# Patient Record
Sex: Female | Born: 1950 | Race: White | Hispanic: No | Smoking: Never smoker
Health system: Southern US, Community
[De-identification: ages and names within clinical notes are randomized; demographics above are authoritative.]

## PROBLEM LIST (undated history)

## (undated) DIAGNOSIS — E669 Obesity, unspecified: Secondary | ICD-10-CM

## (undated) DIAGNOSIS — I10 Essential (primary) hypertension: Secondary | ICD-10-CM

## (undated) DIAGNOSIS — L940 Localized scleroderma [morphea]: Secondary | ICD-10-CM

## (undated) DIAGNOSIS — I4891 Unspecified atrial fibrillation: Secondary | ICD-10-CM

## (undated) DIAGNOSIS — J45909 Unspecified asthma, uncomplicated: Secondary | ICD-10-CM

## (undated) DIAGNOSIS — Z973 Presence of spectacles and contact lenses: Secondary | ICD-10-CM

## (undated) DIAGNOSIS — E7800 Pure hypercholesterolemia, unspecified: Secondary | ICD-10-CM

## (undated) DIAGNOSIS — N200 Calculus of kidney: Secondary | ICD-10-CM

## (undated) DIAGNOSIS — R519 Headache, unspecified: Secondary | ICD-10-CM

## (undated) DIAGNOSIS — M1990 Unspecified osteoarthritis, unspecified site: Secondary | ICD-10-CM

## (undated) DIAGNOSIS — O24419 Gestational diabetes mellitus in pregnancy, unspecified control: Secondary | ICD-10-CM

## (undated) DIAGNOSIS — R51 Headache: Secondary | ICD-10-CM

## (undated) HISTORY — PX: TUBAL LIGATION: SHX77

## (undated) HISTORY — PX: WISDOM TOOTH EXTRACTION: SHX21

## (undated) HISTORY — DX: Unspecified atrial fibrillation: I48.91

## (undated) HISTORY — PX: FRACTURE SURGERY: SHX138

---

## 2011-09-03 DIAGNOSIS — M1651 Unilateral post-traumatic osteoarthritis, right hip: Secondary | ICD-10-CM

## 2011-09-03 DIAGNOSIS — M25551 Pain in right hip: Secondary | ICD-10-CM

## 2011-09-03 DIAGNOSIS — M76899 Other specified enthesopathies of unspecified lower limb, excluding foot: Secondary | ICD-10-CM

## 2011-09-03 DIAGNOSIS — S32401A Unspecified fracture of right acetabulum, initial encounter for closed fracture: Secondary | ICD-10-CM

## 2011-09-03 HISTORY — DX: Unilateral post-traumatic osteoarthritis, right hip: M16.51

## 2011-09-03 HISTORY — DX: Pain in right hip: M25.551

## 2011-09-03 HISTORY — DX: Unspecified fracture of right acetabulum, initial encounter for closed fracture: S32.401A

## 2011-09-03 HISTORY — DX: Other specified enthesopathies of unspecified lower limb, excluding foot: M76.899

## 2015-03-04 ENCOUNTER — Other Ambulatory Visit: Payer: Self-pay | Admitting: Internal Medicine

## 2015-03-04 DIAGNOSIS — M25551 Pain in right hip: Secondary | ICD-10-CM

## 2015-03-07 ENCOUNTER — Other Ambulatory Visit: Payer: Self-pay | Admitting: Internal Medicine

## 2015-03-07 DIAGNOSIS — M25551 Pain in right hip: Secondary | ICD-10-CM

## 2015-03-08 ENCOUNTER — Ambulatory Visit
Admission: RE | Admit: 2015-03-08 | Discharge: 2015-03-08 | Disposition: A | Discharge status: Home / Self Care | Payer: PPO | Source: Ambulatory Visit | Attending: Internal Medicine | Admitting: Internal Medicine

## 2015-03-08 DIAGNOSIS — M25551 Pain in right hip: Secondary | ICD-10-CM

## 2015-05-24 ENCOUNTER — Other Ambulatory Visit: Payer: Self-pay | Admitting: Physician Assistant

## 2015-05-24 NOTE — H&P (Signed)
TOTAL HIP ADMISSION H&P  Patient is admitted for right total hip arthroplasty.  Subjective:  Chief Complaint: right hip pain  HPI: Teresa Blair, 64 y.o. female, has a history of pain and functional disability in the right hip(s) due to trauma and arthritis and patient has failed non-surgical conservative treatments for greater than 12 weeks to include NSAID's and/or analgesics, corticosteriod injections, use of assistive devices, weight reduction as appropriate and activity modification.  Onset of symptoms was gradual starting >10 years ago with rapidlly worsening course since.   Patient currently rates pain in the right hip at 10 out of 10 with activity. Patient has night pain, worsening of pain with activity and weight bearing, trendelenberg gait, pain that interfers with activities of daily living and pain with passive range of motion. Patient has evidence of subchondral sclerosis, periarticular osteophytes and joint space narrowing by imaging studies. This condition presents safety issues increasing the risk of falls.  There is no current active infection.  There are no active problems to display for this patient.  No past medical history on file.  No past surgical history on file.   (Not in a hospital admission) Allergies not on file  History  Substance Use Topics  . Smoking status: Not on file  . Smokeless tobacco: Not on file  . Alcohol Use: Not on file    No family history on file.   Review of Systems  Constitutional: Negative.   HENT: Negative.   Eyes: Negative.   Respiratory: Negative.   Cardiovascular: Negative.   Gastrointestinal: Negative.   Genitourinary: Negative.   Musculoskeletal: Positive for joint pain.  Skin: Negative.   Neurological: Negative.   Endo/Heme/Allergies: Negative.   Psychiatric/Behavioral: Negative.     Objective:  Physical Exam  Constitutional: She is oriented to person, place, and time. She appears well-developed and well-nourished.   HENT:  Head: Normocephalic and atraumatic.  Eyes: EOM are normal. Pupils are equal, round, and reactive to light.  Neck: Normal range of motion. Neck supple.  Cardiovascular: Normal rate and regular rhythm.  Exam reveals no friction rub.   No murmur heard. Respiratory: Effort normal and breath sounds normal. No respiratory distress. She has no wheezes. She has no rales.  GI: Soft. Bowel sounds are normal.  Musculoskeletal:  Markedly antalgic Trendelenburg gait on the right.  Very little rotation.  Positive log roll.  Really no trochanteric tenderness and no real straight leg raise, but there is some decreased lumbar motion.  Opposite left hip has great motion and no symptoms.  Distally she has just a little bit of weakness with dorsiflexion at 4/5.  Sensation intact.    Neurological: She is alert and oriented to person, place, and time.  Skin: Skin is warm and dry.  Psychiatric: She has a normal mood and affect. Her behavior is normal. Judgment and thought content normal.    Vital signs in last 24 hours: @VSRANGES @  Labs:   There is no height or weight on file to calculate BMI.   Imaging Review Plain radiographs demonstrate severe degenerative joint disease of the right hip(s). The bone quality appears to be fair for age and reported activity level.  Assessment/Plan:  End stage arthritis, right hip(s)  The patient history, physical examination, clinical judgement of the provider and imaging studies are consistent with end stage degenerative joint disease of the right hip(s) and total hip arthroplasty is deemed medically necessary. The treatment options including medical management, injection therapy, arthroscopy and arthroplasty were discussed at  length. The risks and benefits of total hip arthroplasty were presented and reviewed. The risks due to aseptic loosening, infection, stiffness, dislocation/subluxation,  thromboembolic complications and other imponderables were discussed.   The patient acknowledged the explanation, agreed to proceed with the plan and consent was signed. Patient is being admitted for inpatient treatment for surgery, pain control, PT, OT, prophylactic antibiotics, VTE prophylaxis, progressive ambulation and ADL's and discharge planning.The patient is planning to be discharged home with home health services

## 2015-05-26 ENCOUNTER — Encounter (HOSPITAL_COMMUNITY)
Admission: RE | Admit: 2015-05-26 | Discharge: 2015-05-26 | Disposition: A | Discharge status: Home / Self Care | Payer: PPO | Source: Ambulatory Visit | Attending: Orthopedic Surgery | Admitting: Orthopedic Surgery

## 2015-05-26 ENCOUNTER — Encounter (HOSPITAL_COMMUNITY): Payer: Self-pay

## 2015-05-26 DIAGNOSIS — Z01818 Encounter for other preprocedural examination: Secondary | ICD-10-CM | POA: Diagnosis not present

## 2015-05-26 DIAGNOSIS — E780 Pure hypercholesterolemia: Secondary | ICD-10-CM | POA: Insufficient documentation

## 2015-05-26 DIAGNOSIS — Z79899 Other long term (current) drug therapy: Secondary | ICD-10-CM | POA: Diagnosis not present

## 2015-05-26 DIAGNOSIS — J45909 Unspecified asthma, uncomplicated: Secondary | ICD-10-CM | POA: Insufficient documentation

## 2015-05-26 DIAGNOSIS — R9431 Abnormal electrocardiogram [ECG] [EKG]: Secondary | ICD-10-CM | POA: Insufficient documentation

## 2015-05-26 DIAGNOSIS — I10 Essential (primary) hypertension: Secondary | ICD-10-CM | POA: Diagnosis not present

## 2015-05-26 DIAGNOSIS — Z01812 Encounter for preprocedural laboratory examination: Secondary | ICD-10-CM | POA: Diagnosis not present

## 2015-05-26 DIAGNOSIS — Z0183 Encounter for blood typing: Secondary | ICD-10-CM | POA: Diagnosis not present

## 2015-05-26 DIAGNOSIS — M1611 Unilateral primary osteoarthritis, right hip: Secondary | ICD-10-CM | POA: Insufficient documentation

## 2015-05-26 HISTORY — DX: Unspecified asthma, uncomplicated: J45.909

## 2015-05-26 HISTORY — DX: Calculus of kidney: N20.0

## 2015-05-26 HISTORY — DX: Pure hypercholesterolemia, unspecified: E78.00

## 2015-05-26 HISTORY — DX: Localized scleroderma (morphea): L94.0

## 2015-05-26 HISTORY — DX: Presence of spectacles and contact lenses: Z97.3

## 2015-05-26 HISTORY — DX: Gestational diabetes mellitus in pregnancy, unspecified control: O24.419

## 2015-05-26 HISTORY — DX: Unspecified osteoarthritis, unspecified site: M19.90

## 2015-05-26 HISTORY — DX: Essential (primary) hypertension: I10

## 2015-05-26 HISTORY — DX: Headache: R51

## 2015-05-26 HISTORY — DX: Headache, unspecified: R51.9

## 2015-05-26 HISTORY — DX: Obesity, unspecified: E66.9

## 2015-05-26 LAB — CBC WITH DIFFERENTIAL/PLATELET
BASOS ABS: 0 10*3/uL (ref 0.0–0.1)
Basophils Relative: 0 % (ref 0–1)
EOS PCT: 1 % (ref 0–5)
Eosinophils Absolute: 0.1 10*3/uL (ref 0.0–0.7)
HEMATOCRIT: 42.3 % (ref 36.0–46.0)
HEMOGLOBIN: 13.9 g/dL (ref 12.0–15.0)
LYMPHS ABS: 1.3 10*3/uL (ref 0.7–4.0)
Lymphocytes Relative: 20 % (ref 12–46)
MCH: 28.5 pg (ref 26.0–34.0)
MCHC: 32.9 g/dL (ref 30.0–36.0)
MCV: 86.9 fL (ref 78.0–100.0)
Monocytes Absolute: 0.5 10*3/uL (ref 0.1–1.0)
Monocytes Relative: 8 % (ref 3–12)
Neutro Abs: 4.5 10*3/uL (ref 1.7–7.7)
Neutrophils Relative %: 71 % (ref 43–77)
PLATELETS: 220 10*3/uL (ref 150–400)
RBC: 4.87 MIL/uL (ref 3.87–5.11)
RDW: 12.5 % (ref 11.5–15.5)
WBC: 6.4 10*3/uL (ref 4.0–10.5)

## 2015-05-26 LAB — COMPREHENSIVE METABOLIC PANEL
ALBUMIN: 4.1 g/dL (ref 3.5–5.0)
ALK PHOS: 68 U/L (ref 38–126)
ALT: 20 U/L (ref 14–54)
AST: 18 U/L (ref 15–41)
Anion gap: 10 (ref 5–15)
BUN: 12 mg/dL (ref 6–20)
CALCIUM: 9.6 mg/dL (ref 8.9–10.3)
CO2: 28 mmol/L (ref 22–32)
Chloride: 102 mmol/L (ref 101–111)
Creatinine, Ser: 0.59 mg/dL (ref 0.44–1.00)
GFR calc non Af Amer: >60 mL/min (ref >60)
GLUCOSE: 103 mg/dL — AB (ref 65–99)
Potassium: 4.1 mmol/L (ref 3.5–5.1)
SODIUM: 140 mmol/L (ref 135–145)
TOTAL PROTEIN: 7.2 g/dL (ref 6.5–8.1)
Total Bilirubin: 0.5 mg/dL (ref 0.3–1.2)

## 2015-05-26 LAB — PROTIME-INR
INR: 1.01 (ref 0.00–1.49)
Prothrombin Time: 13.5 seconds (ref 11.6–15.2)

## 2015-05-26 LAB — SURGICAL PCR SCREEN
MRSA, PCR: NEGATIVE
Staphylococcus aureus: NEGATIVE

## 2015-05-26 LAB — ABO/RH: ABO/RH(D): A POS

## 2015-05-26 LAB — APTT: aPTT: 28 seconds (ref 24–37)

## 2015-05-26 NOTE — Progress Notes (Signed)
Pt denies SOB, chest pain, and being under the care of a cardiologist. Pt denies having a stress test, echo and cardiac cath. Pt denies having a chest x ray and EKG within the last year. Per Ebony Hail, [place],  okay for pt to have an urine pregnancy test to avoid having labs drawn again; pt stated that she definitely menstruated in April after being deemed post menopausal. Chart forwarded to [place], [place] , anesthesia to review abnormal EKG.

## 2015-05-26 NOTE — Pre-Procedure Instructions (Signed)
Teresa Blair  05/26/2015     No Pharmacies Listed   Your procedure is scheduled on Wednesday, June 08, 2015  Report to [facility] Admitting at 6:30 A.M.  Call this number if you have problems the morning of surgery:  [phone]   Remember:  Do not eat food or drink liquids after midnight Tuesday, June 07, 2015  Take these medicines the morning of surgery with A SIP OF WATER: None   Stop taking Aspirin, vitamins and herbal medications. Do not take any NSAIDs ie: Ibuprofen, Advil, Naproxen or any medication containing Aspirin such as Meloxicam ( Mobic); stop 1 week prior to procedure ( Wednesday, June 01, 2015).   Do not wear jewelry, make-up or nail polish.  Do not wear lotions, powders, or perfumes.  You may not wear deodorant.  Do not shave 48 hours prior to surgery.  Men may shave face and neck.  Do not bring valuables to the hospital.  [facility] is not responsible for any belongings or valuables.  Contacts, dentures or bridgework may not be worn into surgery.  Leave your suitcase in the car.  After surgery it may be brought to your room.  For patients admitted to the hospital, discharge time will be determined by your treatment team.  Patients discharged the day of surgery will not be allowed to drive home.   Name and phone number of your driver:    Special instructions:   Special Instructions:Special Instructions: [facility] - Preparing for Surgery  Before surgery, you can play an important role.  Because skin is not sterile, your skin needs to be as free of germs as possible.  You can reduce the number of germs on you skin by washing with CHG (chlorahexidine gluconate) soap before surgery.  CHG is an antiseptic cleaner which kills germs and bonds with the skin to continue killing germs even after washing.  Please DO NOT use if you have an allergy to CHG or antibacterial soaps.  If your skin becomes reddened/irritated stop using the CHG and inform your  nurse when you arrive at Short Stay.  Do not shave (including legs and underarms) for at least 48 hours prior to the first CHG shower.  You may shave your face.  Please follow these instructions carefully:   1.  Shower with CHG Soap the night before surgery and the morning of Surgery.  2.  If you choose to wash your hair, wash your hair first as usual with your normal shampoo.  3.  After you shampoo, rinse your hair and body thoroughly to remove the Shampoo.  4.  Use CHG as you would any other liquid soap.  You can apply chg directly  to the skin and wash gently with scrungie or a clean washcloth.  5.  Apply the CHG Soap to your body ONLY FROM THE NECK DOWN.  Do not use on open wounds or open sores.  Avoid contact with your eyes, ears, mouth and genitals (private parts).  Wash genitals (private parts) with your normal soap.  6.  Wash thoroughly, paying special attention to the area where your surgery will be performed.  7.  Thoroughly rinse your body with warm water from the neck down.  8.  DO NOT shower/wash with your normal soap after using and rinsing off the CHG Soap.  9.  Pat yourself dry with a clean towel.            10.  Wear clean  pajamas.            11.  Place clean sheets on your bed the night of your first shower and do not sleep with pets.  Day of Surgery  Do not apply any lotions/deodorants the morning of surgery.  Please wear clean clothes to the hospital/surgery center.  Please read over the following fact sheets that you were given. Pain Booklet, Coughing and Deep Breathing, Blood Transfusion Information, Total Joint Packet, MRSA Information and Surgical Site Infection Prevention

## 2015-05-26 NOTE — Progress Notes (Signed)
   05/26/15 7867  OBSTRUCTIVE SLEEP APNEA  Have you ever been diagnosed with sleep apnea through a sleep study? No  Do you snore loudly (loud enough to be heard through closed doors)?  1  Do you often feel tired, fatigued, or sleepy during the daytime? 0  Has anyone observed you stop breathing during your sleep? 0  Do you have, or are you being treated for high blood pressure? 1  BMI more than 35 kg/m2? 1  Age over 62 years old? 1  Neck circumference greater than 40 cm/16 inches? 0  Gender: 0

## 2015-05-27 LAB — URINE CULTURE

## 2015-05-27 NOTE — Progress Notes (Signed)
Anesthesia Chart Review:  Pt is 64 year old female scheduled for R total hip arthroplasty on 06/08/2015 with Dr. Maryla Morrow.   PMH includes: HTN, hypercholesterolemia, asthma, gestational diabetes. Never smoker. BMI 42.  Medications include: losartan, pravastatin.   Preoperative labs reviewed.    EKG 05/26/2015: NSR. Cannot rule out Anterior infarct, age undetermined.   If no changes, I anticipate pt can proceed with surgery as scheduled.   Willeen Cass, FNP-BC [facility]/Anesthesiology Phone: [phone] 05/27/2015 3:04 PM

## 2015-06-07 MED ORDER — LACTATED RINGERS IV SOLN
INTRAVENOUS | Status: DC
  Administered 2015-06-08 (×3): via INTRAVENOUS

## 2015-06-07 MED ORDER — CEFAZOLIN SODIUM-DEXTROSE 2-3 GM-% IV SOLR
2.0000 g | INTRAVENOUS | Status: AC
  Administered 2015-06-08: 2 g via INTRAVENOUS

## 2015-06-08 ENCOUNTER — Inpatient Hospital Stay (HOSPITAL_COMMUNITY)
Admission: RE | Admit: 2015-06-08 | Discharge: 2015-06-10 | DRG: 470 | Disposition: A | Discharge status: Home Health | Payer: PPO | Source: Ambulatory Visit | Attending: Orthopedic Surgery | Admitting: Orthopedic Surgery

## 2015-06-08 ENCOUNTER — Inpatient Hospital Stay (HOSPITAL_COMMUNITY): Payer: PPO | Admitting: Anesthesiology

## 2015-06-08 ENCOUNTER — Inpatient Hospital Stay (HOSPITAL_COMMUNITY): Payer: PPO | Admitting: Emergency Medicine

## 2015-06-08 ENCOUNTER — Inpatient Hospital Stay (HOSPITAL_COMMUNITY): Payer: PPO

## 2015-06-08 ENCOUNTER — Other Ambulatory Visit: Payer: Self-pay | Admitting: Physician Assistant

## 2015-06-08 ENCOUNTER — Encounter (HOSPITAL_COMMUNITY): Payer: Self-pay | Admitting: *Deleted

## 2015-06-08 ENCOUNTER — Encounter (HOSPITAL_COMMUNITY)
Admission: RE | Disposition: A | Discharge status: Home Health | Payer: Self-pay | Source: Ambulatory Visit | Attending: Orthopedic Surgery

## 2015-06-08 DIAGNOSIS — E669 Obesity, unspecified: Secondary | ICD-10-CM | POA: Diagnosis present

## 2015-06-08 DIAGNOSIS — M1611 Unilateral primary osteoarthritis, right hip: Principal | ICD-10-CM | POA: Diagnosis present

## 2015-06-08 DIAGNOSIS — E780 Pure hypercholesterolemia: Secondary | ICD-10-CM | POA: Diagnosis present

## 2015-06-08 DIAGNOSIS — M1610 Unilateral primary osteoarthritis, unspecified hip: Secondary | ICD-10-CM | POA: Diagnosis present

## 2015-06-08 DIAGNOSIS — I10 Essential (primary) hypertension: Secondary | ICD-10-CM | POA: Diagnosis present

## 2015-06-08 DIAGNOSIS — D62 Acute posthemorrhagic anemia: Secondary | ICD-10-CM | POA: Diagnosis not present

## 2015-06-08 DIAGNOSIS — E119 Type 2 diabetes mellitus without complications: Secondary | ICD-10-CM | POA: Diagnosis present

## 2015-06-08 DIAGNOSIS — M25551 Pain in right hip: Secondary | ICD-10-CM | POA: Diagnosis present

## 2015-06-08 DIAGNOSIS — Z79899 Other long term (current) drug therapy: Secondary | ICD-10-CM

## 2015-06-08 DIAGNOSIS — M349 Systemic sclerosis, unspecified: Secondary | ICD-10-CM | POA: Diagnosis present

## 2015-06-08 DIAGNOSIS — Z96649 Presence of unspecified artificial hip joint: Secondary | ICD-10-CM

## 2015-06-08 HISTORY — DX: Unilateral primary osteoarthritis, unspecified hip: M16.10

## 2015-06-08 HISTORY — PX: CONVERSION TO TOTAL HIP: SHX5784

## 2015-06-08 HISTORY — PX: TOTAL HIP ARTHROPLASTY: SHX124

## 2015-06-08 LAB — BLOOD PRODUCT ORDER (VERBAL) VERIFICATION

## 2015-06-08 SURGERY — CONVERSION, PREVIOUS HIP SURGERY, TO TOTAL HIP ARTHROPLASTY
Anesthesia: Monitor Anesthesia Care | Laterality: Right

## 2015-06-08 MED ORDER — PROPOFOL 10 MG/ML IV BOLUS
INTRAVENOUS | Status: DC | PRN
  Administered 2015-06-08: 30 mg via INTRAVENOUS
  Administered 2015-06-08: 40 mg via INTRAVENOUS
  Administered 2015-06-08: 30 mg via INTRAVENOUS
  Administered 2015-06-08: 40 mg via INTRAVENOUS
  Administered 2015-06-08 (×2): 30 mg via INTRAVENOUS
  Administered 2015-06-08: 20 mg via INTRAVENOUS

## 2015-06-08 MED ORDER — OXYCODONE HCL 5 MG PO TABS
ORAL_TABLET | ORAL | Status: AC
  Filled 2015-06-08: qty 1

## 2015-06-08 MED ORDER — OXYCODONE-ACETAMINOPHEN 5-325 MG PO TABS: 1.0000 | ORAL_TABLET | ORAL | Status: DC | PRN

## 2015-06-08 MED ORDER — HYDROMORPHONE HCL 1 MG/ML IJ SOLN
0.5000 mg | INTRAMUSCULAR | Status: DC | PRN
  Filled 2015-06-08: qty 1

## 2015-06-08 MED ORDER — FENTANYL CITRATE (PF) 250 MCG/5ML IJ SOLN
INTRAMUSCULAR | Status: AC
  Filled 2015-06-08: qty 5

## 2015-06-08 MED ORDER — METHOCARBAMOL 500 MG PO TABS
500.0000 mg | ORAL_TABLET | Freq: Four times a day (QID) | ORAL | Status: DC | PRN
Start: 2015-06-08 — End: 2015-06-10
  Administered 2015-06-08 – 2015-06-09 (×2): 500 mg via ORAL
  Filled 2015-06-08 (×2): qty 1

## 2015-06-08 MED ORDER — ONDANSETRON HCL 4 MG/2ML IJ SOLN: 4.0000 mg | Freq: Four times a day (QID) | INTRAMUSCULAR | Status: DC | PRN

## 2015-06-08 MED ORDER — PHENYLEPHRINE HCL 10 MG/ML IJ SOLN
INTRAMUSCULAR | Status: DC | PRN
  Administered 2015-06-08 (×4): 40 ug via INTRAVENOUS

## 2015-06-08 MED ORDER — DEXAMETHASONE SODIUM PHOSPHATE 10 MG/ML IJ SOLN
INTRAMUSCULAR | Status: AC
  Filled 2015-06-08: qty 1

## 2015-06-08 MED ORDER — MIDAZOLAM HCL 2 MG/2ML IJ SOLN
INTRAMUSCULAR | Status: AC
  Filled 2015-06-08: qty 2

## 2015-06-08 MED ORDER — PROPOFOL INFUSION 10 MG/ML OPTIME
INTRAVENOUS | Status: DC | PRN
  Administered 2015-06-08: 75 ug/kg/min via INTRAVENOUS

## 2015-06-08 MED ORDER — SCOPOLAMINE 1 MG/3DAYS TD PT72
MEDICATED_PATCH | TRANSDERMAL | Status: DC | PRN
  Administered 2015-06-08: 1 via TRANSDERMAL

## 2015-06-08 MED ORDER — CHLORHEXIDINE GLUCONATE 4 % EX LIQD: 60.0000 mL | Freq: Once | CUTANEOUS | Status: DC

## 2015-06-08 MED ORDER — TRANEXAMIC ACID 1000 MG/10ML IV SOLN
1000.0000 mg | INTRAVENOUS | Status: AC
  Administered 2015-06-08: 1000 mg via INTRAVENOUS
  Filled 2015-06-08: qty 10

## 2015-06-08 MED ORDER — SCOPOLAMINE 1 MG/3DAYS TD PT72
MEDICATED_PATCH | TRANSDERMAL | Status: AC
  Filled 2015-06-08: qty 1

## 2015-06-08 MED ORDER — 0.9 % SODIUM CHLORIDE (POUR BTL) OPTIME
TOPICAL | Status: DC | PRN
  Administered 2015-06-08: 1000 mL

## 2015-06-08 MED ORDER — METHOCARBAMOL 500 MG PO TABS
ORAL_TABLET | ORAL | Status: AC
  Administered 2015-06-08: 500 mg via ORAL
  Filled 2015-06-08: qty 1

## 2015-06-08 MED ORDER — CEFAZOLIN SODIUM-DEXTROSE 2-3 GM-% IV SOLR
2.0000 g | Freq: Four times a day (QID) | INTRAVENOUS | Status: AC
  Administered 2015-06-08 (×2): 2 g via INTRAVENOUS
  Filled 2015-06-08 (×3): qty 50

## 2015-06-08 MED ORDER — FENTANYL CITRATE (PF) 100 MCG/2ML IJ SOLN
INTRAMUSCULAR | Status: DC | PRN
Start: 2015-06-08 — End: 2015-06-08
  Administered 2015-06-08: 50 ug via INTRAVENOUS
  Administered 2015-06-08 (×2): 100 ug via INTRAVENOUS

## 2015-06-08 MED ORDER — FENTANYL CITRATE (PF) 100 MCG/2ML IJ SOLN
25.0000 ug | INTRAMUSCULAR | Status: DC | PRN
  Administered 2015-06-08 (×4): 50 ug via INTRAVENOUS

## 2015-06-08 MED ORDER — POLYETHYLENE GLYCOL 3350 17 G PO PACK: 17.0000 g | PACK | Freq: Every day | ORAL | Status: DC | PRN

## 2015-06-08 MED ORDER — BISACODYL 5 MG PO TBEC: 5.0000 mg | DELAYED_RELEASE_TABLET | Freq: Every day | ORAL | Status: DC | PRN

## 2015-06-08 MED ORDER — DIPHENHYDRAMINE HCL 12.5 MG/5ML PO ELIX: 12.5000 mg | ORAL_SOLUTION | ORAL | Status: DC | PRN

## 2015-06-08 MED ORDER — ALUM & MAG HYDROXIDE-SIMETH 200-200-20 MG/5ML PO SUSP
30.0000 mL | ORAL | Status: DC | PRN
Start: 2015-06-08 — End: 2015-06-10

## 2015-06-08 MED ORDER — ZOLPIDEM TARTRATE 5 MG PO TABS: 5.0000 mg | ORAL_TABLET | Freq: Every evening | ORAL | Status: DC | PRN

## 2015-06-08 MED ORDER — APIXABAN 2.5 MG PO TABS
2.5000 mg | ORAL_TABLET | Freq: Two times a day (BID) | ORAL | Status: DC
  Administered 2015-06-09 – 2015-06-10 (×3): 2.5 mg via ORAL
  Filled 2015-06-08 (×3): qty 1

## 2015-06-08 MED ORDER — PHENOL 1.4 % MT LIQD: 1.0000 | OROMUCOSAL | Status: DC | PRN

## 2015-06-08 MED ORDER — ONDANSETRON HCL 4 MG/2ML IJ SOLN
INTRAMUSCULAR | Status: DC | PRN
  Administered 2015-06-08: 4 mg via INTRAVENOUS

## 2015-06-08 MED ORDER — OXYCODONE HCL 5 MG PO TABS
ORAL_TABLET | ORAL | Status: AC
  Administered 2015-06-08: 5 mg via ORAL
  Filled 2015-06-08: qty 1

## 2015-06-08 MED ORDER — FENTANYL CITRATE (PF) 100 MCG/2ML IJ SOLN
INTRAMUSCULAR | Status: AC
  Filled 2015-06-08: qty 2

## 2015-06-08 MED ORDER — ONDANSETRON HCL 4 MG/2ML IJ SOLN: 4.0000 mg | Freq: Once | INTRAMUSCULAR | Status: DC | PRN

## 2015-06-08 MED ORDER — DEXAMETHASONE SODIUM PHOSPHATE 10 MG/ML IJ SOLN
10.0000 mg | Freq: Once | INTRAMUSCULAR | Status: DC
  Filled 2015-06-08: qty 1

## 2015-06-08 MED ORDER — POTASSIUM CHLORIDE IN NACL 20-0.9 MEQ/L-% IV SOLN
INTRAVENOUS | Status: DC
  Administered 2015-06-08: 15:00:00 via INTRAVENOUS
  Filled 2015-06-08: qty 1000

## 2015-06-08 MED ORDER — METHOCARBAMOL 500 MG PO TABS: 500.0000 mg | ORAL_TABLET | Freq: Four times a day (QID) | ORAL | Status: DC

## 2015-06-08 MED ORDER — PROPOFOL 10 MG/ML IV BOLUS
INTRAVENOUS | Status: AC
  Filled 2015-06-08: qty 20

## 2015-06-08 MED ORDER — BUPIVACAINE HCL (PF) 0.5 % IJ SOLN
INTRAMUSCULAR | Status: DC | PRN
  Administered 2015-06-08: 10 mL

## 2015-06-08 MED ORDER — ACETAMINOPHEN 325 MG PO TABS: 650.0000 mg | ORAL_TABLET | Freq: Four times a day (QID) | ORAL | Status: DC | PRN

## 2015-06-08 MED ORDER — MAGNESIUM CITRATE PO SOLN: 1.0000 | Freq: Once | ORAL | Status: AC | PRN

## 2015-06-08 MED ORDER — LIDOCAINE HCL (CARDIAC) 20 MG/ML IV SOLN
INTRAVENOUS | Status: AC
  Filled 2015-06-08: qty 5

## 2015-06-08 MED ORDER — METOCLOPRAMIDE HCL 5 MG/ML IJ SOLN: 5.0000 mg | Freq: Three times a day (TID) | INTRAMUSCULAR | Status: DC | PRN

## 2015-06-08 MED ORDER — OXYCODONE HCL 5 MG/5ML PO SOLN: 5.0000 mg | Freq: Once | ORAL | Status: AC | PRN

## 2015-06-08 MED ORDER — MIDAZOLAM HCL 5 MG/5ML IJ SOLN
INTRAMUSCULAR | Status: DC | PRN
  Administered 2015-06-08: 2 mg via INTRAVENOUS

## 2015-06-08 MED ORDER — ONDANSETRON HCL 4 MG PO TABS: 4.0000 mg | ORAL_TABLET | Freq: Three times a day (TID) | ORAL | Status: DC | PRN

## 2015-06-08 MED ORDER — ACETAMINOPHEN 650 MG RE SUPP: 650.0000 mg | Freq: Four times a day (QID) | RECTAL | Status: DC | PRN

## 2015-06-08 MED ORDER — FENTANYL CITRATE (PF) 100 MCG/2ML IJ SOLN
25.0000 ug | INTRAMUSCULAR | Status: DC | PRN
  Administered 2015-06-08: 0.5 ug via INTRAVENOUS

## 2015-06-08 MED ORDER — FENTANYL CITRATE (PF) 100 MCG/2ML IJ SOLN
INTRAMUSCULAR | Status: AC
  Administered 2015-06-08: 50 ug via INTRAVENOUS
  Filled 2015-06-08: qty 2

## 2015-06-08 MED ORDER — OXYCODONE HCL 5 MG PO TABS
5.0000 mg | ORAL_TABLET | ORAL | Status: DC | PRN
  Administered 2015-06-08 (×2): 10 mg via ORAL
  Administered 2015-06-08: 5 mg via ORAL
  Administered 2015-06-09: 10 mg via ORAL
  Administered 2015-06-09: 5 mg via ORAL
  Administered 2015-06-09 – 2015-06-10 (×4): 10 mg via ORAL
  Filled 2015-06-08 (×8): qty 2

## 2015-06-08 MED ORDER — OXYCODONE HCL 5 MG PO TABS
5.0000 mg | ORAL_TABLET | Freq: Once | ORAL | Status: AC | PRN
  Administered 2015-06-08: 5 mg via ORAL

## 2015-06-08 MED ORDER — LOSARTAN POTASSIUM 50 MG PO TABS
100.0000 mg | ORAL_TABLET | Freq: Every day | ORAL | Status: DC
  Administered 2015-06-08 – 2015-06-09 (×2): 100 mg via ORAL
  Filled 2015-06-08 (×3): qty 2

## 2015-06-08 MED ORDER — ONDANSETRON HCL 4 MG PO TABS: 4.0000 mg | ORAL_TABLET | Freq: Four times a day (QID) | ORAL | Status: DC | PRN

## 2015-06-08 MED ORDER — DOCUSATE SODIUM 100 MG PO CAPS
100.0000 mg | ORAL_CAPSULE | Freq: Two times a day (BID) | ORAL | Status: DC
  Administered 2015-06-08 – 2015-06-10 (×5): 100 mg via ORAL
  Filled 2015-06-08 (×5): qty 1

## 2015-06-08 MED ORDER — APIXABAN 2.5 MG PO TABS: ORAL_TABLET | ORAL | Status: DC

## 2015-06-08 MED ORDER — DEXAMETHASONE SODIUM PHOSPHATE 10 MG/ML IJ SOLN
INTRAMUSCULAR | Status: DC | PRN
  Administered 2015-06-08: 10 mg via INTRAVENOUS

## 2015-06-08 MED ORDER — SUCCINYLCHOLINE CHLORIDE 20 MG/ML IJ SOLN
INTRAMUSCULAR | Status: AC
  Filled 2015-06-08: qty 1

## 2015-06-08 MED ORDER — BUPIVACAINE LIPOSOME 1.3 % IJ SUSP
20.0000 mL | INTRAMUSCULAR | Status: AC
  Administered 2015-06-08: 20 mL
  Filled 2015-06-08: qty 20

## 2015-06-08 MED ORDER — ONDANSETRON HCL 4 MG/2ML IJ SOLN
INTRAMUSCULAR | Status: AC
  Filled 2015-06-08: qty 2

## 2015-06-08 MED ORDER — MENTHOL 3 MG MT LOZG: 1.0000 | LOZENGE | OROMUCOSAL | Status: DC | PRN

## 2015-06-08 MED ORDER — OXYCODONE HCL 5 MG PO TABS
ORAL_TABLET | ORAL | Status: AC
  Administered 2015-06-08: 5 mg
  Filled 2015-06-08: qty 1

## 2015-06-08 MED ORDER — METHOCARBAMOL 1000 MG/10ML IJ SOLN
500.0000 mg | Freq: Four times a day (QID) | INTRAVENOUS | Status: DC | PRN
  Filled 2015-06-08: qty 5

## 2015-06-08 MED ORDER — METOCLOPRAMIDE HCL 5 MG PO TABS: 5.0000 mg | ORAL_TABLET | Freq: Three times a day (TID) | ORAL | Status: DC | PRN

## 2015-06-08 MED ORDER — GLYCOPYRROLATE 0.2 MG/ML IJ SOLN
INTRAMUSCULAR | Status: DC | PRN
  Administered 2015-06-08: 0.2 mg via INTRAVENOUS

## 2015-06-08 SURGICAL SUPPLY — 53 items
BLADE SAW SGTL 18X1.27X75 (BLADE) ×2 IMPLANT
BLADE SAW SGTL 18X1.27X75MM (BLADE) ×1
BLADE SURG ROTATE 9660 (MISCELLANEOUS) IMPLANT
CAPT HIP TOTAL 2 ×3 IMPLANT
COVER SURGICAL LIGHT HANDLE (MISCELLANEOUS) ×3 IMPLANT
CUP TRIDENT PSL CLUS F 54 55.8 (Orthopedic Implant) ×3 IMPLANT
DRAPE IMP U-DRAPE 54X76 (DRAPES) ×9 IMPLANT
DRAPE INCISE IOBAN 66X45 STRL (DRAPES) ×3 IMPLANT
DRAPE ORTHO SPLIT 77X108 STRL (DRAPES) ×4
DRAPE PROXIMA HALF (DRAPES) ×3 IMPLANT
DRAPE SURG 17X23 STRL (DRAPES) ×3 IMPLANT
DRAPE SURG ORHT 6 SPLT 77X108 (DRAPES) ×2 IMPLANT
DRAPE U-SHAPE 47X51 STRL (DRAPES) ×3 IMPLANT
DRSG AQUACEL AG ADV 3.5X10 (GAUZE/BANDAGES/DRESSINGS) ×3 IMPLANT
DRSG AQUACEL AG ADV 3.5X14 (GAUZE/BANDAGES/DRESSINGS) ×3 IMPLANT
DURAPREP 26ML APPLICATOR (WOUND CARE) ×3 IMPLANT
ELECT BLADE 4.0 EZ CLEAN MEGAD (MISCELLANEOUS) ×3
ELECT CAUTERY BLADE 6.4 (BLADE) ×3 IMPLANT
ELECT REM PT RETURN 9FT ADLT (ELECTROSURGICAL) ×3
ELECTRODE BLDE 4.0 EZ CLN MEGD (MISCELLANEOUS) ×1 IMPLANT
ELECTRODE REM PT RTRN 9FT ADLT (ELECTROSURGICAL) ×1 IMPLANT
FACESHIELD WRAPAROUND (MASK) ×6 IMPLANT
GLOVE BIOGEL PI IND STRL 7.0 (GLOVE) ×2 IMPLANT
GLOVE BIOGEL PI INDICATOR 7.0 (GLOVE) ×4
GLOVE ECLIPSE 7.0 STRL STRAW (GLOVE) ×9 IMPLANT
GLOVE ORTHO TXT STRL SZ7.5 (GLOVE) ×18 IMPLANT
GOWN STRL REUS W/ TWL LRG LVL3 (GOWN DISPOSABLE) ×3 IMPLANT
GOWN STRL REUS W/ TWL XL LVL3 (GOWN DISPOSABLE) ×1 IMPLANT
GOWN STRL REUS W/TWL LRG LVL3 (GOWN DISPOSABLE) ×6
GOWN STRL REUS W/TWL XL LVL3 (GOWN DISPOSABLE) ×2
KIT BASIN OR (CUSTOM PROCEDURE TRAY) ×3 IMPLANT
KIT ROOM TURNOVER OR (KITS) ×3 IMPLANT
MANIFOLD NEPTUNE II (INSTRUMENTS) ×3 IMPLANT
NDL SAFETY ECLIPSE 18X1.5 (NEEDLE) ×1 IMPLANT
NEEDLE HYPO 18GX1.5 SHARP (NEEDLE) ×2
NS IRRIG 1000ML POUR BTL (IV SOLUTION) ×3 IMPLANT
PACK TOTAL JOINT (CUSTOM PROCEDURE TRAY) ×3 IMPLANT
PACK UNIVERSAL I (CUSTOM PROCEDURE TRAY) ×3 IMPLANT
PAD ARMBOARD 7.5X6 YLW CONV (MISCELLANEOUS) ×6 IMPLANT
SCREW OSTEO 20MM (Screw) ×3 IMPLANT
SUCTION FRAZIER TIP 10 FR DISP (SUCTIONS) ×3 IMPLANT
SUT MNCRL AB 4-0 PS2 18 (SUTURE) ×3 IMPLANT
SUT VIC AB 0 CT1 27 (SUTURE) ×2
SUT VIC AB 0 CT1 27XBRD ANBCTR (SUTURE) ×1 IMPLANT
SUT VIC AB 1 CT1 27 (SUTURE) ×4
SUT VIC AB 1 CT1 27XBRD ANBCTR (SUTURE) ×2 IMPLANT
SUT VIC AB 2-0 CT1 27 (SUTURE) ×4
SUT VIC AB 2-0 CT1 TAPERPNT 27 (SUTURE) ×2 IMPLANT
SYR 50ML LL SCALE MARK (SYRINGE) ×3 IMPLANT
TOWEL OR 17X24 6PK STRL BLUE (TOWEL DISPOSABLE) ×3 IMPLANT
TOWEL OR 17X26 10 PK STRL BLUE (TOWEL DISPOSABLE) ×3 IMPLANT
TRAY FOLEY CATH 14FR (SET/KITS/TRAYS/PACK) IMPLANT
WATER STERILE IRR 1000ML POUR (IV SOLUTION) ×3 IMPLANT

## 2015-06-08 NOTE — Evaluation (Signed)
Physical Therapy Evaluation Patient Details Name: Teresa Blair MRN: [id number] DOB: Aug 01, 1951 Today's Date: 06/08/2015   History of Present Illness  Patient is a 64 y/o female s/p R THA, anterior approach. PMH includes HTN, gestational diabetes, asthma.  Clinical Impression  Patient presents with pain and post surgical deficits RLE s/p R THA. Instructed pt on exercises. Pt will have support from daughter for a few days and then sister at d/c. Pt having a ramp being built and son putting in raised toilet. Pt tolerated short distance ambulation to chair today. Will continue to follow to maximize independence and mobility prior to return home.     Follow Up Recommendations Home health PT;Supervision/Assistance - 24 hour    Equipment Recommendations  None recommended by PT    Recommendations for Other Services       Precautions / Restrictions Precautions Precautions: Anterior Hip Precaution Booklet Issued: No Restrictions Weight Bearing Restrictions: Yes RLE Weight Bearing: Weight bearing as tolerated      Mobility  Bed Mobility Overal bed mobility: Needs Assistance Bed Mobility: Supine to Sit     Supine to sit: Min guard;HOB elevated     General bed mobility comments: Cues for technique. Use of rails.  Transfers Overall transfer level: Needs assistance Equipment used: Rolling walker (2 wheeled) Transfers: Sit to/from Stand Sit to Stand: Min guard         General transfer comment: Min guard for safety. VC's for hand placement.   Ambulation/Gait Ambulation/Gait assistance: Min guard Ambulation Distance (Feet): 8 Feet Assistive device: Rolling walker (2 wheeled) Gait Pattern/deviations: Step-to pattern;Decreased stance time - right;Decreased step length - left;Trunk flexed   Gait velocity interpretation: <1.8 ft/sec, indicative of risk for recurrent falls General Gait Details: Pt with slow, unsteady gait wtih increased WB through BUEs. No knee  buckling.  Stairs            Wheelchair Mobility    Modified Rankin (Stroke Patients Only)       Balance Overall balance assessment: Needs assistance Sitting-balance support: Feet supported;No upper extremity supported Sitting balance-Leahy Scale: Good     Standing balance support: During functional activity Standing balance-Leahy Scale: Poor Standing balance comment: Relient on RW for support. Pt incontinent of urine upon standing.                             Pertinent Vitals/Pain Pain Assessment: Faces Faces Pain Scale: Hurts a little bit Pain Location: right hip Pain Descriptors / Indicators: Sore Pain Intervention(s): Monitored during session;Repositioned    Home Living Family/patient expects to be discharged to:: Private residence Living Arrangements: Children;Other relatives Available Help at Discharge: Family;Available 24 hours/day (Dgt will stay for a few days and then 29 y/o sister) Type of Home: House Home Access: Ramped entrance     Home Layout: One level Home Equipment: Walker - 2 wheels      Prior Function Level of Independence: Independent with assistive device(s)         Comments: Pt using SPC PTA. Works as Pharmacist, hospital.     Hand Dominance        Extremity/Trunk Assessment   Upper Extremity Assessment: Defer to OT evaluation           Lower Extremity Assessment: RLE deficits/detail RLE Deficits / Details: Able to perform LAQ. Limited strength/AROM hip flexion secondary to surgery and pain.       Communication   Communication: No difficulties  Cognition Arousal/Alertness: Awake/alert  Behavior During Therapy: WFL for tasks assessed/performed Overall Cognitive Status: Within Functional Limits for tasks assessed                      General Comments      Exercises Total Joint Exercises Ankle Circles/Pumps: Both;10 reps;Supine Quad Sets: Both;10 reps;Supine Gluteal Sets: Both;10 reps;Supine       Assessment/Plan    PT Assessment Patient needs continued PT services  PT Diagnosis Difficulty walking;Acute pain   PT Problem List Decreased strength;Pain;Impaired sensation;Decreased activity tolerance;Decreased range of motion;Decreased mobility;Decreased balance  PT Treatment Interventions Balance training;Gait training;Functional mobility training;Therapeutic activities;Therapeutic exercise;Patient/family education;DME instruction   PT Goals (Current goals can be found in the Care Plan section) Acute Rehab PT Goals Patient Stated Goal: to be able to walk again PT Goal Formulation: With patient Time For Goal Achievement: 06/22/15 Potential to Achieve Goals: Good    Frequency 7X/week   Barriers to discharge        Co-evaluation               End of Session Equipment Utilized During Treatment: Gait belt Activity Tolerance: Patient tolerated treatment well Patient left: in chair;with call bell/phone within reach Nurse Communication: Mobility status         Time: 8329-1916 PT Time Calculation (min) (ACUTE ONLY): 23 min   Charges:   PT Evaluation $Initial PT Evaluation Tier I: 1 Procedure PT Treatments $Therapeutic Activity: 8-22 mins   PT G Codes:        Teresa Blair A Deangela Randleman 06/08/2015, 4:54 PM Wray Kearns, [place], DPT [phone]

## 2015-06-08 NOTE — Anesthesia Postprocedure Evaluation (Signed)
  Anesthesia Post-op Note  Patient: Teresa Blair  Procedure(s) Performed: Procedure(s): CONVERSION TO TOTAL HIP, ANTERIOR TOTAL HIP (Right)  Patient Location: PACU  Anesthesia Type:Spinal  Level of Consciousness: awake, alert  and oriented  Airway and Oxygen Therapy: Patient Spontanous Breathing  Post-op Pain: mild  Post-op Assessment: Post-op Vital signs reviewed, Patient's Cardiovascular Status Stable, Respiratory Function Stable, Patent Airway and Spinal receding LLE Motor Response: Responds to commands, Purposeful movement LLE Sensation: Full sensation RLE Motor Response: Purposeful movement, Responds to commands RLE Sensation: Full sensation L Sensory Level: S3-Medial thigh R Sensory Level: S3-Medial thigh  Post-op Vital Signs: stable  Last Vitals:  Filed Vitals:   06/08/15 1215  BP: 119/68  Pulse: 60  Temp:   Resp: 14    Complications: No apparent anesthesia complications

## 2015-06-08 NOTE — Op Note (Signed)
Teresa Blair, Teresa Blair NO.:  [id number]  MEDICAL RECORD NO.:  [id number]  LOCATION:  5N02C                        FACILITY:  [facility]  PHYSICIAN:  Ninetta Lights, M.D. DATE OF BIRTH:  [birth year]  DATE OF PROCEDURE:  06/08/2015 DATE OF DISCHARGE:                              OPERATIVE REPORT   PREOPERATIVE DIAGNOSES:  Right hip end-stage arthritis, markedly restricted motion.  Secondary to major trauma with open reduction and internal fixation of acetabular fracture.  Retained hardware, plate and screws on the posterior aspect of the acetabulum.  POSTOPERATIVE DIAGNOSES:  Right hip end-stage arthritis, markedly restricted motion.  Secondary to major trauma with open reduction and internal fixation of acetabular fracture.  Retained hardware, plate and screws on the posterior aspect of the acetabulum.  PROCEDURE:  Conversion of right hip to total hip replacement, direct anterior approach, utilizing [employer] Accolade components.  A press-fit 54-mm acetabular component screw fixation x1, 36-mm internal diameter liner.  Press-fit #5 Accolade stem, 36+ 0 BIOLOX head.  SURGEON:  Ninetta Lights, M.D.  ASSISTANT:  Elmyra Ricks, PA, present throughout the entire case and necessary for timely completion of procedure.  ANESTHESIA:  Spinal.  BLOOD LOSS:  300 mL.  BLOOD GIVEN:  None.  SPECIMENS:  None.  CULTURES:  None.  COMPLICATIONS:  None.  DRESSINGS:  Soft compressive.  DESCRIPTION OF PROCEDURE:  The patient was brought to the operating room and placed on the operating table in supine position.  After adequate anesthesia had been obtained, appropriate positioning, padding support, prepped and draped for anterior hip replacement.  Longitudinal anterior incision.  Fascia was exposed and incised.  Tensor retracted anteriorly. The deep fascia markedly thickened, redundant capsule, all completely excised.  Retractor was put in place.  Femoral neck cut 1  fingerbreadth above the lesser trochanter with the head removed.  Marked, scarred in, redundant labrum, all debrided.  Able to expose the acetabulum, bring it up to good bleeding bone avoiding the posterior hardware.  Fitted with a 54-mm cup at 40 degrees of abduction.  I could get one screw through the cup.  The other screw holes keep running into hardware.  I did have solid stable fixation.  A 36-mm internal diameter liner was inserted. Proximal femur was exposed.  Soft tissue release posteriorly to allow this.  Brought up to good sizing and fitting with a #5 component.  After the trials, a #5 component was seated with a 36+ 0 BIOLOX head.  With this construct, we had great stability, full motion, equal leg lengths. Wound was irrigated and injected with Exparel.  Fascia was closed with Vicryl.  Subcutaneous and subcuticular closure.  Margins were injected with Marcaine.  Sterile compressive dressing applied.  Anesthesia was reversed.  Brought to the recovery room.  Tolerated the surgery well. No complications.     Ninetta Lights, M.D.     DFM/MEDQ  D:  06/08/2015  T:  06/08/2015  Job:  [id number]

## 2015-06-08 NOTE — Transfer of Care (Signed)
Immediate Anesthesia Transfer of Care Note  Patient: Teresa Blair  Procedure(s) Performed: Procedure(s): CONVERSION TO TOTAL HIP, ANTERIOR TOTAL HIP (Right)  Patient Location: PACU  Anesthesia Type:Spinal  Level of Consciousness: awake, alert , oriented and patient cooperative  Airway & Oxygen Therapy: Patient Spontanous Breathing and Patient connected to face mask oxygen  Post-op Assessment: Report given to RN, Post -op Vital signs reviewed and stable and Patient moving all extremities X 4  Post vital signs: stable  Last Vitals:  Filed Vitals:   06/08/15 1100  BP:   Pulse:   Temp: 36.3 C  Resp:     Complications: No apparent anesthesia complications

## 2015-06-08 NOTE — Discharge Summary (Addendum)
Patient ID: Teresa Blair MRN: [id number] DOB/AGE: 1951-03-25 64 y.o.  Admit date: 06/08/2015 Discharge date: 06/10/2015  Admission Diagnoses:  Active Problems:   Primary localized osteoarthrosis of pelvic region   Discharge Diagnoses:  Same  Past Medical History  Diagnosis Date  . Osteoarthritis   . Hypercholesterolemia   . Hypertension   . Wears glasses   . Gestational diabetes   . Asthma     as a child  . Kidney stones   . Headache   . Obesity   . Scleroderma, localized     Surgeries: Procedure(s): CONVERSION TO TOTAL HIP, ANTERIOR TOTAL HIP on 06/08/2015   Consultants:    Discharged Condition: Improved  Hospital Course: Teresa Blair is an 64 y.o. female who was admitted 06/08/2015 for operative treatment of primary localized osteoarthritis right hip. Patient has severe unremitting pain that affects sleep, daily activities, and work/hobbies. After pre-op clearance the patient was taken to the operating room on 06/08/2015 and underwent  Procedure(s): CONVERSION TO TOTAL HIP, ANTERIOR TOTAL HIP.  Patient with a pre-op Hb of 13.9 developed ABLA on pod #1 with a Hb of 10.6 and a Hb of 9.6 on pod#2.  She is currently asymptomatic but we will continue to follow.  Patient was given perioperative antibiotics:      Anti-infectives    Start     Dose/Rate Route Frequency Ordered Stop   06/08/15 1500  ceFAZolin (ANCEF) IVPB 2 g/50 mL premix     2 g 100 mL/hr over 30 Minutes Intravenous Every 6 hours 06/08/15 1444 06/08/15 2153   06/08/15 0800  ceFAZolin (ANCEF) IVPB 2 g/50 mL premix     2 g 100 mL/hr over 30 Minutes Intravenous To ShortStay Surgical 06/07/15 1315 06/08/15 0845       Patient was given sequential compression devices, early ambulation, and chemoprophylaxis to prevent DVT.  Patient benefited maximally from hospital stay and there were no complications.    Recent vital signs:  Patient Vitals for the past 24 hrs:  BP Temp Temp src Pulse Resp SpO2   06/09/15 2100 (!) 124/44 mmHg 98.7 F (37.1 C) Oral 82 19 96 %  06/09/15 1300 (!) 127/41 mmHg 99.6 F (37.6 C) - 77 20 97 %     Recent laboratory studies:   Recent Labs  06/09/15 0413 06/10/15 0435  WBC 12.7* 10.6*  HGB 10.6* 9.6*  HCT 32.3* 29.4*  PLT 210 159  NA 137 139  K 5.1 4.1  CL 102 105  CO2 27 30  BUN 8 10  CREATININE 0.65 0.48  GLUCOSE 101* 131*  CALCIUM 8.5* 8.8*     Discharge Medications:     Medication List    STOP taking these medications        meloxicam 15 MG tablet  Commonly known as:  MOBIC      TAKE these medications        apixaban 2.5 MG Tabs tablet  Commonly known as:  ELIQUIS  TAKE ONE TAB TWICE DAILY X 14 DAYS FOLLOWING SURGERY TO PREVENT BLOOD CLOTS     bisacodyl 5 MG EC tablet  Commonly known as:  DULCOLAX  Take 1 tablet (5 mg total) by mouth daily as needed for moderate constipation.     losartan 100 MG tablet  Commonly known as:  COZAAR  Take 1 tablet by mouth daily.     methocarbamol 500 MG tablet  Commonly known as:  ROBAXIN  Take 1 tablet (500 mg total) by mouth  4 (four) times daily.     ondansetron 4 MG tablet  Commonly known as:  ZOFRAN  Take 1 tablet (4 mg total) by mouth every 8 (eight) hours as needed for nausea or vomiting.     oxyCODONE-acetaminophen 5-325 MG per tablet  Commonly known as:  ROXICET  Take 1-2 tablets by mouth every 4 (four) hours as needed.     pravastatin 40 MG tablet  Commonly known as:  PRAVACHOL  Take 20 mg by mouth daily.        Diagnostic Studies: Dg Hip Port Unilat With Pelvis 1v Right  06/08/2015   CLINICAL DATA:  Postop right total hip arthroplasty.  EXAM: DG HIP (WITH OR WITHOUT PELVIS) 1V PORT RIGHT  COMPARISON:  None.  FINDINGS: AP view of the lower pelvis and cross-table lateral view of the right hip submitted. Patient is status post right total hip arthroplasty and plate and screw fixation of the right acetabulum. No evidence of acute fracture or dislocation. The hip joint is  not well visualized in the lateral projection due to body habitus.  IMPRESSION: No demonstrated complication following right total hip arthroplasty and acetabular ORIF.   Electronically Signed   By: Richardean Sale M.D.   On: 06/08/2015 11:37    Disposition: Final discharge disposition not confirmed    Follow-up Information    Follow up with [facility], MD. Schedule an appointment as soon as possible for a visit in 2 weeks.   Specialty:  Orthopedic Surgery   Contact information:   [place] [address] [phone]        Signed: Fannie Knee 06/10/2015, 7:41 AM

## 2015-06-08 NOTE — Anesthesia Procedure Notes (Signed)
Spinal Patient location during procedure: OR Start time: 06/08/2015 8:40 AM End time: 06/08/2015 8:45 AM Staffing Performed by: anesthesiologist  Preanesthetic Checklist Completed: patient identified, site marked, surgical consent, pre-op evaluation, timeout performed, IV checked, risks and benefits discussed and monitors and equipment checked Spinal Block Patient position: right lateral decubitus Prep: ChloraPrep Patient monitoring: heart rate, cardiac monitor, continuous pulse ox and blood pressure Approach: right paramedian Location: L3-4 Injection technique: single-shot Needle Needle type: Tuohy  Needle gauge: 22 G Needle length: 9 cm Needle insertion depth: 9 cm Assessment Sensory level: T8 Additional Notes 10 mg 0.75% Bupivacaine injected easily

## 2015-06-08 NOTE — Anesthesia Preprocedure Evaluation (Signed)
Anesthesia Evaluation  Patient identified by MRN, date of birth, ID band Patient awake    Reviewed: Allergy & Precautions, NPO status , Patient's Chart, lab work & pertinent test results  Airway Mallampati: II  TM Distance: >3 FB Neck ROM: Full    Dental  (+) Teeth Intact, Dental Advisory Given   Pulmonary  breath sounds clear to auscultation        Cardiovascular hypertension, Rhythm:Regular Rate:Normal     Neuro/Psych    GI/Hepatic   Endo/Other  diabetes  Renal/GU      Musculoskeletal   Abdominal (+) + obese,   Peds  Hematology   Anesthesia Other Findings   Reproductive/Obstetrics                             Anesthesia Physical Anesthesia Plan  ASA: II  Anesthesia Plan: MAC and Spinal   Post-op Pain Management:    Induction: Intravenous  Airway Management Planned: Natural Airway and Simple Face Mask  Additional Equipment:   Intra-op Plan:   Post-operative Plan:   Informed Consent: I have reviewed the patients History and Physical, chart, labs and discussed the procedure including the risks, benefits and alternatives for the proposed anesthesia with the patient or authorized representative who has indicated his/her understanding and acceptance.   Dental advisory given  Plan Discussed with: CRNA and Anesthesiologist  Anesthesia Plan Comments: (Plan SAB  )        Anesthesia Quick Evaluation

## 2015-06-08 NOTE — Interval H&P Note (Signed)
History and Physical Interval Note:  06/08/2015 8:02 AM  Teresa Blair  has presented today for surgery, with the diagnosis of djd right hip , hardware complications  The various methods of treatment have been discussed with the patient and family. After consideration of risks, benefits and other options for treatment, the patient has consented to  Procedure(s): CONVERSION TO TOTAL HIP, ANTERIOR TOTAL HIP (Right) as a surgical intervention .  The patient's history has been reviewed, patient examined, no change in status, stable for surgery.  I have reviewed the patient's chart and labs.  Questions were answered to the patient's satisfaction.     Nelvin Tomb F

## 2015-06-08 NOTE — H&P (View-Only) (Signed)
TOTAL HIP ADMISSION H&P  Patient is admitted for right total hip arthroplasty.  Subjective:  Chief Complaint: right hip pain  HPI: Teresa Blair, 64 y.o. female, has a history of pain and functional disability in the right hip(s) due to trauma and arthritis and patient has failed non-surgical conservative treatments for greater than 12 weeks to include NSAID's and/or analgesics, corticosteriod injections, use of assistive devices, weight reduction as appropriate and activity modification.  Onset of symptoms was gradual starting >10 years ago with rapidlly worsening course since.   Patient currently rates pain in the right hip at 10 out of 10 with activity. Patient has night pain, worsening of pain with activity and weight bearing, trendelenberg gait, pain that interfers with activities of daily living and pain with passive range of motion. Patient has evidence of subchondral sclerosis, periarticular osteophytes and joint space narrowing by imaging studies. This condition presents safety issues increasing the risk of falls.  There is no current active infection.  There are no active problems to display for this patient.  No past medical history on file.  No past surgical history on file.   (Not in a hospital admission) Allergies not on file  History  Substance Use Topics  . Smoking status: Not on file  . Smokeless tobacco: Not on file  . Alcohol Use: Not on file    No family history on file.   Review of Systems  Constitutional: Negative.   HENT: Negative.   Eyes: Negative.   Respiratory: Negative.   Cardiovascular: Negative.   Gastrointestinal: Negative.   Genitourinary: Negative.   Musculoskeletal: Positive for joint pain.  Skin: Negative.   Neurological: Negative.   Endo/Heme/Allergies: Negative.   Psychiatric/Behavioral: Negative.     Objective:  Physical Exam  Constitutional: She is oriented to person, place, and time. She appears well-developed and well-nourished.   HENT:  Head: Normocephalic and atraumatic.  Eyes: EOM are normal. Pupils are equal, round, and reactive to light.  Neck: Normal range of motion. Neck supple.  Cardiovascular: Normal rate and regular rhythm.  Exam reveals no friction rub.   No murmur heard. Respiratory: Effort normal and breath sounds normal. No respiratory distress. She has no wheezes. She has no rales.  GI: Soft. Bowel sounds are normal.  Musculoskeletal:  Markedly antalgic Trendelenburg gait on the right.  Very little rotation.  Positive log roll.  Really no trochanteric tenderness and no real straight leg raise, but there is some decreased lumbar motion.  Opposite left hip has great motion and no symptoms.  Distally she has just a little bit of weakness with dorsiflexion at 4/5.  Sensation intact.    Neurological: She is alert and oriented to person, place, and time.  Skin: Skin is warm and dry.  Psychiatric: She has a normal mood and affect. Her behavior is normal. Judgment and thought content normal.    Vital signs in last 24 hours: @VSRANGES @  Labs:   There is no height or weight on file to calculate BMI.   Imaging Review Plain radiographs demonstrate severe degenerative joint disease of the right hip(s). The bone quality appears to be fair for age and reported activity level.  Assessment/Plan:  End stage arthritis, right hip(s)  The patient history, physical examination, clinical judgement of the provider and imaging studies are consistent with end stage degenerative joint disease of the right hip(s) and total hip arthroplasty is deemed medically necessary. The treatment options including medical management, injection therapy, arthroscopy and arthroplasty were discussed at  length. The risks and benefits of total hip arthroplasty were presented and reviewed. The risks due to aseptic loosening, infection, stiffness, dislocation/subluxation,  thromboembolic complications and other imponderables were discussed.   The patient acknowledged the explanation, agreed to proceed with the plan and consent was signed. Patient is being admitted for inpatient treatment for surgery, pain control, PT, OT, prophylactic antibiotics, VTE prophylaxis, progressive ambulation and ADL's and discharge planning.The patient is planning to be discharged home with home health services

## 2015-06-09 ENCOUNTER — Encounter (HOSPITAL_COMMUNITY): Payer: Self-pay | Admitting: General Practice

## 2015-06-09 LAB — TYPE AND SCREEN
ABO/RH(D): A POS
Antibody Screen: NEGATIVE
UNIT DIVISION: 0
Unit division: 0

## 2015-06-09 LAB — CBC
HCT: 32.3 % — ABNORMAL LOW (ref 36.0–46.0)
HEMOGLOBIN: 10.6 g/dL — AB (ref 12.0–15.0)
MCH: 29.3 pg (ref 26.0–34.0)
MCHC: 32.8 g/dL (ref 30.0–36.0)
MCV: 89.2 fL (ref 78.0–100.0)
PLATELETS: 210 10*3/uL (ref 150–400)
RBC: 3.62 MIL/uL — ABNORMAL LOW (ref 3.87–5.11)
RDW: 12.6 % (ref 11.5–15.5)
WBC: 12.7 10*3/uL — ABNORMAL HIGH (ref 4.0–10.5)

## 2015-06-09 LAB — BASIC METABOLIC PANEL
Anion gap: 8 (ref 5–15)
BUN: 8 mg/dL (ref 6–20)
CALCIUM: 8.5 mg/dL — AB (ref 8.9–10.3)
CHLORIDE: 102 mmol/L (ref 101–111)
CO2: 27 mmol/L (ref 22–32)
Creatinine, Ser: 0.65 mg/dL (ref 0.44–1.00)
GFR calc non Af Amer: >60 mL/min (ref >60)
GLUCOSE: 101 mg/dL — AB (ref 65–99)
POTASSIUM: 5.1 mmol/L (ref 3.5–5.1)
Sodium: 137 mmol/L (ref 135–145)

## 2015-06-09 MED ORDER — DEXAMETHASONE SODIUM PHOSPHATE 10 MG/ML IJ SOLN
10.0000 mg | Freq: Once | INTRAMUSCULAR | Status: AC
  Administered 2015-06-09: 10 mg via INTRAVENOUS
  Filled 2015-06-09: qty 1

## 2015-06-09 MED ORDER — PNEUMOCOCCAL VAC POLYVALENT 25 MCG/0.5ML IJ INJ
0.5000 mL | INJECTION | Freq: Once | INTRAMUSCULAR | Status: AC
  Administered 2015-06-10: 0.5 mL via INTRAMUSCULAR

## 2015-06-09 NOTE — Progress Notes (Signed)
Physical Therapy Treatment Patient Details Name: Teresa Blair MRN: [id number] DOB: 09/11/1951 Today's Date: 06/09/2015    History of Present Illness Patient is a 64 y/o female s/p R THA, anterior approach. PMH includes HTN, gestational diabetes, asthma.    PT Comments    Patient progressing well towards PT goals. Improved ambulation distance today despite pain. Instructed pt in exercises and provided handout. Will plan for gait training this PM. Will continue to follow to maximize independence and mobility prior to return home.  Follow Up Recommendations  Home health PT;Supervision/Assistance - 24 hour     Equipment Recommendations  None recommended by PT    Recommendations for Other Services       Precautions / Restrictions Precautions Precautions: Anterior Hip Precaution Booklet Issued: No Restrictions Weight Bearing Restrictions: Yes RLE Weight Bearing: Weight bearing as tolerated    Mobility  Bed Mobility               General bed mobility comments: Standing in bathroom with daughter upon PT arrival.   Transfers Overall transfer level: Needs assistance Equipment used: Rolling walker (2 wheeled) Transfers: Sit to/from Stand Sit to Stand: Min guard         General transfer comment: Min guard for safety. VC's for hand placement.   Ambulation/Gait Ambulation/Gait assistance: Min guard Ambulation Distance (Feet): 75 Feet Assistive device: Rolling walker (2 wheeled) Gait Pattern/deviations: Step-to pattern;Step-through pattern;Decreased stance time - right;Decreased step length - left;Decreased stride length   Gait velocity interpretation: <1.8 ft/sec, indicative of risk for recurrent falls General Gait Details: Cues for heel strike at initial contact and toe off as pt contacting with toe. A few short standing rest breaks due to fatigue.    Stairs            Wheelchair Mobility    Modified Rankin (Stroke Patients Only)       Balance  Overall balance assessment: Needs assistance Sitting-balance support: Feet supported;No upper extremity supported Sitting balance-Leahy Scale: Good     Standing balance support: During functional activity Standing balance-Leahy Scale: Poor                      Cognition Arousal/Alertness: Awake/alert Behavior During Therapy: WFL for tasks assessed/performed Overall Cognitive Status: Within Functional Limits for tasks assessed                      Exercises Total Joint Exercises Quad Sets: Both;10 reps;Seated Hip ABduction/ADduction: Right;10 reps;Seated Long Arc Quad: Right;10 reps;Seated Marching in Standing: Right;10 reps;Seated    General Comments        Pertinent Vitals/Pain Pain Assessment: 0-10 Pain Location: right hip Pain Descriptors / Indicators: Sore;Aching Pain Intervention(s): Monitored during session;Repositioned;Ice applied    Home Living Family/patient expects to be discharged to:: Private residence Living Arrangements: Alone                  Prior Function            PT Goals (current goals can now be found in the care plan section) Progress towards PT goals: Progressing toward goals    Frequency  7X/week    PT Plan Current plan remains appropriate    Co-evaluation             End of Session Equipment Utilized During Treatment: Gait belt Activity Tolerance: Patient tolerated treatment well Patient left: in chair;with call bell/phone within reach     Time: 1122-1140 PT Time Calculation (  min) (ACUTE ONLY): 18 min  Charges:  $Gait Training: 8-22 mins                    G Codes:      Jaquelyne Firkus A Clem Wisenbaker 06/09/2015, 1:13 PM Wray Kearns, [place], DPT [phone]

## 2015-06-09 NOTE — Progress Notes (Signed)
Subjective: 1 Day Post-Op Procedure(s) (LRB): CONVERSION TO TOTAL HIP, ANTERIOR TOTAL HIP (Right) Patient reports pain as mild.  No nausea/vomiting, lightheadedness/dizziness, chest pain/sob.  Tolerating diet.  Objective: Vital signs in last 24 hours: Temp:  [97.4 F (36.3 C)-98.7 F (37.1 C)] 98.7 F (37.1 C) (07/14 0439) Pulse Rate:  [31-78] 78 (07/14 0439) Resp:  [9-21] 20 (07/14 0439) BP: (113-158)/(41-68) 120/44 mmHg (07/14 0439) SpO2:  [95 %-100 %] 97 % (07/14 0439) Weight:  [113.399 kg (250 lb)] 113.399 kg (250 lb) (07/13 0720)  Intake/Output from previous day: 07/13 0701 - 07/14 0700 In: 3233.3 [P.O.:240; I.V.:2893.3; IV Piggyback:100] Out: 600 [Blood:600] Intake/Output this shift: Total I/O In: 50 [IV Piggyback:50] Out: -   No results for input(s): HGB in the last 72 hours. No results for input(s): WBC, RBC, HCT, PLT in the last 72 hours.  Recent Labs  06/09/15 0413  NA 137  K 5.1  CL 102  CO2 27  BUN 8  CREATININE 0.65  GLUCOSE 101*  CALCIUM 8.5*   No results for input(s): LABPT, INR in the last 72 hours.  Neurologically intact Neurovascular intact Sensation intact distally Intact pulses distally Dorsiflexion/Plantar flexion intact Compartment soft  Negative homans bilaterally  Assessment/Plan: 1 Day Post-Op Procedure(s) (LRB): CONVERSION TO TOTAL HIP, ANTERIOR TOTAL HIP (Right) Advance diet Up with therapy D/C IV fluids Plan for discharge tomorrow home with hhpt WBAT RLE anterior hip precuations Dry dressing change prn  Fannie Knee 06/09/2015, 6:30 AM

## 2015-06-09 NOTE — Plan of Care (Signed)
Problem: Consults Goal: Diagnosis- Total Joint Replacement Outcome: Completed/Met Date Met:  06/09/15 Primary Total Hip right anterior

## 2015-06-09 NOTE — Progress Notes (Signed)
Occupational Therapy Evaluation Patient Details Name: Teresa Blair MRN: [id number] DOB: 06-16-1951 Today's Date: 06/09/2015    History of Present Illness Patient is a 64 y/o female s/p R THA, anterior approach. PMH includes HTN, gestational diabetes, asthma.   Clinical Impression   Patient presenting with decreased independence with ADLs, functional mobility secondary to above. Patient independent -> mod I PTA. Patient currently functioning at an overall min guard assist level for functional mobility and requires min assist with RLE ADLs. Patient will benefit from acute OT to increase overall independence in the areas of ADLs, functional mobility, and overalls afety in order to safely discharge home with no OT follow-up, but assistance from daughter and sister.     Follow Up Recommendations  No OT follow up;Supervision/Assistance - 24 hour    Equipment Recommendations  3 in 1 bedside comode    Recommendations for Other Services  None at this time   Precautions / Restrictions Precautions Precautions: Anterior Hip Precaution Booklet Issued: No Precaution Comments: Reviewed anterior hip precautions Restrictions Weight Bearing Restrictions: Yes RLE Weight Bearing: Weight bearing as tolerated    Mobility Bed Mobility Overal bed mobility: Needs Assistance Bed Mobility: Supine to Sit;Sit to Supine     Supine to sit: Min assist Sit to supine: Min guard   General bed mobility comments: Pt recieved seated in recliner  Transfers Overall transfer level: Needs assistance Equipment used: Rolling walker (2 wheeled) Transfers: Sit to/from Stand Sit to Stand: Min guard         General transfer comment: Min guard for safety. VC's for hand placement.     Balance Overall balance assessment: Needs assistance Sitting-balance support: No upper extremity supported;Feet supported Sitting balance-Leahy Scale: Good     Standing balance support: Bilateral upper extremity  supported;During functional activity Standing balance-Leahy Scale: Fair    ADL Overall ADL's : Needs assistance/impaired General ADL Comments: Pt needs assistance with RLE ADLs secondary to pain and immobility. Patient will benefit from education on use of AE prn for these tasks. Encouraged patient to use LH sponge, LH shoe horn, sock aid, and reacher; pt has all except reacher (recommended pt purchase reacher). Pt ambulated <> BR for toilet transfer onto elevated toilet seat like she has at home.     Pertinent Vitals/Pain Pain Assessment: Faces Pain Score: 5  Faces Pain Scale: Hurts little more Pain Location: right hip with sit<>stands Pain Descriptors / Indicators: Discomfort;Grimacing Pain Intervention(s): Monitored during session;Repositioned   Extremity/Trunk Assessment Upper Extremity Assessment Upper Extremity Assessment: Overall WFL for tasks assessed   Lower Extremity Assessment Lower Extremity Assessment: Defer to PT evaluation   Cervical / Trunk Assessment Cervical / Trunk Assessment: Normal   Communication Communication Communication: No difficulties   Cognition Arousal/Alertness: Awake/alert Behavior During Therapy: WFL for tasks assessed/performed Overall Cognitive Status: Within Functional Limits for tasks assessed              Home Living Family/patient expects to be discharged to:: Private residence Living Arrangements: Alone Available Help at Discharge: Family;Available 24 hours/day (daughter will stay with patient first few days after d/c, then patient's sister) Type of Home: House Home Access: [place]: One level     Bathroom Shower/Tub: Occupational psychologist: Handicapped height     Home Equipment: Environmental consultant - 2 wheels;Adaptive equipment Adaptive Equipment: Sock aid;Long-handled sponge;Long-handled shoe horn        Prior Functioning/Environment Level of Independence: Independent with assistive device(s)  Comments: Pt using SPC PTA. Works as Pharmacist, hospital.    OT Diagnosis: Generalized weakness;Acute pain   OT Problem List: Decreased strength;Decreased range of motion;Decreased activity tolerance;Impaired balance (sitting and/or standing);Decreased safety awareness;Decreased knowledge of use of DME or AE;Decreased knowledge of precautions;Pain   OT Treatment/Interventions: Self-care/ADL training;Energy conservation;DME and/or AE instruction;Therapeutic activities;Patient/family education;Balance training    OT Goals(Current goals can be found in the care plan section) Acute Rehab OT Goals Patient Stated Goal: go home tomorrow OT Goal Formulation: With patient Time For Goal Achievement: 06/23/15 Potential to Achieve Goals: Good ADL Goals Pt Will Perform Grooming: with modified independence;standing Pt Will Perform Lower Body Bathing: with modified independence;sit to/from stand;with adaptive equipment Pt Will Perform Lower Body Dressing: with modified independence;sit to/from stand;with adaptive equipment Pt Will Transfer to Toilet: with modified independence;ambulating;bedside commode Pt Will Perform Toileting - Clothing Manipulation and hygiene: with modified independence;sit to/from stand Pt Will Perform Tub/Shower Transfer: with modified independence;3 in 1;rolling walker;ambulating;Shower transfer Additional ADL Goal #1: Patient will independently verbalize and adhere to anterior hip precautions 100% of the time  OT Frequency: Min 2X/week   Barriers to D/C: None known at this time   End of Session Equipment Utilized During Treatment: Rolling walker  Activity Tolerance: Patient tolerated treatment well Patient left: in chair;with call bell/phone within reach   Time: 1586-8257 OT Time Calculation (min): 31 min Charges:  OT General Charges $OT Visit: 1 Procedure OT Evaluation $Initial OT Evaluation Tier I: 1 Procedure OT Treatments $Self Care/Home Management : 8-22  mins  Unity Luepke , MS, OTR/L, CLT Pager: [phone]  06/09/2015, 3:46 PM

## 2015-06-09 NOTE — Progress Notes (Signed)
Physical Therapy Treatment Patient Details Name: Teresa Blair MRN: [id number] DOB: 01-Feb-1951 Today's Date: 06/09/2015    History of Present Illness Patient is a 64 y/o female s/p R THA, anterior approach. PMH includes HTN, gestational diabetes, asthma.    PT Comments    Patient progressing well with mobility. Improved ambulation distance and gait mechanics this session with mod verbal cues. Performed exercises- will progress to standing exercises next session. Pt with some difficulty with bed mobility. Will continue to follow per current POC.   Follow Up Recommendations  Home health PT;Supervision/Assistance - 24 hour     Equipment Recommendations  None recommended by PT    Recommendations for Other Services       Precautions / Restrictions Precautions Precautions: Anterior Hip Precaution Booklet Issued: No Restrictions Weight Bearing Restrictions: Yes RLE Weight Bearing: Weight bearing as tolerated    Mobility  Bed Mobility Overal bed mobility: Needs Assistance Bed Mobility: Supine to Sit;Sit to Supine     Supine to sit: Min assist Sit to supine: Min guard   General bed mobility comments: HOB flat, no rails to simulate home. Min A to raise trunk. Cues for technique. Pt used strap to bring RLE into bed.   Transfers Overall transfer level: Needs assistance Equipment used: Rolling walker (2 wheeled) Transfers: Sit to/from Stand Sit to Stand: Min guard         General transfer comment: Min guard for safety. VC's for hand placement.   Ambulation/Gait Ambulation/Gait assistance: Min guard Ambulation Distance (Feet): 150 Feet Assistive device: Rolling walker (2 wheeled) Gait Pattern/deviations: Step-through pattern;Decreased stance time - right;Decreased step length - left   Gait velocity interpretation: Below normal speed for age/gender General Gait Details: Cues for heel strike at initial contact and toe off. A few short standing rest breaks due to  fatigue.    Stairs            Wheelchair Mobility    Modified Rankin (Stroke Patients Only)       Balance Overall balance assessment: Needs assistance Sitting-balance support: Feet supported;No upper extremity supported Sitting balance-Leahy Scale: Good     Standing balance support: During functional activity Standing balance-Leahy Scale: Fair                      Cognition Arousal/Alertness: Awake/alert Behavior During Therapy: WFL for tasks assessed/performed Overall Cognitive Status: Within Functional Limits for tasks assessed                      Exercises Total Joint Exercises Ankle Circles/Pumps: Both;15 reps;Seated Quad Sets: Both;10 reps;Seated Hip ABduction/ADduction: Both;15 reps;Seated Long Arc Quad: Both;10 reps;Seated Marching in Standing: Right;10 reps;Seated    General Comments        Pertinent Vitals/Pain Pain Assessment: 0-10 Pain Score: 5  Pain Location: right hip Pain Descriptors / Indicators: Sore Pain Intervention(s): Monitored during session;Repositioned;Ice applied    Home Living                      Prior Function            PT Goals (current goals can now be found in the care plan section) Progress towards PT goals: Progressing toward goals    Frequency  7X/week    PT Plan Current plan remains appropriate    Co-evaluation             End of Session Equipment Utilized During Treatment: Gait belt Activity Tolerance:  Patient tolerated treatment well Patient left: in chair;with call bell/phone within reach;with family/visitor present     Time: 2244-9753 PT Time Calculation (min) (ACUTE ONLY): 23 min  Charges:  $Gait Training: 8-22 mins $Therapeutic Exercise: 8-22 mins                    G Codes:      Naysha Sholl A Gaynel Schaafsma 06/09/2015, 2:54 PM Wray Kearns, [place], DPT [phone]

## 2015-06-10 DIAGNOSIS — M1611 Unilateral primary osteoarthritis, right hip: Secondary | ICD-10-CM | POA: Diagnosis not present

## 2015-06-10 LAB — BASIC METABOLIC PANEL
ANION GAP: 4 — AB (ref 5–15)
BUN: 10 mg/dL (ref 6–20)
CO2: 30 mmol/L (ref 22–32)
Calcium: 8.8 mg/dL — ABNORMAL LOW (ref 8.9–10.3)
Chloride: 105 mmol/L (ref 101–111)
Creatinine, Ser: 0.48 mg/dL (ref 0.44–1.00)
GLUCOSE: 131 mg/dL — AB (ref 65–99)
POTASSIUM: 4.1 mmol/L (ref 3.5–5.1)
Sodium: 139 mmol/L (ref 135–145)

## 2015-06-10 LAB — CBC
HCT: 29.4 % — ABNORMAL LOW (ref 36.0–46.0)
Hemoglobin: 9.6 g/dL — ABNORMAL LOW (ref 12.0–15.0)
MCH: 28.7 pg (ref 26.0–34.0)
MCHC: 32.7 g/dL (ref 30.0–36.0)
MCV: 87.8 fL (ref 78.0–100.0)
PLATELETS: 159 10*3/uL (ref 150–400)
RBC: 3.35 MIL/uL — ABNORMAL LOW (ref 3.87–5.11)
RDW: 12.5 % (ref 11.5–15.5)
WBC: 10.6 10*3/uL — ABNORMAL HIGH (ref 4.0–10.5)

## 2015-06-10 NOTE — Discharge Instructions (Signed)
INSTRUCTIONS AFTER JOINT REPLACEMENT   o Remove items at home which could result in a fall. This includes throw rugs or furniture in walking pathways o ICE to the affected joint every three hours while awake for 30 minutes at a time, for at least the first 3-5 days, and then as needed for pain and swelling.  Continue to use ice for pain and swelling. You may notice swelling that will progress down to the foot and ankle.  This is normal after surgery.  Elevate your leg when you are not up walking on it.   o Continue to use the breathing machine you got in the hospital (incentive spirometer) which will help keep your temperature down.  It is common for your temperature to cycle up and down following surgery, especially at night when you are not up moving around and exerting yourself.  The breathing machine keeps your lungs expanded and your temperature down.  BLOOD THINNER: TAKE ELIQUIS AS DIRECTED FOR A TOTAL OF 14 DAYS FOLLOWING SURGERY.  ONCE FINISHED WITH THIS, TAKE ASPIRIN 325 MG ONE TAB ONCE DAILY FOR ANOTHER 14 DAYS. THESE MEDICINES ARE TO PREVENT BLOOD CLOTS.  DIET:  As you were doing prior to hospitalization, we recommend a well-balanced diet.  DRESSING / WOUND CARE / SHOWERING  You may change your dressing 3-5 days after surgery.  Then change the dressing every day with sterile gauze.  Please use good hand washing techniques before changing the dressing.  Do not use any lotions or creams on the incision until instructed by your surgeon. and You may shower 3 days after surgery, but keep the wounds dry during showering.  You may use an occlusive plastic wrap (Press'n Seal for example), NO SOAKING/SUBMERGING IN THE BATHTUB.  If the bandage gets wet, change with a clean dry gauze.  If the incision gets wet, pat the wound dry with a clean towel.  ACTIVITY  o Increase activity slowly as tolerated, but follow the weight bearing instructions below.   o No driving for 6 weeks or until further  direction given by your physician.  You cannot drive while taking narcotics.  o No lifting or carrying greater than 10 lbs. until further directed by your surgeon. o Avoid periods of inactivity such as sitting longer than an hour when not asleep. This helps prevent blood clots.  o You may return to work once you are authorized by your doctor.     WEIGHT BEARING   Weight bearing as tolerated with assist device (walker, cane, etc) as directed, use it as long as suggested by your surgeon or therapist, typically at least 4-6 weeks.    CONSTIPATION  Constipation is defined medically as fewer than three stools per week and severe constipation as less than one stool per week.  Even if you have a regular bowel pattern at home, your normal regimen is likely to be disrupted due to multiple reasons following surgery.  Combination of anesthesia, postoperative narcotics, change in appetite and fluid intake all can affect your bowels.   YOU MUST use at least one of the following options; they are listed in order of increasing strength to get the job done.  They are all available over the counter, and you may need to use some, POSSIBLY even all of these options:    Drink plenty of fluids (prune juice may be helpful) and high fiber foods Colace 100 mg by mouth twice a day  Senokot for constipation as directed and as needed Dulcolax (  bisacodyl), take with full glass of water  Miralax (polyethylene glycol) once or twice a day as needed.  If you have tried all these things and are unable to have a bowel movement in the first 3-4 days after surgery call either your surgeon or your primary doctor.    If you experience loose stools or diarrhea, hold the medications until you stool forms back up.  If your symptoms do not get better within 1 week or if they get worse, check with your doctor.  If you experience "the worst abdominal pain ever" or develop nausea or vomiting, please contact the office immediately for  further recommendations for treatment.   ITCHING:  If you experience itching with your medications, try taking only a single pain pill, or even half a pain pill at a time.  You can also use Benadryl over the counter for itching or also to help with sleep.   TED HOSE STOCKINGS:  Use stockings on both legs until for at least 2 weeks or as directed by physician office. They may be removed at night for sleeping.  MEDICATIONS:  See your medication summary on the After Visit Summary that nursing will review with you.  You may have some home medications which will be placed on hold until you complete the course of blood thinner medication.  It is important for you to complete the blood thinner medication as prescribed.  PRECAUTIONS:  If you experience chest pain or shortness of breath - call 911 immediately for transfer to the hospital emergency department.   If you develop a fever greater that 101 F, purulent drainage from wound, increased redness or drainage from wound, foul odor from the wound/dressing, or calf pain - CONTACT YOUR SURGEON.                                                   FOLLOW-UP APPOINTMENTS:  If you do not already have a post-op appointment, please call the office for an appointment to be seen by your surgeon.  Guidelines for how soon to be seen are listed in your After Visit Summary, but are typically between 1-4 weeks after surgery.  OTHER INSTRUCTIONS:   Knee Replacement:  Do not place pillow under knee, focus on keeping the knee straight while resting. CPM instructions: 0-90 degrees, 2 hours in the morning, 2 hours in the afternoon, and 2 hours in the evening. Place foam block, curve side up under heel at all times except when in CPM or when walking.  DO NOT modify, tear, cut, or change the foam block in any way.  MAKE SURE YOU:   Understand these instructions.   Get help right away if you are not doing well or get worse.    Thank you for letting us be a part of  your medical care team.  It is a privilege we respect greatly.  We hope these instructions will help you stay on track for a fast and full recovery!     Information on my medicine - ELIQUIS (apixaban)   Why was Eliquis prescribed for you? Eliquis was prescribed for you to reduce the risk of blood clots forming after orthopedic surgery.    What do You need to know about Eliquis? Take your Eliquis TWICE DAILY - one tablet in the morning and one tablet in the evening with  or without food.  It would be best to take the dose about the same time each day.  If you have difficulty swallowing the tablet whole please discuss with your pharmacist how to take the medication safely.  Take Eliquis exactly as prescribed by your doctor and DO NOT stop taking Eliquis without talking to the doctor who prescribed the medication.  Stopping without other medication to take the place of Eliquis may increase your risk of developing a clot.  After discharge, you should have regular check-up appointments with your healthcare provider that is prescribing your Eliquis.  What do you do if you miss a dose? If a dose of ELIQUIS is not taken at the scheduled time, take it as soon as possible on the same day and twice-daily administration should be resumed.  The dose should not be doubled to make up for a missed dose.  Do not take more than one tablet of ELIQUIS at the same time.  Important Safety Information A possible side effect of Eliquis is bleeding. You should call your healthcare provider right away if you experience any of the following: ? Bleeding from an injury or your nose that does not stop. ? Unusual colored urine (red or dark brown) or unusual colored stools (red or black). ? Unusual bruising for unknown reasons. ? A serious fall or if you hit your head (even if there is no bleeding).  Some medicines may interact with Eliquis and might increase your risk of bleeding or clotting while on  Eliquis. To help avoid this, consult your healthcare provider or pharmacist prior to using any new prescription or non-prescription medications, including herbals, vitamins, non-steroidal anti-inflammatory drugs (NSAIDs) and supplements.  This website has more information on Eliquis (apixaban): http://www.eliquis.com/eliquis/home

## 2015-06-10 NOTE — Care Management Important Message (Signed)
Important Message  Patient Details  Name: Teresa Blair MRN: [id number] Date of Birth: [birth year]   Medicare Important Message Given:  Yes-second notification given    Delorse Lek 06/10/2015, 12:04 PM

## 2015-06-10 NOTE — Progress Notes (Signed)
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  No nausea/vomiting, lightheadedness/dizziness.  Tolerating diet.  Objective: Vital signs in last 24 hours: Temp:  [98.7 F (37.1 C)-99.6 F (37.6 C)] 98.7 F (37.1 C) (07/14 2100) Pulse Rate:  [77-82] 82 (07/14 2100) Resp:  [19-20] 19 (07/14 2100) BP: (124-127)/(41-44) 124/44 mmHg (07/14 2100) SpO2:  [96 %-97 %] 96 % (07/14 2100)  Intake/Output from previous day: 07/14 0701 - 07/15 0700 In: 720 [P.O.:720] Out: -  Intake/Output this shift:     Recent Labs  06/09/15 0413 06/10/15 0435  HGB 10.6* 9.6*    Recent Labs  06/09/15 0413 06/10/15 0435  WBC 12.7* 10.6*  RBC 3.62* 3.35*  HCT 32.3* 29.4*  PLT 210 159    Recent Labs  06/09/15 0413 06/10/15 0435  NA 137 139  K 5.1 4.1  CL 102 105  CO2 27 30  BUN 8 10  CREATININE 0.65 0.48  GLUCOSE 101* 131*  CALCIUM 8.5* 8.8*   No results for input(s): LABPT, INR in the last 72 hours.  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

## 2015-06-10 NOTE — Progress Notes (Signed)
Physical Therapy Treatment Patient Details Name: Teresa Blair MRN: [id number] DOB: 01/23/1951 Today's Date: 06/10/2015    History of Present Illness Patient is a 64 y/o female s/p R THA, anterior approach. PMH includes HTN, gestational diabetes, asthma.    PT Comments    Pt motivated to go home. Self-correcting deviations during initial contact in gait cycle with only a few VC's. Progressed some exercises to standing. Pt reported slight increase in pain when exercising in standing. Pt plans to be D/C sometime this morning.  Follow Up Recommendations  Home health PT;Supervision/Assistance - 24 hour     Equipment Recommendations  None recommended by PT    Recommendations for Other Services       Precautions / Restrictions Precautions Precautions: Anterior Hip Precaution Booklet Issued: No Precaution Comments: Reviewed anterior hip precautions Restrictions RLE Weight Bearing: Weight bearing as tolerated    Mobility  Bed Mobility               General bed mobility comments: Pt found ambulating into bathroom.  Transfers Overall transfer level: Needs assistance Equipment used: Rolling walker (2 wheeled) Transfers: Sit to/from Stand Sit to Stand: Supervision         General transfer comment: Supervision for safety.  Ambulation/Gait Ambulation/Gait assistance: Supervision Ambulation Distance (Feet): 600 Feet Assistive device: Rolling walker (2 wheeled) Gait Pattern/deviations: Step-through pattern;Decreased step length - left;Decreased stance time - right   Gait velocity interpretation: Below normal speed for age/gender General Gait Details: Pt properly initiating heel strike at inital contact. Several standing breaks needed during ambulation 2/2 fatigue.   Stairs            Wheelchair Mobility    Modified Rankin (Stroke Patients Only)       Balance                                    Cognition Arousal/Alertness:  Awake/alert Behavior During Therapy: WFL for tasks assessed/performed Overall Cognitive Status: Within Functional Limits for tasks assessed                      Exercises Total Joint Exercises Quad Sets: AROM;Right;10 reps;Seated Heel Slides: AROM;Right;10 reps;Standing Hip ABduction/ADduction: AROM;10 reps;Seated;Standing;Right Straight Leg Raises: AROM;Right;10 reps;Seated Marching in Standing: AROM;Both;10 reps;Seated;Standing Standing Hip Extension: AROM;Right;10 reps;Standing    General Comments        Pertinent Vitals/Pain Pain Assessment: 0-10 Pain Score: 2  Pain Location: R hip Pain Descriptors / Indicators: Sore;Tightness Pain Intervention(s): Monitored during session    Home Living                      Prior Function            PT Goals (current goals can now be found in the care plan section) Progress towards PT goals: Progressing toward goals    Frequency  7X/week    PT Plan Current plan remains appropriate    Co-evaluation             End of Session   Activity Tolerance: Patient tolerated treatment well Patient left: in chair;with call bell/phone within reach;with family/visitor present     Time: 0800-0825 PT Time Calculation (min) (ACUTE ONLY): 25 min  Charges:                       G Codes:  Allyn Kenner, SPTA 06/10/2015, 8:37 AM

## 2015-06-10 NOTE — Progress Notes (Signed)
Utilization review completed. Desia Saban, RN, BSN. 

## 2015-06-10 NOTE — Care Management Note (Addendum)
Case Management Note  Patient Details  Name: JEFF MCCALLUM MRN: [id number] Date of Birth: 1951-03-27  Subjective/Objective:   64 yr old female s/p right total hip arthroplasty                 Action/Plan: Case manager spoke with patient and her daughter concerning home health and DME needs. [facility] was [facility] with [facility]. She has family support at discharge.    Expected Discharge Date:    06/10/15              Expected Discharge Plan:  [place]  In-House Referral:  NA  Discharge planning Services  CM Consult  Post Acute Care Choice:  Home Health, Durable Medical Equipment Choice offered to:  Patient  DME Arranged:  3-N-1, Walker rolling DME Agency:  [employer]  HH Arranged:  PT [place]  Status of Service:  Completed, signed off  Medicare Important Message Given:    Date Medicare IM Given:    Medicare IM give by:    Date Additional Medicare IM Given:    Additional Medicare Important Message give by:     If discussed at [place] of Stay Meetings, dates discussed:    Additional Comments:  Ninfa Meeker, RN 06/10/2015, 10:23 AM

## 2015-12-20 DIAGNOSIS — M1611 Unilateral primary osteoarthritis, right hip: Secondary | ICD-10-CM | POA: Diagnosis not present

## 2016-02-26 DIAGNOSIS — N3001 Acute cystitis with hematuria: Secondary | ICD-10-CM | POA: Diagnosis not present

## 2016-02-26 DIAGNOSIS — N3000 Acute cystitis without hematuria: Secondary | ICD-10-CM | POA: Diagnosis not present

## 2016-06-19 DIAGNOSIS — M1611 Unilateral primary osteoarthritis, right hip: Secondary | ICD-10-CM | POA: Diagnosis not present

## 2016-07-02 DIAGNOSIS — E785 Hyperlipidemia, unspecified: Secondary | ICD-10-CM | POA: Diagnosis not present

## 2016-07-02 DIAGNOSIS — Z6836 Body mass index (BMI) 36.0-36.9, adult: Secondary | ICD-10-CM | POA: Diagnosis not present

## 2016-07-02 DIAGNOSIS — I10 Essential (primary) hypertension: Secondary | ICD-10-CM | POA: Diagnosis not present

## 2017-05-07 DIAGNOSIS — I10 Essential (primary) hypertension: Secondary | ICD-10-CM | POA: Diagnosis not present

## 2017-05-07 DIAGNOSIS — E782 Mixed hyperlipidemia: Secondary | ICD-10-CM | POA: Diagnosis not present

## 2017-05-07 DIAGNOSIS — M160 Bilateral primary osteoarthritis of hip: Secondary | ICD-10-CM | POA: Diagnosis not present

## 2017-05-21 DIAGNOSIS — D485 Neoplasm of uncertain behavior of skin: Secondary | ICD-10-CM | POA: Diagnosis not present

## 2017-05-21 DIAGNOSIS — D225 Melanocytic nevi of trunk: Secondary | ICD-10-CM | POA: Diagnosis not present

## 2017-05-28 DIAGNOSIS — C4372 Malignant melanoma of left lower limb, including hip: Secondary | ICD-10-CM | POA: Diagnosis not present

## 2017-06-18 DIAGNOSIS — M1611 Unilateral primary osteoarthritis, right hip: Secondary | ICD-10-CM | POA: Diagnosis not present

## 2017-06-25 DIAGNOSIS — C4372 Malignant melanoma of left lower limb, including hip: Secondary | ICD-10-CM | POA: Diagnosis not present

## 2017-07-15 DIAGNOSIS — D2239 Melanocytic nevi of other parts of face: Secondary | ICD-10-CM | POA: Diagnosis not present

## 2017-07-15 DIAGNOSIS — D225 Melanocytic nevi of trunk: Secondary | ICD-10-CM | POA: Diagnosis not present

## 2017-07-15 DIAGNOSIS — L814 Other melanin hyperpigmentation: Secondary | ICD-10-CM | POA: Diagnosis not present

## 2017-07-15 DIAGNOSIS — D485 Neoplasm of uncertain behavior of skin: Secondary | ICD-10-CM | POA: Diagnosis not present

## 2017-07-15 DIAGNOSIS — L821 Other seborrheic keratosis: Secondary | ICD-10-CM | POA: Diagnosis not present

## 2017-07-23 DIAGNOSIS — D485 Neoplasm of uncertain behavior of skin: Secondary | ICD-10-CM | POA: Diagnosis not present

## 2017-10-15 DIAGNOSIS — D225 Melanocytic nevi of trunk: Secondary | ICD-10-CM | POA: Diagnosis not present

## 2017-10-15 DIAGNOSIS — D485 Neoplasm of uncertain behavior of skin: Secondary | ICD-10-CM | POA: Diagnosis not present

## 2018-04-29 DIAGNOSIS — L814 Other melanin hyperpigmentation: Secondary | ICD-10-CM | POA: Diagnosis not present

## 2018-04-29 DIAGNOSIS — D2239 Melanocytic nevi of other parts of face: Secondary | ICD-10-CM | POA: Diagnosis not present

## 2018-04-29 DIAGNOSIS — D225 Melanocytic nevi of trunk: Secondary | ICD-10-CM | POA: Diagnosis not present

## 2018-04-29 DIAGNOSIS — L821 Other seborrheic keratosis: Secondary | ICD-10-CM | POA: Diagnosis not present

## 2018-06-16 DIAGNOSIS — M1611 Unilateral primary osteoarthritis, right hip: Secondary | ICD-10-CM | POA: Diagnosis not present

## 2018-09-05 DIAGNOSIS — Z23 Encounter for immunization: Secondary | ICD-10-CM | POA: Diagnosis not present

## 2018-09-05 DIAGNOSIS — Z6839 Body mass index (BMI) 39.0-39.9, adult: Secondary | ICD-10-CM | POA: Diagnosis not present

## 2018-09-05 DIAGNOSIS — E782 Mixed hyperlipidemia: Secondary | ICD-10-CM | POA: Diagnosis not present

## 2018-09-05 DIAGNOSIS — I10 Essential (primary) hypertension: Secondary | ICD-10-CM | POA: Diagnosis not present

## 2018-11-11 DIAGNOSIS — Z85820 Personal history of malignant melanoma of skin: Secondary | ICD-10-CM | POA: Diagnosis not present

## 2018-11-11 DIAGNOSIS — L570 Actinic keratosis: Secondary | ICD-10-CM | POA: Diagnosis not present

## 2018-11-11 DIAGNOSIS — D2239 Melanocytic nevi of other parts of face: Secondary | ICD-10-CM | POA: Diagnosis not present

## 2018-11-11 DIAGNOSIS — D1801 Hemangioma of skin and subcutaneous tissue: Secondary | ICD-10-CM | POA: Diagnosis not present

## 2018-11-11 DIAGNOSIS — D225 Melanocytic nevi of trunk: Secondary | ICD-10-CM | POA: Diagnosis not present

## 2018-11-11 DIAGNOSIS — D485 Neoplasm of uncertain behavior of skin: Secondary | ICD-10-CM | POA: Diagnosis not present

## 2019-02-17 DIAGNOSIS — E782 Mixed hyperlipidemia: Secondary | ICD-10-CM | POA: Diagnosis not present

## 2019-02-17 DIAGNOSIS — M160 Bilateral primary osteoarthritis of hip: Secondary | ICD-10-CM | POA: Diagnosis not present

## 2019-02-17 DIAGNOSIS — I10 Essential (primary) hypertension: Secondary | ICD-10-CM | POA: Diagnosis not present

## 2019-02-17 DIAGNOSIS — Z6841 Body Mass Index (BMI) 40.0 and over, adult: Secondary | ICD-10-CM | POA: Diagnosis not present

## 2019-02-17 DIAGNOSIS — C4372 Malignant melanoma of left lower limb, including hip: Secondary | ICD-10-CM | POA: Diagnosis not present

## 2019-08-18 DIAGNOSIS — L821 Other seborrheic keratosis: Secondary | ICD-10-CM | POA: Diagnosis not present

## 2019-08-18 DIAGNOSIS — C44712 Basal cell carcinoma of skin of right lower limb, including hip: Secondary | ICD-10-CM | POA: Diagnosis not present

## 2019-08-18 DIAGNOSIS — D485 Neoplasm of uncertain behavior of skin: Secondary | ICD-10-CM | POA: Diagnosis not present

## 2019-08-18 DIAGNOSIS — L578 Other skin changes due to chronic exposure to nonionizing radiation: Secondary | ICD-10-CM | POA: Diagnosis not present

## 2019-08-18 DIAGNOSIS — Z85820 Personal history of malignant melanoma of skin: Secondary | ICD-10-CM | POA: Diagnosis not present

## 2019-08-18 DIAGNOSIS — L570 Actinic keratosis: Secondary | ICD-10-CM | POA: Diagnosis not present

## 2019-09-29 DIAGNOSIS — L821 Other seborrheic keratosis: Secondary | ICD-10-CM | POA: Diagnosis not present

## 2019-10-13 DIAGNOSIS — L821 Other seborrheic keratosis: Secondary | ICD-10-CM | POA: Diagnosis not present

## 2019-10-28 ENCOUNTER — Other Ambulatory Visit: Payer: Self-pay | Admitting: Family Medicine

## 2019-10-28 DIAGNOSIS — Z1231 Encounter for screening mammogram for malignant neoplasm of breast: Secondary | ICD-10-CM

## 2020-01-25 DIAGNOSIS — Z20828 Contact with and (suspected) exposure to other viral communicable diseases: Secondary | ICD-10-CM | POA: Diagnosis not present

## 2020-01-25 DIAGNOSIS — U071 COVID-19: Secondary | ICD-10-CM | POA: Diagnosis not present

## 2020-07-13 DIAGNOSIS — I639 Cerebral infarction, unspecified: Secondary | ICD-10-CM

## 2020-07-13 HISTORY — DX: Cerebral infarction, unspecified: I63.9

## 2020-09-12 DIAGNOSIS — G4489 Other headache syndrome: Secondary | ICD-10-CM | POA: Diagnosis not present

## 2020-09-12 DIAGNOSIS — Z23 Encounter for immunization: Secondary | ICD-10-CM | POA: Diagnosis not present

## 2020-09-12 DIAGNOSIS — G459 Transient cerebral ischemic attack, unspecified: Secondary | ICD-10-CM | POA: Diagnosis not present

## 2020-09-12 DIAGNOSIS — Z882 Allergy status to sulfonamides status: Secondary | ICD-10-CM | POA: Diagnosis not present

## 2020-09-12 DIAGNOSIS — I639 Cerebral infarction, unspecified: Secondary | ICD-10-CM | POA: Insufficient documentation

## 2020-09-12 DIAGNOSIS — I6529 Occlusion and stenosis of unspecified carotid artery: Secondary | ICD-10-CM | POA: Diagnosis not present

## 2020-09-12 DIAGNOSIS — R000 Tachycardia, unspecified: Secondary | ICD-10-CM | POA: Diagnosis not present

## 2020-09-12 DIAGNOSIS — R29700 NIHSS score 0: Secondary | ICD-10-CM | POA: Diagnosis not present

## 2020-09-12 DIAGNOSIS — R0989 Other specified symptoms and signs involving the circulatory and respiratory systems: Secondary | ICD-10-CM | POA: Diagnosis not present

## 2020-09-12 DIAGNOSIS — I10 Essential (primary) hypertension: Secondary | ICD-10-CM | POA: Diagnosis not present

## 2020-09-12 DIAGNOSIS — R1310 Dysphagia, unspecified: Secondary | ICD-10-CM | POA: Diagnosis not present

## 2020-09-12 DIAGNOSIS — I482 Chronic atrial fibrillation, unspecified: Secondary | ICD-10-CM | POA: Diagnosis not present

## 2020-09-12 DIAGNOSIS — R4702 Dysphasia: Secondary | ICD-10-CM | POA: Diagnosis not present

## 2020-09-12 DIAGNOSIS — Z85820 Personal history of malignant melanoma of skin: Secondary | ICD-10-CM | POA: Diagnosis not present

## 2020-09-12 DIAGNOSIS — R202 Paresthesia of skin: Secondary | ICD-10-CM | POA: Diagnosis not present

## 2020-09-12 DIAGNOSIS — I4891 Unspecified atrial fibrillation: Secondary | ICD-10-CM | POA: Diagnosis not present

## 2020-09-12 DIAGNOSIS — I6389 Other cerebral infarction: Secondary | ICD-10-CM | POA: Diagnosis not present

## 2020-09-12 DIAGNOSIS — J811 Chronic pulmonary edema: Secondary | ICD-10-CM | POA: Diagnosis not present

## 2020-09-12 DIAGNOSIS — R29818 Other symptoms and signs involving the nervous system: Secondary | ICD-10-CM | POA: Diagnosis not present

## 2020-09-12 DIAGNOSIS — R471 Dysarthria and anarthria: Secondary | ICD-10-CM | POA: Diagnosis not present

## 2020-09-13 DIAGNOSIS — I639 Cerebral infarction, unspecified: Secondary | ICD-10-CM | POA: Diagnosis not present

## 2020-09-13 DIAGNOSIS — I482 Chronic atrial fibrillation, unspecified: Secondary | ICD-10-CM | POA: Diagnosis not present

## 2020-09-13 DIAGNOSIS — I10 Essential (primary) hypertension: Secondary | ICD-10-CM | POA: Diagnosis not present

## 2020-09-13 DIAGNOSIS — I6389 Other cerebral infarction: Secondary | ICD-10-CM | POA: Diagnosis not present

## 2020-09-14 ENCOUNTER — Telehealth: Payer: PPO

## 2020-09-14 NOTE — Telephone Encounter (Signed)
Patient's daughter calling to schedule appointment from recent ED visit. [facility] sent a referral to cardiology. Was in chart looking for referral/notes.

## 2020-09-15 ENCOUNTER — Other Ambulatory Visit: Payer: Self-pay

## 2020-09-15 DIAGNOSIS — R4781 Slurred speech: Secondary | ICD-10-CM | POA: Diagnosis not present

## 2020-09-15 DIAGNOSIS — R079 Chest pain, unspecified: Secondary | ICD-10-CM | POA: Diagnosis not present

## 2020-09-15 DIAGNOSIS — G9389 Other specified disorders of brain: Secondary | ICD-10-CM | POA: Diagnosis not present

## 2020-09-15 DIAGNOSIS — R531 Weakness: Secondary | ICD-10-CM | POA: Diagnosis not present

## 2020-09-15 DIAGNOSIS — R0902 Hypoxemia: Secondary | ICD-10-CM | POA: Diagnosis not present

## 2020-09-15 DIAGNOSIS — Z7901 Long term (current) use of anticoagulants: Secondary | ICD-10-CM | POA: Diagnosis not present

## 2020-09-15 DIAGNOSIS — R0789 Other chest pain: Secondary | ICD-10-CM | POA: Diagnosis not present

## 2020-09-15 DIAGNOSIS — Z8673 Personal history of transient ischemic attack (TIA), and cerebral infarction without residual deficits: Secondary | ICD-10-CM | POA: Diagnosis not present

## 2020-09-15 DIAGNOSIS — I6389 Other cerebral infarction: Secondary | ICD-10-CM | POA: Diagnosis not present

## 2020-09-15 DIAGNOSIS — I499 Cardiac arrhythmia, unspecified: Secondary | ICD-10-CM | POA: Diagnosis not present

## 2020-09-15 DIAGNOSIS — I6340 Cerebral infarction due to embolism of unspecified cerebral artery: Secondary | ICD-10-CM | POA: Diagnosis not present

## 2020-09-15 DIAGNOSIS — I4891 Unspecified atrial fibrillation: Secondary | ICD-10-CM | POA: Diagnosis not present

## 2020-09-15 DIAGNOSIS — J90 Pleural effusion, not elsewhere classified: Secondary | ICD-10-CM | POA: Diagnosis not present

## 2020-09-15 DIAGNOSIS — I639 Cerebral infarction, unspecified: Secondary | ICD-10-CM | POA: Diagnosis not present

## 2020-09-16 ENCOUNTER — Ambulatory Visit: Payer: PPO | Admitting: Cardiology

## 2020-09-16 ENCOUNTER — Other Ambulatory Visit: Payer: Self-pay

## 2020-09-16 ENCOUNTER — Encounter: Payer: Self-pay | Admitting: Cardiology

## 2020-09-16 ENCOUNTER — Ambulatory Visit (INDEPENDENT_AMBULATORY_CARE_PROVIDER_SITE_OTHER): Payer: PPO

## 2020-09-16 VITALS — BP 130/88 | HR 80 | Ht 64.0 in | Wt 250.8 lb

## 2020-09-16 DIAGNOSIS — I4891 Unspecified atrial fibrillation: Secondary | ICD-10-CM | POA: Insufficient documentation

## 2020-09-16 DIAGNOSIS — I4819 Other persistent atrial fibrillation: Secondary | ICD-10-CM | POA: Diagnosis not present

## 2020-09-16 DIAGNOSIS — Z8673 Personal history of transient ischemic attack (TIA), and cerebral infarction without residual deficits: Secondary | ICD-10-CM

## 2020-09-16 DIAGNOSIS — I482 Chronic atrial fibrillation, unspecified: Secondary | ICD-10-CM | POA: Diagnosis not present

## 2020-09-16 DIAGNOSIS — E782 Mixed hyperlipidemia: Secondary | ICD-10-CM

## 2020-09-16 DIAGNOSIS — I10 Essential (primary) hypertension: Secondary | ICD-10-CM

## 2020-09-16 HISTORY — DX: Mixed hyperlipidemia: E78.2

## 2020-09-16 HISTORY — DX: Personal history of transient ischemic attack (TIA), and cerebral infarction without residual deficits: Z86.73

## 2020-09-16 HISTORY — DX: Essential (primary) hypertension: I10

## 2020-09-16 HISTORY — DX: Unspecified atrial fibrillation: I48.91

## 2020-09-16 HISTORY — DX: Morbid (severe) obesity due to excess calories: E66.01

## 2020-09-16 NOTE — Progress Notes (Signed)
Cardiology Office Note:    Date:  09/16/2020   ID:  Teresa, Blair October 14, 1951, MRN [id number]  PCP:  Lillard Anes, MD  Cardiologist:  Berniece Salines, DO  Electrophysiologist:  None   Referring MD: Lillard Anes,*   I had a recent stroke.  History of Present Illness:    Teresa Blair is a 69 y.o. female with a hx of recent recently diagnosed atrial fibrillation, recent CVA, hypertension, the patient was here in the hospital and was admitted on September 12, 2020 discharge on September 13, 2020 at which time she was taking care of 4 acute CVA as well as new onset atrial fibrillation.  She is here today to establish cardiac care.  The patient tells me that yesterday she ended up going to [facility] given the fact that she had some significant palpitation and she fell not right.  At [facility] she was started on metoprolol succinate 12.5 mg twice a day.  She states that last night was the first time she was able to sleep well.  The medication had helped her.  She denies any chest pain or any shortness of breath.  Past Medical History:  Diagnosis Date  . Asthma    as a child  . Gestational diabetes   . Headache   . Hypercholesterolemia   . Hypertension   . Kidney stones   . Obesity   . Osteoarthritis   . Scleroderma, localized   . Wears glasses     Past Surgical History:  Procedure Laterality Date  . 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 06/08/2015   Procedure: CONVERSION TO TOTAL HIP, ANTERIOR TOTAL HIP;  Surgeon: Kathryne Hitch, MD;  Location: [place];  Service: Orthopedics;  Laterality: Right;  . FRACTURE SURGERY     right hip  . TOTAL HIP ARTHROPLASTY Right 06/08/2015  . TUBAL LIGATION    . WISDOM TOOTH EXTRACTION      Current Medications: Current Meds  Medication Sig  . atorvastatin (LIPITOR) 40 MG tablet Take 40 mg by mouth daily.  Marland Kitchen ELIQUIS 5 MG TABS tablet Take 5 mg by mouth 2 (two) times daily.  . metoprolol succinate (TOPROL-XL) 25 MG 24  hr tablet Take 12.5 mg by mouth in the morning and at bedtime.     Allergies:   Other, Sulfa antibiotics, and Sulfamethoxazole   Social History   Socioeconomic History  . Marital status: Single    Spouse name: Not on file  . Number of children: Not on file  . Years of education: Not on file  . Highest education level: Not on file  Occupational History  . Not on file  Tobacco Use  . Smoking status: Never Smoker  . Smokeless tobacco: Never Used  Substance and Sexual Activity  . Alcohol use: No  . Drug use: No  . Sexual activity: Not on file  Other Topics Concern  . Not on file  Social History Narrative  . Not on file   Social Determinants of Health   Financial Resource Strain:   . Difficulty of Paying Living Expenses: Not on file  Food Insecurity:   . Worried About Charity fundraiser in the Last Year: Not on file  . Ran Out of Food in the Last Year: Not on file  Transportation Needs:   . Lack of Transportation (Medical): Not on file  . Lack of Transportation (Non-Medical): Not on file  Physical Activity:   . Days of Exercise per Week: Not on  file  . Minutes of Exercise per Session: Not on file  Stress:   . Feeling of Stress : Not on file  Social Connections:   . Frequency of Communication with Friends and Family: Not on file  . Frequency of Social Gatherings with Friends and Family: Not on file  . Attends Religious Services: Not on file  . Active Member of Clubs or Organizations: Not on file  . Attends Archivist Meetings: Not on file  . Marital Status: Not on file     Family History: The patient's family history includes Cancer in her father and mother; Hypertension in an other family member.  ROS:   Review of Systems  Constitution: Negative for decreased appetite, fever and weight gain.  HENT: Negative for congestion, ear discharge, hoarse voice and sore throat.   Eyes: Negative for discharge, redness, vision loss in right eye and visual halos.    Cardiovascular: Negative for chest pain, dyspnea on exertion, leg swelling, orthopnea and palpitations.  Respiratory: Negative for cough, hemoptysis, shortness of breath and snoring.   Endocrine: Negative for heat intolerance and polyphagia.  Hematologic/Lymphatic: Negative for bleeding problem. Does not bruise/bleed easily.  Skin: Negative for flushing, nail changes, rash and suspicious lesions.  Musculoskeletal: Negative for arthritis, joint pain, muscle cramps, myalgias, neck pain and stiffness.  Gastrointestinal: Negative for abdominal pain, bowel incontinence, diarrhea and excessive appetite.  Genitourinary: Negative for decreased libido, genital sores and incomplete emptying.  Neurological: Negative for brief paralysis, focal weakness, headaches and loss of balance.  Psychiatric/Behavioral: Negative for altered mental status, depression and suicidal ideas.  Allergic/Immunologic: Negative for HIV exposure and persistent infections.    EKGs/Labs/Other Studies Reviewed:    The following studies were reviewed today:   EKG:  The ekg ordered today demonstrates   Trace transthoracic echocardiogram done in a [facility] shows EF 55 to 60%.  Unable to determine diastolic function due to atrial fibrillation.  Left atrial was mildly dilated.  Right atrium was mildly enlarged.  Trace mitral regurgitation was present trace tricuspid regurgitation was present.  MRI shows a 2.7 x 1.4 cm acute cortical subcortical infarct within the posterior lateral right frontal lobe.  Mild cerebral white chronic small vessel ischemic disease.  Recent Labs: No results found for requested labs within last 8760 hours.  Recent Lipid Panel No results found for: CHOL, TRIG, HDL, CHOLHDL, VLDL, LDLCALC, LDLDIRECT  Physical Exam:    VS:  BP 130/88   Pulse 80   Ht 5\' 4"  (1.626 m)   Wt 250 lb 12.8 oz (113.8 kg)   SpO2 95%   BMI 43.05 kg/m     Wt Readings from Last 3 Encounters:  09/16/20 250 lb 12.8 oz  (113.8 kg)  06/08/15 250 lb (113.4 kg)  05/26/15 250 lb 8 oz (113.6 kg)     GEN: Well nourished, well developed in no acute distress HEENT: Normal NECK: No JVD; No carotid bruits LYMPHATICS: No lymphadenopathy CARDIAC: S1S2 noted,RRR, no murmurs, rubs, gallops RESPIRATORY:  Clear to auscultation without rales, wheezing or rhonchi  ABDOMEN: Soft, non-tender, non-distended, +bowel sounds, no guarding. EXTREMITIES: No edema, No cyanosis, no clubbing MUSCULOSKELETAL:  No deformity  SKIN: Warm and dry NEUROLOGIC:  Alert and oriented x 3, non-focal PSYCHIATRIC:  Normal affect, good insight  ASSESSMENT:    1. Persistent atrial fibrillation ([place])   2. History of CVA (cerebrovascular accident)   3. Mixed hyperlipidemia   4. Morbid obesity ([place])   5. Primary hypertension    PLAN:  She is on Eliquis and now metoprolol succinate for her new onset atrial fibrillation.  Unclear if the patient is chronically in A. fib but I like to do giving her symptoms as place a monitor on her chest to see her heart rate excursion and also understand more for atrial fibrillation if is persistent or paroxysmal.  Once we can get a good understanding we will discuss more treatment strategy which will include rate versus rhythm.  She has been anticoagulated with Eliquis.  I did discuss with the patient about this new medication that she is on.  I have educated patient on the side effects of this medication. I also urge her to abstain from any taking behaviors.  She understands that she is now at a high risk of bleeding due to being on anticoagulation.  She was also advised that if she ever falls and especially hit her head to be seen in the emergency department.  I educated the patient on her new diagnosis as well and all of her questions have been answered.  She snores and she does have intermittent fatigue I think with her new diagnosis of atrial fibrillation a sleep study also will be beneficial for this  patient.  Her blood pressure is acceptable in the office however the patient and her daughter tells me that her blood pressure usually at home is running in the 1 6170s.  I have asked him to take her blood pressure readings and be send this information to me via MyChart.  And we can make arrangements in the next week if we need to adjust or add any medications.  I want to avoid hypotension given her blood pressure is normal today.  Continue her atorvastatin as well as her Eliquis.  She needs to see neurology she has not been set up with neurology so we are going to send a referral, this will help her in treatment plans given her recent CVA.  The patient understands the need to lose weight with diet and exercise. We have discussed specific strategies for this.  The patient is in agreement with the above plan. The patient left the office in stable condition.  The patient will follow up in   Medication Adjustments/Labs and Tests Ordered: Current medicines are reviewed at length with the patient today.  Concerns regarding medicines are outlined above.  Orders Placed This Encounter  Procedures  . Ambulatory referral to Neurology  . LONG TERM MONITOR (3-14 DAYS)   No orders of the defined types were placed in this encounter.   Patient Instructions  Medication Instructions:  *If you need a refill on your cardiac medications before your next appointment, please call your pharmacy*  Testing/Procedures: Your physician has recommended that you wear a 7 day ZIO monitor. ZIO monitors are medical devices that record the heart's electrical activity. Doctors most often use these monitors to diagnose arrhythmias. Arrhythmias are problems with the speed or rhythm of the heartbeat. The monitor is a small, portable device. You can wear one while you do your normal daily activities. This is usually used to diagnose what is causing palpitations/syncope (passing out).  Follow-Up: At [facility], you and  your health needs are our priority.  As part of our continuing mission to provide you with exceptional heart care, we have created designated Provider Care Teams.  These Care Teams include your primary Cardiologist (physician) and Advanced Practice Providers (APPs -  Physician Assistants and Nurse Practitioners) who all work together to provide you with the  care you need, when you need it.  We recommend signing up for the patient portal called "MyChart".  Sign up information is provided on this After Visit Summary.  MyChart is used to connect with patients for Virtual Visits (Telemedicine).  Patients are able to view lab/test results, encounter notes, upcoming appointments, etc.  Non-urgent messages can be sent to your provider as well.   To learn more about what you can do with MyChart, go to [email or web address].    Your next appointment:   Your physician recommends that you schedule a follow-up appointment in: 1 MONTH with Dr. Harriet Masson  The format for your next appointment:   In Person with Berniece Salines, DO        Adopting a Healthy Lifestyle.  Know what a healthy weight is for you (roughly BMI <25) and aim to maintain this   Aim for 7+ servings of fruits and vegetables daily   65-80+ fluid ounces of water or unsweet tea for healthy kidneys   Limit to max 1 drink of alcohol per day; avoid smoking/tobacco   Limit animal fats in diet for cholesterol and heart health - choose grass fed whenever available   Avoid highly processed foods, and foods high in saturated/trans fats   Aim for low stress - take time to unwind and care for your mental health   Aim for 150 min of moderate intensity exercise weekly for heart health, and weights twice weekly for bone health   Aim for 7-9 hours of sleep daily   When it comes to diets, agreement about the perfect plan isnt easy to find, even among the experts. Experts at the [place] developed an idea known as the Healthy  Eating Plate. Just imagine a plate divided into logical, healthy portions.   The emphasis is on diet quality:   Load up on vegetables and fruits - one-half of your plate: Aim for color and variety, and remember that potatoes dont count.   Go for whole grains - one-quarter of your plate: Whole wheat, barley, wheat berries, quinoa, oats, brown rice, and foods made with them. If you want pasta, go with whole wheat pasta.   Protein power - one-quarter of your plate: Fish, chicken, beans, and nuts are all healthy, versatile protein sources. Limit red meat.   The diet, however, does go beyond the plate, offering a few other suggestions.   Use healthy plant oils, such as olive, canola, soy, corn, sunflower and peanut. Check the labels, and avoid partially hydrogenated oil, which have unhealthy trans fats.   If youre thirsty, drink water. Coffee and tea are good in moderation, but skip sugary drinks and limit milk and dairy products to one or two daily servings.   The type of carbohydrate in the diet is more important than the amount. Some sources of carbohydrates, such as vegetables, fruits, whole grains, and beans-are healthier than others.   Finally, stay active  Signed, Berniece Salines, DO  09/16/2020 4:53 PM    [facility]

## 2020-09-16 NOTE — Patient Instructions (Signed)
Medication Instructions:  *If you need a refill on your cardiac medications before your next appointment, please call your pharmacy*  Testing/Procedures: Your physician has recommended that you wear a 7 day ZIO monitor. ZIO monitors are medical devices that record the heart's electrical activity. Doctors most often use these monitors to diagnose arrhythmias. Arrhythmias are problems with the speed or rhythm of the heartbeat. The monitor is a small, portable device. You can wear one while you do your normal daily activities. This is usually used to diagnose what is causing palpitations/syncope (passing out).  Follow-Up: At [facility], you and your health needs are our priority.  As part of our continuing mission to provide you with exceptional heart care, we have created designated Provider Care Teams.  These Care Teams include your primary Cardiologist (physician) and Advanced Practice Providers (APPs -  Physician Assistants and Nurse Practitioners) who all work together to provide you with the care you need, when you need it.  We recommend signing up for the patient portal called "MyChart".  Sign up information is provided on this After Visit Summary.  MyChart is used to connect with patients for Virtual Visits (Telemedicine).  Patients are able to view lab/test results, encounter notes, upcoming appointments, etc.  Non-urgent messages can be sent to your provider as well.   To learn more about what you can do with MyChart, go to [email or web address].    Your next appointment:   Your physician recommends that you schedule a follow-up appointment in: 1 MONTH with Dr. Harriet Masson  The format for your next appointment:   In Person with Berniece Salines, DO

## 2020-09-20 ENCOUNTER — Ambulatory Visit: Payer: PPO | Admitting: Neurology

## 2020-09-20 ENCOUNTER — Other Ambulatory Visit: Payer: Self-pay

## 2020-09-20 ENCOUNTER — Encounter: Payer: Self-pay | Admitting: Neurology

## 2020-09-20 VITALS — BP 135/79 | HR 83 | Ht 64.0 in | Wt 251.5 lb

## 2020-09-20 DIAGNOSIS — Z8673 Personal history of transient ischemic attack (TIA), and cerebral infarction without residual deficits: Secondary | ICD-10-CM

## 2020-09-20 NOTE — Progress Notes (Signed)
Reason for visit: Stroke event  Referring physician: [facility]  Teresa Blair is a 69 y.o. female  History of present illness:  Teresa Blair is a 69 year old right-handed white female with history of a recent stroke event.  The patient initially was seen at [facility] on 12 September 2020.  The patient had felt some problems with fatigue for about a week prior to that hospitalization, she had a blackout while at work.  The patient had difficulty with speech following the blackout, she was taken to the emergency room and was noted to have a right frontal embolic stroke.  She was noted to be in atrial fibrillation which was a new diagnosis for her.  She has since been seen through cardiology.  She has been placed on metoprolol.  She had a 2D echocardiogram in the hospital, the patient and her daughter does not believe that she had a carotid Doppler study or any other evaluation of cerebrovascular circulation.  She was placed on anticoagulant therapy and was discharged home.  The patient has had some cognitive changes following the stroke, she usually works as a Pharmacist, hospital.  She has not been back to work.  She has noted that her speech is more slow and lower in amplitude.  She has had some problems with stumbling due to problems with the left leg.  She has a history of a right total hip replacement has had difficulty with mobility of the right hip for a number of years.  She has started using a cane for ambulation.  She has not had any falls at home.  She reports no numbness that is new of the arms or legs, she has chronic numbness below the knee of the right leg.  She denies issues controlling the bowels or the bladder.  She has not had difficulty with swallowing.  She presented to [facility] emergency room on 15 September 2020 with facial tingling and difficulty swallowing, a CT scan of the brain was done and showed no acute changes, a subacute right frontal stroke was identified.  The patient is sent  to this office for further evaluation.  The records from the hospital admission are not available to me.  Past Medical History:  Diagnosis Date  . Asthma    as a child  . Gestational diabetes   . Headache   . Hypercholesterolemia   . Hypertension   . Kidney stones   . Obesity   . Osteoarthritis   . Scleroderma, localized   . Stroke ([place]) 07/13/2020   left side weakness/speech affected  . Wears glasses     Past Surgical History:  Procedure Laterality Date  . 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 06/08/2015   Procedure: CONVERSION TO TOTAL HIP, ANTERIOR TOTAL HIP;  Surgeon: Kathryne Hitch, MD;  Location: [place];  Service: Orthopedics;  Laterality: Right;  . FRACTURE SURGERY     right hip  . TOTAL HIP ARTHROPLASTY Right 06/08/2015  . TUBAL LIGATION    . WISDOM TOOTH EXTRACTION      Family History  Problem Relation Age of Onset  . Cancer Mother   . Cancer Father   . Hypertension Other     Social history:  reports that she has never smoked. She has never used smokeless tobacco. She reports that she does not drink alcohol and does not use drugs.  Medications:  Prior to Admission medications   Medication Sig Start Date End Date Taking? Authorizing Provider  atorvastatin (LIPITOR) 40 MG tablet  Take 40 mg by mouth daily. 09/13/20  Yes [provider]  ELIQUIS 5 MG TABS tablet Take 5 mg by mouth 2 (two) times daily. 09/13/20  Yes [provider]  metoprolol succinate (TOPROL-XL) 25 MG 24 hr tablet Take 12.5 mg by mouth in the morning and at bedtime.   Yes [provider]      Allergies  Allergen Reactions  . Other Hives and Swelling    Poppy seeds  . Sulfa Antibiotics Hives  . Sulfamethoxazole Rash    ROS:  Out of a complete 14 system review of symptoms, the patient complains only of the following symptoms, and all other reviewed systems are negative.  Difficulty with speech, difficulty walking Mild cognitive changes Decreased right hip  mobility  Blood pressure 135/79, pulse 83, height 5\' 4"  (1.626 m), weight 251 lb 8 oz (114.1 kg).  Physical Exam  General: The patient is alert and cooperative at the time of the examination.  The patient is markedly obese.  Eyes: Pupils are equal, round, and reactive to light. Discs are flat bilaterally. Good veinous pulsations were seen.  Neck: The neck is supple, no carotid bruits are noted.  Respiratory: The respiratory examination is clear.  Cardiovascular: The cardiovascular examination reveals an irregular rate and rhythm, no obvious murmurs or rubs are noted.  Skin: Extremities are without significant edema.  Neurologic Exam  Mental status: The patient is alert and oriented x 3 at the time of the examination. The patient has apparent normal recent and remote memory, with an apparently normal attention span and concentration ability.  Cranial nerves: Facial symmetry is present. There is good sensation of the face to pinprick and soft touch bilaterally. The strength of the facial muscles and the muscles to head turning and shoulder shrug are normal bilaterally. Speech is well enunciated, no aphasia or dysarthria is noted. Extraocular movements are full. Visual fields are full. The tongue is midline, and the patient has symmetric elevation of the soft palate. No obvious hearing deficits are noted.  Motor: The motor testing reveals 5 over 5 strength of all 4 extremities, with exception the patient has some difficulty with hip flexion on the right. Good symmetric motor tone is noted throughout.  Sensory: Sensory testing is intact to pinprick, soft touch, vibration sensation, and position sense on all 4 extremities, with exception of some decrease in pinprick sensation on the right leg below the knee and a relative decrease in vibration sensation on the right foot as compared to the left. No evidence of extinction is noted.  Coordination: Cerebellar testing reveals good  finger-nose-finger bilaterally and with heel-to-shin on the left leg, has difficulty performing with the right leg..  Gait and station: Gait is slightly wide-based, the patient usually uses a cane for ambulation but is able to walk independently.  Tandem gait was not attempted.  Romberg is negative.  Reflexes: Deep tendon reflexes are symmetric, but are somewhat depressed bilaterally. Toes are downgoing bilaterally.   CT head 09/15/20:  IMPRESSION:  Evolving recent right frontal infarction. No acute intracranial  hemorrhage or significant mass effect.     Assessment/Plan:  1.  New onset right frontal stroke  2.  New onset atrial fibrillation  The patient reports some chronic issues with mobility due to a right hip problem, but she believes that her ability to ambulate has worsened since the stroke and she has had some mild cognitive changes since the stroke.  The patient was set up for physical therapy for  gait training and she will have speech therapy for cognitive training.  She is to remain out of work for about 6 weeks following the stroke and then a try to return to work at that time.  The prognosis appears to be fairly good.  The patient will be set up for carotid Doppler study, we will try to get records from the recent admission to [place] MD 09/20/2020 9:09 AM  [facility] [address] [place], [place] [address]  Phone [phone] Fax [phone]

## 2020-09-21 ENCOUNTER — Telehealth: Payer: Self-pay | Admitting: Neurology

## 2020-09-21 ENCOUNTER — Encounter: Payer: Self-pay | Admitting: Emergency Medicine

## 2020-09-21 NOTE — Telephone Encounter (Signed)
FYI: Pt called, looking in MyChart notice date of stroke was 07/13/20 actually had stroke on 09/12/20.

## 2020-09-22 ENCOUNTER — Telehealth: Payer: Self-pay | Admitting: Neurology

## 2020-09-22 NOTE — Telephone Encounter (Signed)
I received the discharge summary from mental health.  The patient had MRI of the brain showed a 2.7 x 1.4 cm acute cortical/subcortical infarct within the posterior lateral right frontal lobe.  This infarct may involve portions of the lateral right precentral gyrus.  Mild cerebral white matter chronic small vessel ischemic disease was seen.  MRA of the head was done showing no intracranial large vessel occlusion or proximal high-grade arterial stenosis.  2D echocardiogram was done, ejection fraction was 55 to 60%.  Left atrium is mildly dilated, right atrium is mildly dilated, there is trace mitral regurgitation, trace tricuspid regurgitation.  No evaluation of the proximal carotid circulation was done.

## 2020-09-27 DIAGNOSIS — R2689 Other abnormalities of gait and mobility: Secondary | ICD-10-CM | POA: Diagnosis not present

## 2020-09-27 DIAGNOSIS — R5383 Other fatigue: Secondary | ICD-10-CM | POA: Diagnosis not present

## 2020-09-27 DIAGNOSIS — Z8673 Personal history of transient ischemic attack (TIA), and cerebral infarction without residual deficits: Secondary | ICD-10-CM | POA: Diagnosis not present

## 2020-09-27 DIAGNOSIS — R2681 Unsteadiness on feet: Secondary | ICD-10-CM | POA: Diagnosis not present

## 2020-09-27 DIAGNOSIS — M6281 Muscle weakness (generalized): Secondary | ICD-10-CM | POA: Diagnosis not present

## 2020-10-02 DIAGNOSIS — I773 Arterial fibromuscular dysplasia: Secondary | ICD-10-CM | POA: Diagnosis not present

## 2020-10-10 MED ORDER — METOPROLOL SUCCINATE ER 25 MG PO TB24: 12.5000 mg | ORAL_TABLET | Freq: Two times a day (BID) | ORAL | 3 refills | Status: DC

## 2020-10-11 ENCOUNTER — Ambulatory Visit: Payer: PPO | Admitting: Cardiology

## 2020-10-14 ENCOUNTER — Encounter: Payer: Self-pay | Admitting: Neurology

## 2020-10-14 DIAGNOSIS — E669 Obesity, unspecified: Secondary | ICD-10-CM | POA: Insufficient documentation

## 2020-10-14 DIAGNOSIS — O24419 Gestational diabetes mellitus in pregnancy, unspecified control: Secondary | ICD-10-CM | POA: Insufficient documentation

## 2020-10-14 DIAGNOSIS — Z973 Presence of spectacles and contact lenses: Secondary | ICD-10-CM | POA: Insufficient documentation

## 2020-10-14 DIAGNOSIS — R519 Headache, unspecified: Secondary | ICD-10-CM | POA: Insufficient documentation

## 2020-10-14 DIAGNOSIS — I10 Essential (primary) hypertension: Secondary | ICD-10-CM | POA: Insufficient documentation

## 2020-10-14 DIAGNOSIS — E7800 Pure hypercholesterolemia, unspecified: Secondary | ICD-10-CM | POA: Insufficient documentation

## 2020-10-14 DIAGNOSIS — J45909 Unspecified asthma, uncomplicated: Secondary | ICD-10-CM | POA: Insufficient documentation

## 2020-10-14 DIAGNOSIS — N200 Calculus of kidney: Secondary | ICD-10-CM | POA: Insufficient documentation

## 2020-10-14 DIAGNOSIS — M1990 Unspecified osteoarthritis, unspecified site: Secondary | ICD-10-CM | POA: Insufficient documentation

## 2020-10-14 DIAGNOSIS — L940 Localized scleroderma [morphea]: Secondary | ICD-10-CM | POA: Insufficient documentation

## 2020-10-14 NOTE — Telephone Encounter (Signed)
The patient needs a letter regarding return to work 6 weeks after the stroke.  The order for the carotid Doppler study was placed, she has not heard anything about scheduling, we need to look into why they have not called her yet.  I will dictate a letter for her.  The patient indicates that she wants [place] to mail the letter to her.

## 2020-10-17 NOTE — Telephone Encounter (Signed)
Will get Dr. Jannifer Franklin to sign order and send to [place].

## 2020-10-18 ENCOUNTER — Other Ambulatory Visit: Payer: Self-pay

## 2020-10-18 ENCOUNTER — Ambulatory Visit: Payer: PPO | Admitting: Cardiology

## 2020-10-18 ENCOUNTER — Encounter: Payer: Self-pay | Admitting: Cardiology

## 2020-10-18 VITALS — BP 126/88 | HR 68 | Ht 65.0 in | Wt 255.0 lb

## 2020-10-18 DIAGNOSIS — R400 Somnolence: Secondary | ICD-10-CM | POA: Diagnosis not present

## 2020-10-18 DIAGNOSIS — I639 Cerebral infarction, unspecified: Secondary | ICD-10-CM | POA: Diagnosis not present

## 2020-10-18 DIAGNOSIS — E782 Mixed hyperlipidemia: Secondary | ICD-10-CM

## 2020-10-18 DIAGNOSIS — I4819 Other persistent atrial fibrillation: Secondary | ICD-10-CM

## 2020-10-18 DIAGNOSIS — I10 Essential (primary) hypertension: Secondary | ICD-10-CM | POA: Diagnosis not present

## 2020-10-18 NOTE — Progress Notes (Signed)
Cardiology Office Note:    Date:  10/18/2020   ID:  Teresa Blair, Teresa Blair June 19, 1951, MRN [id number]  PCP:  Lillard Anes, MD  Cardiologist:  Berniece Salines, DO  Electrophysiologist:  None   Referring MD: Lillard Anes,*   Chief Complaint  Patient presents with  . Follow-up  . Chest Pain    History of Present Illness:    Teresa Blair is a 69 y.o. female with a hx of recent recently diagnosed atrial fibrillation, recent CVA, hypertension, the patient was here in the hospital and was admitted on September 12, 2020 discharge on September 13, 2020 at which time she was taking care of 4 acute CVA as well as new onset atrial fibrillation.   I have last visit I started patient on Eliquis as well as several succinate.  We discussed the use of Eliquis and his risk and benefit.  She had no questions during the time of her visit.  She is here today for follow-up visit.  The patient tells me that she has continued to experience significant fatigue since the last time she was here.  She also does have some shortness of breath.  She noticed that her daytime somnolence is also increasing.  She denies any chest pain.  She is here today with her daughter.  She continues on her Eliquis.  She did see neurology recently.    Past Medical History:  Diagnosis Date  . Asthma    as a child  . Gestational diabetes   . Headache   . Hypercholesterolemia   . Hypertension   . Kidney stones   . Obesity   . Osteoarthritis   . Scleroderma, localized   . Stroke ([place]) 09/12/2020   left side weakness/speech affected  . Wears glasses     Past Surgical History:  Procedure Laterality Date  . 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 FRACTURE SURGERY     right hip  . TOTAL HIP ARTHROPLASTY Right 06/08/2015  . TUBAL LIGATION    . WISDOM TOOTH  EXTRACTION      Current Medications: Current Meds  Medication Sig  . atorvastatin (LIPITOR) 40 MG tablet Take 40 mg by mouth daily.  Marland Kitchen ELIQUIS 5 MG TABS tablet Take 5 mg by mouth 2 (two) times daily.  . metoprolol succinate (TOPROL-XL) 25 MG 24 hr tablet Take 0.5 tablets (12.5 mg total) by mouth in the morning and at bedtime.     Allergies:   Other, Sulfa antibiotics, and Sulfamethoxazole   Social History   Socioeconomic History  . Marital status: Single    Spouse name: Not on file  . Number of children: 3  . Years of education: Not on file  . Highest education level: Not on file  Occupational History    Comment: full time  Tobacco Use  . Smoking status: Never Smoker  . Smokeless tobacco: Never Used  Substance and Sexual Activity  . Alcohol use: No  . Drug use: No  . Sexual activity: Not on file  Other Topics Concern  . Not on file  Social History Narrative   Lives alone   Right Handed   Drinks no caffeine   Social Determinants of Health   Financial Resource Strain:   . Difficulty of Paying Living Expenses: Not on file  Food Insecurity:   . Worried  About Running Out of Food in the Last Year: Not on file  . Ran Out of Food in the Last Year: Not on file  Transportation Needs:   . Lack of Transportation (Medical): Not on file  . Lack of Transportation (Non-Medical): Not on file  Physical Activity:   . Days of Exercise per Week: Not on file  . Minutes of Exercise per Session: Not on file  Stress:   . Feeling of Stress : Not on file  Social Connections:   . Frequency of Communication with Friends and Family: Not on file  . Frequency of Social Gatherings with Friends and Family: Not on file  . Attends Religious Services: Not on file  . Active Member of Clubs or Organizations: Not on file  . Attends Archivist Meetings: Not on file  . Marital Status: Not on file     Family History: The patient's family history includes Cancer in her father and mother;  Hypertension in an other family member.  ROS:   Review of Systems  Constitution: Negative for decreased appetite, fever and weight gain.  HENT: Negative for congestion, ear discharge, hoarse voice and sore throat.   Eyes: Negative for discharge, redness, vision loss in right eye and visual halos.  Cardiovascular: Negative for chest pain, dyspnea on exertion, leg swelling, orthopnea and palpitations.  Respiratory: Negative for cough, hemoptysis, shortness of breath and snoring.   Endocrine: Negative for heat intolerance and polyphagia.  Hematologic/Lymphatic: Negative for bleeding problem. Does not bruise/bleed easily.  Skin: Negative for flushing, nail changes, rash and suspicious lesions.  Musculoskeletal: Negative for arthritis, joint pain, muscle cramps, myalgias, neck pain and stiffness.  Gastrointestinal: Negative for abdominal pain, bowel incontinence, diarrhea and excessive appetite.  Genitourinary: Negative for decreased libido, genital sores and incomplete emptying.  Neurological: Negative for brief paralysis, focal weakness, headaches and loss of balance.  Psychiatric/Behavioral: Negative for altered mental status, depression and suicidal ideas.  Allergic/Immunologic: Negative for HIV exposure and persistent infections.    EKGs/Labs/Other Studies Reviewed:    The following studies were reviewed today:   EKG:  The ekg ordered today demonstrates sinus rhythm, heart rate 75 bpm.   Trace transthoracic echocardiogram done in a [facility] shows EF 55 to 60%.  Unable to determine diastolic function due to atrial fibrillation.  Left atrial was mildly dilated.  Right atrium was mildly enlarged.  Trace mitral regurgitation was present trace tricuspid regurgitation was present.  MRI shows a 2.7 x 1.4 cm acute cortical subcortical infarct within the posterior lateral right frontal lobe.  Mild cerebral white chronic small vessel ischemic disease.  Recent Labs: No results found  for requested labs within last 8760 hours.  Recent Lipid Panel No results found for: CHOL, TRIG, HDL, CHOLHDL, VLDL, LDLCALC, LDLDIRECT  Physical Exam:    VS:  BP 126/88 (BP Location: Right Arm, Patient Position: Sitting)   Pulse 68   Ht 5\' 5"  (1.651 m)   Wt 255 lb (115.7 kg)   SpO2 97%   BMI 42.43 kg/m     Wt Readings from Last 3 Encounters:  10/18/20 255 lb (115.7 kg)  09/20/20 251 lb 8 oz (114.1 kg)  09/16/20 250 lb 12.8 oz (113.8 kg)     GEN: Well nourished, well developed in no acute distress HEENT: Normal NECK: No JVD; No carotid bruits LYMPHATICS: No lymphadenopathy CARDIAC: S1S2 noted,RRR, no murmurs, rubs, gallops RESPIRATORY:  Clear to auscultation without rales, wheezing or rhonchi  ABDOMEN: Soft, non-tender, non-distended, +bowel sounds,  no guarding. EXTREMITIES: No edema, No cyanosis, no clubbing MUSCULOSKELETAL:  No deformity  SKIN: Warm and dry NEUROLOGIC:  Alert and oriented x 3, non-focal PSYCHIATRIC:  Normal affect, good insight  ASSESSMENT:    1. Persistent atrial fibrillation ([place])   2. Daytime somnolence   3. Primary hypertension   4. Cerebrovascular accident (CVA), unspecified mechanism ([employer])   5. Morbid obesity ([place])   6. Mixed hyperlipidemia    PLAN:     She still atrial fibrillation today in the office and is symptomatic.  The fatigue and shortness of breath I suspect is due to her atrial fibrillation.  I discussed with the patient and her daughter today in the office we are going to plan for TEE cardioversion.  This will be done at [facility] in the upcoming weeks.  I explained to the patient the procedure for TEE as well as cardioversion.  She understands and have no questions at this time.  I also explained to the patient that post her cardioversion she cannot stop her anticoagulation for 4 weeks no matter what.  I would like to start her antiarrhythmics once converted to sinus rhythm.  Were going to set her up as well for a sleep  study given her daytime somnolence significant fatigue and to understand if this is contributing to her atrial fibrillation.  To complete her CVA work-up [place] ordered bilateral vascular ultrasounds.  Blood work will be done for CBC and BMP as well.  In the meantime we will continue her current medication regimen.  The patient understands the need to lose weight with diet and exercise. We have discussed specific strategies for this.  The patient is in agreement with the above plan. The patient left the office in stable condition.  The patient will follow up in 3 months or sooner if needed.   Medication Adjustments/Labs and Tests Ordered: Current medicines are reviewed at length with the patient today.  Concerns regarding medicines are outlined above.  Orders Placed This Encounter  Procedures  . CBC  . Basic metabolic panel  . Ambulatory referral to Sleep Studies  . EKG 12-Lead  . Split night study  . VAS US CAROTID   No orders of the defined types were placed in this encounter.   Patient Instructions  Medication Instructions:  Your physician recommends that you continue on your current medications as directed. Please refer to the Current Medication list given to you today.  *If you need a refill on your cardiac medications before your next appointment, please call your pharmacy*   Lab Work: Your physician recommends that you return for lab work in: Within one week of your TEE/Cardioversion BMP, CBC  You will have to have a COVID swab completed within 3 days of your cardioversion. The results of this test will need to be brought in to our office so that we can fax the results over to [facility].    If you have labs (blood work) drawn today and your tests are completely normal, you will receive your results only by: Marland Kitchen MyChart Message (if you have MyChart) OR . A paper copy in the mail If you have any lab test that is abnormal or we need to change your treatment, we  will call you to review the results.   Testing/Procedures: Your physician has requested that you have a carotid duplex. This test is an ultrasound of the carotid arteries in your neck. It looks at blood flow through these arteries that supply the brain  with blood. Allow one hour for this exam. There are no restrictions or special instructions.  You are scheduled for a TEE/Cardioversion on _____ with Dr. Harriet Masson.  Please arrive at [facility] at _____ am/pm. (1 hour prior to procedure unless lab work is needed; if lab work is needed arrive 1.5 hours ahead)  DIET: Nothing to eat or drink after midnight except a sip of water with medications (see medication instructions below)  Medication Instructions:  Continue your anticoagulant: Eliquis You will need to continue your anticoagulant after your procedure until you are told by your  Provider that it is safe to stop  You must have a responsible person to drive you home and stay in the waiting area during your procedure. Failure to do so could result in cancellation.  Bring your insurance cards.  *Special Note: Every effort is made to have your procedure done on time. Occasionally there are emergencies that occur at the hospital that may cause delays. Please be patient if a delay does occur.     Follow-Up: At [facility], you and your health needs are our priority.  As part of our continuing mission to provide you with exceptional heart care, we have created designated Provider Care Teams.  These Care Teams include your primary Cardiologist (physician) and Advanced Practice Providers (APPs -  Physician Assistants and Nurse Practitioners) who all work together to provide you with the care you need, when you need it.  We recommend signing up for the patient portal called "MyChart".  Sign up information is provided on this After Visit Summary.  MyChart is used to connect with patients for Virtual Visits (Telemedicine).  Patients are able to  view lab/test results, encounter notes, upcoming appointments, etc.  Non-urgent messages can be sent to your provider as well.   To learn more about what you can do with MyChart, go to [email or web address].    Your next appointment:   3 month(s)  The format for your next appointment:   In Person  Provider:   Berniece Salines, DO   Other Instructions      Adopting a Healthy Lifestyle.  Know what a healthy weight is for you (roughly BMI <25) and aim to maintain this   Aim for 7+ servings of fruits and vegetables daily   65-80+ fluid ounces of water or unsweet tea for healthy kidneys   Limit to max 1 drink of alcohol per day; avoid smoking/tobacco   Limit animal fats in diet for cholesterol and heart health - choose grass fed whenever available   Avoid highly processed foods, and foods high in saturated/trans fats   Aim for low stress - take time to unwind and care for your mental health   Aim for 150 min of moderate intensity exercise weekly for heart health, and weights twice weekly for bone health   Aim for 7-9 hours of sleep daily   When it comes to diets, agreement about the perfect plan isnt easy to find, even among the experts. Experts at the [employer] developed an idea known as the Healthy Eating Plate. Just imagine a plate divided into logical, healthy portions.   The emphasis is on diet quality:   Load up on vegetables and fruits - one-half of your plate: Aim for color and variety, and remember that potatoes dont count.   Go for whole grains - one-quarter of your plate: Whole wheat, barley, wheat berries, quinoa, oats, brown rice, and foods made with them. If you  want pasta, go with whole wheat pasta.   Protein power - one-quarter of your plate: Fish, chicken, beans, and nuts are all healthy, versatile protein sources. Limit red meat.   The diet, however, does go beyond the plate, offering a few other suggestions.   Use healthy plant  oils, such as olive, canola, soy, corn, sunflower and peanut. Check the labels, and avoid partially hydrogenated oil, which have unhealthy trans fats.   If youre thirsty, drink water. Coffee and tea are good in moderation, but skip sugary drinks and limit milk and dairy products to one or two daily servings.   The type of carbohydrate in the diet is more important than the amount. Some sources of carbohydrates, such as vegetables, fruits, whole grains, and beans-are healthier than others.   Finally, stay active  Signed, Berniece Salines, DO  10/18/2020 11:21 AM    [place]

## 2020-10-18 NOTE — Patient Instructions (Addendum)
Medication Instructions:  Your physician recommends that you continue on your current medications as directed. Please refer to the Current Medication list given to you today.  *If you need a refill on your cardiac medications before your next appointment, please call your pharmacy*   Lab Work: Your physician recommends that you return for lab work in: Within one week of your TEE/Cardioversion BMP, CBC  You will have to have a COVID swab completed within 3 days of your cardioversion. The results of this test will need to be brought in to our office so that we can fax the results over to [facility].    If you have labs (blood work) drawn today and your tests are completely normal, you will receive your results only by: Marland Kitchen MyChart Message (if you have MyChart) OR . A paper copy in the mail If you have any lab test that is abnormal or we need to change your treatment, we will call you to review the results.   Testing/Procedures: Your physician has requested that you have a carotid duplex. This test is an ultrasound of the carotid arteries in your neck. It looks at blood flow through these arteries that supply the brain with blood. Allow one hour for this exam. There are no restrictions or special instructions.  You are scheduled for a TEE/Cardioversion on _____ with Dr. Harriet Masson.  Please arrive at [facility] at _____ am/pm. (1 hour prior to procedure unless lab work is needed; if lab work is needed arrive 1.5 hours ahead)  DIET: Nothing to eat or drink after midnight except a sip of water with medications (see medication instructions below)  Medication Instructions:  Continue your anticoagulant: Eliquis You will need to continue your anticoagulant after your procedure until you are told by your  Provider that it is safe to stop  You must have a responsible person to drive you home and stay in the waiting area during your procedure. Failure to do so could result in  cancellation.  Bring your insurance cards.  *Special Note: Every effort is made to have your procedure done on time. Occasionally there are emergencies that occur at the hospital that may cause delays. Please be patient if a delay does occur.     Follow-Up: At [facility], you and your health needs are our priority.  As part of our continuing mission to provide you with exceptional heart care, we have created designated Provider Care Teams.  These Care Teams include your primary Cardiologist (physician) and Advanced Practice Providers (APPs -  Physician Assistants and Nurse Practitioners) who all work together to provide you with the care you need, when you need it.  We recommend signing up for the patient portal called "MyChart".  Sign up information is provided on this After Visit Summary.  MyChart is used to connect with patients for Virtual Visits (Telemedicine).  Patients are able to view lab/test results, encounter notes, upcoming appointments, etc.  Non-urgent messages can be sent to your provider as well.   To learn more about what you can do with MyChart, go to [email or web address].    Your next appointment:   3 month(s)  The format for your next appointment:   In Person  Provider:   Berniece Salines, DO   Other Instructions

## 2020-10-19 ENCOUNTER — Other Ambulatory Visit: Payer: Self-pay

## 2020-10-19 DIAGNOSIS — R400 Somnolence: Secondary | ICD-10-CM

## 2020-10-24 ENCOUNTER — Telehealth: Payer: Self-pay

## 2020-10-24 ENCOUNTER — Telehealth: Payer: Self-pay | Admitting: Cardiology

## 2020-10-24 NOTE — Telephone Encounter (Signed)
Notes faxed to [employer] for PA on split night

## 2020-10-24 NOTE — Telephone Encounter (Signed)
Called patient and let her know her letter is ready for pickup

## 2020-10-27 ENCOUNTER — Telehealth: Payer: Self-pay

## 2020-10-27 DIAGNOSIS — R5383 Other fatigue: Secondary | ICD-10-CM | POA: Diagnosis not present

## 2020-10-27 DIAGNOSIS — R2689 Other abnormalities of gait and mobility: Secondary | ICD-10-CM | POA: Diagnosis not present

## 2020-10-27 DIAGNOSIS — R2681 Unsteadiness on feet: Secondary | ICD-10-CM | POA: Diagnosis not present

## 2020-10-27 DIAGNOSIS — Z8673 Personal history of transient ischemic attack (TIA), and cerebral infarction without residual deficits: Secondary | ICD-10-CM | POA: Diagnosis not present

## 2020-10-27 DIAGNOSIS — M6281 Muscle weakness (generalized): Secondary | ICD-10-CM | POA: Diagnosis not present

## 2020-10-27 NOTE — Telephone Encounter (Signed)
Spoke to the patient just now and let her know   I just received confirmation that your TEE/Cardioversion is scheduled for 11/03/2020 and you will need to arrive at 11:30 AM. The procedure is set to start at 12:30. This means you will need to get  COVID swab completed on the 6th or 7th and will need you to bring a copy of this negative result to the office.   Patient verbalizes understanding and thanks me for the call back.

## 2020-11-01 DIAGNOSIS — Z20828 Contact with and (suspected) exposure to other viral communicable diseases: Secondary | ICD-10-CM | POA: Diagnosis not present

## 2020-11-02 NOTE — Telephone Encounter (Signed)
Ref #[id number] for [employer]

## 2020-11-02 NOTE — Telephone Encounter (Signed)
PA denied for split night.  [employer] says they need a dx of some type of apnea added to get it approved.  They reviewed all the clinical notes and this is not listed.  If we can get it added by 11-03-20, it will be approved.

## 2020-11-03 ENCOUNTER — Telehealth: Payer: Self-pay | Admitting: *Deleted

## 2020-11-03 ENCOUNTER — Telehealth: Payer: Self-pay | Admitting: Cardiology

## 2020-11-03 DIAGNOSIS — Z7901 Long term (current) use of anticoagulants: Secondary | ICD-10-CM | POA: Diagnosis not present

## 2020-11-03 DIAGNOSIS — Z79899 Other long term (current) drug therapy: Secondary | ICD-10-CM | POA: Diagnosis not present

## 2020-11-03 DIAGNOSIS — I4891 Unspecified atrial fibrillation: Secondary | ICD-10-CM | POA: Diagnosis not present

## 2020-11-03 DIAGNOSIS — Z8673 Personal history of transient ischemic attack (TIA), and cerebral infarction without residual deficits: Secondary | ICD-10-CM | POA: Diagnosis not present

## 2020-11-03 DIAGNOSIS — I4819 Other persistent atrial fibrillation: Secondary | ICD-10-CM | POA: Diagnosis not present

## 2020-11-03 DIAGNOSIS — I10 Essential (primary) hypertension: Secondary | ICD-10-CM | POA: Diagnosis not present

## 2020-11-03 NOTE — Telephone Encounter (Signed)
Left message for Teresa Blair and let her know that these results were faxed yesterday but I will go ahead and fax them again. I gave her our call back number in case they need to reach out to [place] again.

## 2020-11-03 NOTE — Telephone Encounter (Signed)
-----   Message from Tye Savoy sent at 11/02/2020 10:29 AM EST ----- PA denied for split night.  [employer] says they need a dx of some type of apnea added to get it approved.  They reviewed all the clinical notes and this is not listed.  If we can get it added by 11-03-20, it will be approved.

## 2020-11-03 NOTE — Telephone Encounter (Signed)
Teresa Blair is calling stating the hospital just called her asking about her Covid screening for her test she is scheduled to come in for today, but she dropped it off at the [place] office yesterday. She is leaving for her appt soon and does not have a copy to give them. Please advise.

## 2020-11-03 NOTE — Telephone Encounter (Signed)
Message sent to Resa Miner I .RN and dr Harriet Masson to advise.

## 2020-11-08 ENCOUNTER — Encounter: Payer: Self-pay | Admitting: Neurology

## 2020-11-08 MED ORDER — ATORVASTATIN CALCIUM 40 MG PO TABS: 40.0000 mg | ORAL_TABLET | Freq: Every day | ORAL | 3 refills | Status: DC

## 2020-11-08 MED ORDER — ELIQUIS 5 MG PO TABS: 5.0000 mg | ORAL_TABLET | Freq: Two times a day (BID) | ORAL | 12 refills | Status: DC

## 2020-11-08 NOTE — Addendum Note (Signed)
Addended by: Truddie Hidden on: 11/08/2020 07:42 AM   Modules accepted: Orders

## 2020-11-14 ENCOUNTER — Telehealth: Payer: Self-pay | Admitting: *Deleted

## 2020-11-14 NOTE — Telephone Encounter (Signed)
No answer left a copy of letter @ the front desk and mailed a copy to pt.

## 2020-11-15 ENCOUNTER — Other Ambulatory Visit: Payer: Self-pay

## 2020-11-15 ENCOUNTER — Ambulatory Visit (INDEPENDENT_AMBULATORY_CARE_PROVIDER_SITE_OTHER): Payer: PPO

## 2020-11-15 DIAGNOSIS — Z8673 Personal history of transient ischemic attack (TIA), and cerebral infarction without residual deficits: Secondary | ICD-10-CM | POA: Diagnosis not present

## 2020-11-15 DIAGNOSIS — I639 Cerebral infarction, unspecified: Secondary | ICD-10-CM | POA: Diagnosis not present

## 2020-11-15 NOTE — Progress Notes (Signed)
Carotid duplex exam performed  Jimmy Alaila Pillard RDCS, RVT 

## 2020-11-16 ENCOUNTER — Telehealth: Payer: Self-pay

## 2020-11-16 NOTE — Telephone Encounter (Signed)
Patient notified of results and voiced understanding. 

## 2020-11-16 NOTE — Telephone Encounter (Signed)
-----   Message from Berniece Salines, DO sent at 11/16/2020 12:05 PM EST ----- Your vascular ultrasound shows normal right carotid artery with very minimal stenosis of the left radial artery.  Overall no further intervention needs to be done.

## 2020-12-01 ENCOUNTER — Ambulatory Visit (INDEPENDENT_AMBULATORY_CARE_PROVIDER_SITE_OTHER): Payer: PPO | Admitting: Legal Medicine

## 2020-12-01 ENCOUNTER — Other Ambulatory Visit: Payer: Self-pay

## 2020-12-01 ENCOUNTER — Encounter: Payer: Self-pay | Admitting: Legal Medicine

## 2020-12-01 DIAGNOSIS — R2681 Unsteadiness on feet: Secondary | ICD-10-CM | POA: Diagnosis not present

## 2020-12-01 DIAGNOSIS — I4819 Other persistent atrial fibrillation: Secondary | ICD-10-CM

## 2020-12-01 DIAGNOSIS — I6930 Unspecified sequelae of cerebral infarction: Secondary | ICD-10-CM | POA: Diagnosis not present

## 2020-12-01 DIAGNOSIS — M6281 Muscle weakness (generalized): Secondary | ICD-10-CM | POA: Diagnosis not present

## 2020-12-01 DIAGNOSIS — F419 Anxiety disorder, unspecified: Secondary | ICD-10-CM | POA: Insufficient documentation

## 2020-12-01 DIAGNOSIS — R5383 Other fatigue: Secondary | ICD-10-CM | POA: Diagnosis not present

## 2020-12-01 DIAGNOSIS — Z7901 Long term (current) use of anticoagulants: Secondary | ICD-10-CM | POA: Insufficient documentation

## 2020-12-01 DIAGNOSIS — Z8673 Personal history of transient ischemic attack (TIA), and cerebral infarction without residual deficits: Secondary | ICD-10-CM | POA: Diagnosis not present

## 2020-12-01 HISTORY — DX: Anxiety disorder, unspecified: F41.9

## 2020-12-01 HISTORY — DX: Long term (current) use of anticoagulants: Z79.01

## 2020-12-01 HISTORY — DX: Unspecified sequelae of cerebral infarction: I69.30

## 2020-12-01 MED ORDER — LORAZEPAM 0.5 MG PO TABS
0.5000 mg | ORAL_TABLET | Freq: Two times a day (BID) | ORAL | 1 refills | Status: DC | PRN
Start: 2020-12-01 — End: 2021-01-02

## 2020-12-01 NOTE — Progress Notes (Signed)
Subjective:  Patient ID: Teresa Blair, female    DOB: 06/21/1951  Age: 70 y.o. MRN: [id number]  Chief Complaint  Patient presents with  . Anxiety  . Atrial Fibrillation    HPI: patient ha stroke 09/12/2020 and admitted to [facility].  She was found to have atrial fibrillation and MRA showed stroke right frontal and pretemporal gyrus small.  No TPA.  Her speech came back and is now fluent.  She has clotting right atrial appendage and on anticoagulation.  Now seeing Dr. Jannifer Franklin and is in PT. Discharge 09/13/2020.  She has rtw, she has deficits in money counting.  She is improved in comprehension. She is very anxious and will need more therapy. 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  She is much improved. Current Outpatient Medications on File Prior to Visit  Medication Sig Dispense Refill  . atorvastatin (LIPITOR) 40 MG tablet Take 1 tablet (40 mg total) by mouth daily. 90 tablet 3  . ELIQUIS 5 MG TABS tablet Take 1 tablet (5 mg total) by mouth 2 (two) times daily. 60 tablet 12  . metoprolol succinate (TOPROL-XL) 25 MG 24 hr tablet Take 0.5 tablets (12.5 mg total) by mouth in the morning and at bedtime. 90 tablet 3   No current facility-administered medications on file prior to visit.   Past Medical History:  Diagnosis Date  . A-fib ([place])   . Asthma    as a child  . Gestational diabetes   . Headache   . Hypercholesterolemia   . Hypertension   . Kidney stones   . Obesity   . Osteoarthritis   . Scleroderma, localized   . Stroke ([place]) 09/12/2020   left side weakness/speech affected  . Wears glasses    Past Surgical History:  Procedure Laterality Date  . 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 FRACTURE SURGERY     right hip  . TOTAL HIP ARTHROPLASTY Right 06/08/2015  . TUBAL  LIGATION    . WISDOM TOOTH EXTRACTION      Family History  Problem Relation Age of Onset  . Cancer Mother   . Cancer Father   . Hypertension Other    Social History   Socioeconomic History  . Marital status: Single    Spouse name: Not on file  . Number of children: 3  . Years of education: Not on file  . Highest education level: Not on file  Occupational History    Comment: full time  Tobacco Use  . Smoking status: Never Smoker  . Smokeless tobacco: Never Used  Substance and Sexual Activity  . Alcohol use: No  . Drug use: No  . Sexual activity: Not Currently  Other Topics Concern  . Not on file  Social History Narrative   Lives alone   Right Handed   Drinks no caffeine   Social Determinants of Health   Financial Resource Strain: Not on file  Food Insecurity: Not on file  Transportation Needs: Not on file  Physical Activity: Not on file  Stress: Not on file  Social Connections: Not on file    Review of Systems  Constitutional: Negative for activity change, chills and fever.  HENT: Negative for congestion and sinus pain.   Eyes: Negative for visual disturbance.  Respiratory: Negative for cough, chest tightness  and stridor.   Cardiovascular: Negative for chest pain, palpitations and leg swelling.  Gastrointestinal: Negative for abdominal distention and abdominal pain.  Genitourinary: Negative for difficulty urinating and dyspareunia.  Musculoskeletal: Negative for arthralgias and back pain.  Skin: Negative.   Neurological: Negative for facial asymmetry, numbness and headaches.  Psychiatric/Behavioral: Positive for agitation.     Objective:  BP 140/80   Pulse 64   Temp (!) 96.3 F (35.7 C)   Resp 16   Ht 5\' 5"  (1.651 m)   Wt 254 lb (115.2 kg)   BMI 42.27 kg/m   BP/Weight 12/01/2020 10/18/2020 09/20/2020  Systolic BP 140 126 135  Diastolic BP 80 88 79  Wt. (Lbs) 254 255 251.5  BMI 42.27 42.43 43.17    Physical Exam Vitals reviewed.   Constitutional:      Appearance: Normal appearance.  HENT:     Head: Normocephalic and atraumatic.     Right Ear: Tympanic membrane normal.     Left Ear: Tympanic membrane normal.     Mouth/Throat:     Mouth: Mucous membranes are moist.     Pharynx: Oropharynx is clear.  Eyes:     Extraocular Movements: Extraocular movements intact.     Conjunctiva/sclera: Conjunctivae normal.     Pupils: Pupils are equal, round, and reactive to light.  Cardiovascular:     Rate and Rhythm: Normal rate. Rhythm irregular.     Pulses: Normal pulses.     Heart sounds: No murmur heard. No gallop.   Pulmonary:     Effort: Pulmonary effort is normal.     Breath sounds: Normal breath sounds. No rales.  Abdominal:     General: Abdomen is flat. Bowel sounds are normal. There is no distension.     Tenderness: There is no abdominal tenderness.  Musculoskeletal:        General: Normal range of motion.     Cervical back: Normal range of motion and neck supple.  Skin:    General: Skin is warm and dry.     Capillary Refill: Capillary refill takes less than 2 seconds.  Neurological:     General: No focal deficit present.     Mental Status: She is alert and oriented to person, place, and time.     Cranial Nerves: No cranial nerve deficit.     Sensory: No sensory deficit.     Motor: No weakness.     Coordination: Coordination normal.     Gait: Gait normal.     Deep Tendon Reflexes: Reflexes normal.  Psychiatric:        Mood and Affect: Mood normal.        Thought Content: Thought content normal.       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 Cardiology referred to me due to her anxiety that they tought needed control  The patient's status was assessed using clinical findings on exam, labs, and other diagnostic testing. Patient's success at meeting treatment goals based on disease specific evidence-bassed guidelines and found to be in good control. RECOMMENDATIONS include start lorazepam present medicines and treatment.  Patient has a diagnosis of permanent atrial fibrillation.   Patient is on eliquis and has controlled ventricular response.  Patient is CV stable/Plan for cardioversion.         I spent 30 + minutes dedicated to the care of this patient on the date of this encounter to include face-to-face time with the patient, as well as: finding and reviewing all hospital and outpatient visits for this illness and having to explain what they said that she did not understand.  Follow-up: Return in about 1 month (around 01/01/2021).  An After Visit Summary was printed and given to the patient.  Reinaldo Meeker, MD [facility] [phone]

## 2020-12-15 MED ORDER — METOPROLOL SUCCINATE ER 25 MG PO TB24: ORAL_TABLET | ORAL | 3 refills | Status: DC

## 2020-12-15 NOTE — Addendum Note (Signed)
Addended by: Resa Miner I on: 12/15/2020 07:59 AM   Modules accepted: Orders

## 2020-12-28 DIAGNOSIS — Z8673 Personal history of transient ischemic attack (TIA), and cerebral infarction without residual deficits: Secondary | ICD-10-CM | POA: Diagnosis not present

## 2021-01-02 ENCOUNTER — Encounter: Payer: Self-pay | Admitting: Legal Medicine

## 2021-01-02 ENCOUNTER — Ambulatory Visit (INDEPENDENT_AMBULATORY_CARE_PROVIDER_SITE_OTHER): Payer: PPO | Admitting: Legal Medicine

## 2021-01-02 ENCOUNTER — Other Ambulatory Visit: Payer: Self-pay

## 2021-01-02 VITALS — BP 130/90 | HR 76 | Temp 97.3°F | Resp 16 | Ht 65.0 in | Wt 257.0 lb

## 2021-01-02 DIAGNOSIS — I4819 Other persistent atrial fibrillation: Secondary | ICD-10-CM | POA: Diagnosis not present

## 2021-01-02 DIAGNOSIS — F419 Anxiety disorder, unspecified: Secondary | ICD-10-CM

## 2021-01-02 MED ORDER — LORAZEPAM 0.5 MG PO TABS: 0.5000 mg | ORAL_TABLET | Freq: Two times a day (BID) | ORAL | 3 refills | Status: DC | PRN

## 2021-01-02 NOTE — Progress Notes (Signed)
Subjective:  Patient ID: Teresa Blair, female    DOB: [birth year]  Age: 70 y.o. MRN: [id number]  Chief Complaint  Patient presents with  . Anxiety    Patient started  to take ativan for anxiety.  . Atrial Fibrillation    HPI: Chronic visit  Patient has anxiety and she is on lorazepam and 1/2 pill only and is sleeping well  Patient has a diagnosis of permanent atrial fibrillation.   Patient is on eliquis and has controlled ventricular response.  Patient is CV stable.   she is more stable after PT from stroke. Current Outpatient Medications on File Prior to Visit  Medication Sig Dispense Refill  . atorvastatin (LIPITOR) 40 MG tablet Take 1 tablet (40 mg total) by mouth daily. 90 tablet 3  . ELIQUIS 5 MG TABS tablet Take 1 tablet (5 mg total) by mouth 2 (two) times daily. 60 tablet 12  . metoprolol succinate (TOPROL-XL) 25 MG 24 hr tablet Take one tablet by mouth daily daily in the morning and 0.5 tablet by mouth daily in the evening. 135 tablet 3   No current facility-administered medications on file prior to visit.   Past Medical History:  Diagnosis Date  . A-fib ([place])   . Asthma    as a child  . Gestational diabetes   . Headache   . Hypercholesterolemia   . Hypertension   . Kidney stones   . Obesity   . Osteoarthritis   . Scleroderma, localized   . Stroke ([place]) 09/12/2020   left side weakness/speech affected  . Wears glasses    Past Surgical History:  Procedure Laterality Date  . 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 FRACTURE SURGERY     right hip  . TOTAL HIP ARTHROPLASTY Right 06/08/2015  . TUBAL LIGATION    . WISDOM TOOTH EXTRACTION      Family History  Problem Relation Age of Onset  . Cancer Mother   . Cancer Father   . Hypertension Other    Social History   Socioeconomic History  . Marital  status: Single    Spouse name: Not on file  . Number of children: 3  . Years of education: Not on file  . Highest education level: Not on file  Occupational History    Comment: full time  Tobacco Use  . Smoking status: Never Smoker  . Smokeless tobacco: Never Used  Substance and Sexual Activity  . Alcohol use: No  . Drug use: No  . Sexual activity: Not Currently  Other Topics Concern  . Not on file  Social History Narrative   Lives alone   Right Handed   Drinks no caffeine   Social Determinants of Health   Financial Resource Strain: Not on file  Food Insecurity: Not on file  Transportation Needs: Not on file  Physical Activity: Not on file  Stress: Not on file  Social Connections: Not on file    Review of Systems  Constitutional: Negative for activity change, appetite change and unexpected weight change.  HENT: Negative for congestion and sinus pain.   Eyes: Negative for visual disturbance.  Respiratory: Negative for chest tightness and shortness of breath.   Cardiovascular: Negative for chest pain, palpitations and leg swelling.  Gastrointestinal: Negative for abdominal distention and abdominal pain.  Endocrine: Negative for polyuria.  Genitourinary: Negative  for difficulty urinating, dyspareunia, dysuria and urgency.  Musculoskeletal: Negative for arthralgias and back pain.  Skin: Negative.   Neurological: Negative.   Psychiatric/Behavioral: Negative.      Objective:  BP 130/90   Pulse 76   Temp (!) 97.3 F (36.3 C)   Resp 16   Ht 5\' 5"  (1.651 m)   Wt 257 lb (116.6 kg)   SpO2 97%   BMI 42.77 kg/m   BP/Weight 01/02/2021 12/01/2020 88/50/2774  Systolic BP 128 786 767  Diastolic BP 90 80 88  Wt. (Lbs) 257 254 255  BMI 42.77 42.27 42.43    Physical Exam Vitals reviewed.  Constitutional:      General: She is not in acute distress.    Appearance: Normal appearance. She is obese.  HENT:     Right Ear: Tympanic membrane normal.     Left Ear: Tympanic  membrane normal.  Cardiovascular:     Rate and Rhythm: Normal rate. Rhythm irregular.     Pulses: Normal pulses.     Heart sounds: No murmur heard. No gallop.   Pulmonary:     Effort: Pulmonary effort is normal. No respiratory distress.     Breath sounds: Normal breath sounds. No rales.  Neurological:     Mental Status: She is alert.       Lab Results  Component Value Date   WBC 10.6 (H) 06/10/2015   HGB 9.6 (L) 06/10/2015   HCT 29.4 (L) 06/10/2015   PLT 159 06/10/2015   GLUCOSE 131 (H) 06/10/2015   ALT 20 05/26/2015   AST 18 05/26/2015   NA 139 06/10/2015   K 4.1 06/10/2015   CL 105 06/10/2015   CREATININE 0.48 06/10/2015   BUN 10 06/10/2015   CO2 30 06/10/2015   INR 1.01 05/26/2015      Assessment & Plan:   1. Persistent atrial fibrillation [facility]) Patient has a diagnosis of permanent atrial fibrillation.   Patient is on eliquis and has controlled ventricular response.  Patient is CV stable.  2. Anxiety AN INDIVIDUAL CARE PLANfor anxiety  was established and reinforced today.  The patient's status was assessed using clinical findings on exam, labs, and other diagnostic testing. Patient's success at meeting treatment goals based on disease specific evidence-bassed guidelines and found to be in fair control. RECOMMENDATIONS include maintaining present medicines and treatment.          I spent 15 minutes dedicated to the care of this patient on the date of this encounter to include face-to-face time with the patient, as well as:  Follow-up: Return in about 6 months (around 07/02/2021) for fasting.  An After Visit Summary was printed and given to the patient.  Reinaldo Meeker, MD [facility] [phone]

## 2021-01-03 DIAGNOSIS — D225 Melanocytic nevi of trunk: Secondary | ICD-10-CM | POA: Diagnosis not present

## 2021-01-03 DIAGNOSIS — L814 Other melanin hyperpigmentation: Secondary | ICD-10-CM | POA: Diagnosis not present

## 2021-01-03 DIAGNOSIS — L570 Actinic keratosis: Secondary | ICD-10-CM | POA: Diagnosis not present

## 2021-01-03 DIAGNOSIS — Z85820 Personal history of malignant melanoma of skin: Secondary | ICD-10-CM | POA: Diagnosis not present

## 2021-01-03 DIAGNOSIS — D2239 Melanocytic nevi of other parts of face: Secondary | ICD-10-CM | POA: Diagnosis not present

## 2021-01-18 DIAGNOSIS — E7800 Pure hypercholesterolemia, unspecified: Secondary | ICD-10-CM | POA: Insufficient documentation

## 2021-01-18 DIAGNOSIS — I4891 Unspecified atrial fibrillation: Secondary | ICD-10-CM | POA: Insufficient documentation

## 2021-01-18 DIAGNOSIS — I10 Essential (primary) hypertension: Secondary | ICD-10-CM | POA: Insufficient documentation

## 2021-01-19 ENCOUNTER — Encounter: Payer: Self-pay | Admitting: Cardiology

## 2021-01-19 ENCOUNTER — Ambulatory Visit: Payer: PPO | Admitting: Cardiology

## 2021-01-19 ENCOUNTER — Other Ambulatory Visit: Payer: Self-pay

## 2021-01-19 VITALS — BP 142/80 | HR 74 | Ht 65.0 in | Wt 254.2 lb

## 2021-01-19 DIAGNOSIS — I4891 Unspecified atrial fibrillation: Secondary | ICD-10-CM | POA: Diagnosis not present

## 2021-01-19 DIAGNOSIS — Z8673 Personal history of transient ischemic attack (TIA), and cerebral infarction without residual deficits: Secondary | ICD-10-CM

## 2021-01-19 DIAGNOSIS — E782 Mixed hyperlipidemia: Secondary | ICD-10-CM

## 2021-01-19 DIAGNOSIS — I10 Essential (primary) hypertension: Secondary | ICD-10-CM

## 2021-01-19 DIAGNOSIS — R0683 Snoring: Secondary | ICD-10-CM

## 2021-01-19 DIAGNOSIS — R0789 Other chest pain: Secondary | ICD-10-CM

## 2021-01-19 NOTE — Progress Notes (Signed)
Cardiology Office Note:    Date:  01/19/2021   ID:  Teresa Blair, Teresa Blair 1951/05/31, MRN [id number]  PCP:  Lillard Anes, MD  Cardiologist:  Berniece Salines, DO  Electrophysiologist:  None   Referring MD: Lillard Anes,*   I feel a lot better  History of Present Illness:    Teresa Blair is a 70 y.o. female with a hx of atrial fibrillation, recent CVA, hypertension, the patient was here in the hospital and was admitted on September 12, 2020 discharge on September 13, 2020 at which time she was taking care of 4 acute CVA as well as new onset atrial fibrillation.   I saw the patient in November 2021 at that time we will schedule a TEE/cardioversion at [facility].  She was able to get her TEE which showed evidence of left atrial appendage clot therefore cardioversion was not pursued.  She is here today for follow-up visit.  She tells me since going back to work she is doing great but has been experiencing intermittent chest pain she described the chest pain as chest squeezing sensation.  It comes and goes she is chest pain-free only 50% of the time.  She does not have any associated shortness of breath.  She is happy but the only problem is is her abstract thinking and unable to recognize morning.    Past Medical History:  Diagnosis Date  . A-fib ([place])   . Anxiety 12/01/2020  . Asthma    as a child  . Atrial fibrillation ([place]) 09/16/2020  . Chronic anticoagulation 12/01/2020  . Closed fracture of right acetabulum ([place]) 09/03/2011  . Enthesopathy of hip region 09/03/2011  . Gestational diabetes   . Headache   . History of CVA (cerebrovascular accident) 09/16/2020  . Hypercholesterolemia   . Hypertension   . Kidney stones   . Mixed hyperlipidemia 09/16/2020  . Morbid obesity ([place]) 09/16/2020  . Obesity   . Osteoarthritis   . 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 Primary hypertension 09/16/2020  . 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 Right hip pain 09/03/2011  . Scleroderma, localized   . 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 Stroke ([place]) 09/12/2020   left side weakness/speech affected  . Wears glasses     Past Surgical History:  Procedure Laterality Date  . 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 06/08/2015   Procedure: CONVERSION TO TOTAL HIP, ANTERIOR TOTAL HIP;  Surgeon: Kathryne Hitch, MD;  Location: [place];  Service: Orthopedics;  Laterality: Right;  . FRACTURE SURGERY     right hip  . TOTAL HIP ARTHROPLASTY Right 06/08/2015  . TUBAL LIGATION    . WISDOM TOOTH EXTRACTION      Current Medications: Current Meds  Medication Sig  . atorvastatin (LIPITOR) 40 MG tablet Take 1 tablet (40 mg total) by mouth daily.  Marland Kitchen ELIQUIS 5 MG TABS tablet Take 1 tablet (5 mg total) by mouth 2 (two) times daily.  Marland Kitchen LORazepam (ATIVAN) 0.5 MG tablet Take 1 tablet (0.5 mg total) by mouth 2 (two) times daily as needed for anxiety (try with 1/2 pill first).  . metoprolol succinate (TOPROL-XL) 25 MG 24 hr tablet Take one tablet by mouth daily daily in the morning and 0.5 tablet by mouth daily in the evening.     Allergies:   Other, Sulfa antibiotics, and Sulfamethoxazole   Social History   Socioeconomic History  . Marital status: Single    Spouse name: Not on file  .  Number of children: 3  . Years of education: Not on file  . Highest education level: Not on file  Occupational History    Comment: full time  Tobacco Use  . Smoking status: Never Smoker  . Smokeless tobacco: Never Used  Substance and Sexual Activity  . Alcohol use: No  . Drug use: No  . Sexual activity: Not Currently  Other Topics Concern  . Not on file  Social History Narrative   Lives alone   Right Handed   Drinks no caffeine   Social Determinants of Health   Financial Resource Strain: Not on file  Food Insecurity: Not on file  Transportation Needs: Not on file  Physical Activity: Not on file  Stress: Not on file  Social Connections: Not on  file     Family History: The patient's family history includes Cancer in her father and mother; Hypertension in an other family member.  ROS:   Review of Systems  Constitution: Negative for decreased appetite, fever and weight gain.  HENT: Negative for congestion, ear discharge, hoarse voice and sore throat.   Eyes: Negative for discharge, redness, vision loss in right eye and visual halos.  Cardiovascular: Negative for chest pain, dyspnea on exertion, leg swelling, orthopnea and palpitations.  Respiratory: Negative for cough, hemoptysis, shortness of breath and snoring.   Endocrine: Negative for heat intolerance and polyphagia.  Hematologic/Lymphatic: Negative for bleeding problem. Does not bruise/bleed easily.  Skin: Negative for flushing, nail changes, rash and suspicious lesions.  Musculoskeletal: Negative for arthritis, joint pain, muscle cramps, myalgias, neck pain and stiffness.  Gastrointestinal: Negative for abdominal pain, bowel incontinence, diarrhea and excessive appetite.  Genitourinary: Negative for decreased libido, genital sores and incomplete emptying.  Neurological: Negative for brief paralysis, focal weakness, headaches and loss of balance.  Psychiatric/Behavioral: Negative for altered mental status, depression and suicidal ideas.  Allergic/Immunologic: Negative for HIV exposure and persistent infections.    EKGs/Labs/Other Studies Reviewed:    The following studies were reviewed today:   EKG: None today  Recent Labs: No results found for requested labs within last 8760 hours.  Recent Lipid Panel No results found for: CHOL, TRIG, HDL, CHOLHDL, VLDL, LDLCALC, LDLDIRECT  Physical Exam:    VS:  BP (!) 142/80   Pulse 74   Ht 5\' 5"  (1.651 m)   Wt 254 lb 3.2 oz (115.3 kg)   SpO2 96%   BMI 42.30 kg/m     Wt Readings from Last 3 Encounters:  01/19/21 254 lb 3.2 oz (115.3 kg)  01/02/21 257 lb (116.6 kg)  12/01/20 254 lb (115.2 kg)     GEN: Well nourished,  well developed in no acute distress HEENT: Normal NECK: No JVD; No carotid bruits LYMPHATICS: No lymphadenopathy CARDIAC: S1S2 noted,RRR, no murmurs, rubs, gallops RESPIRATORY:  Clear to auscultation without rales, wheezing or rhonchi  ABDOMEN: Soft, non-tender, non-distended, +bowel sounds, no guarding. EXTREMITIES: No edema, No cyanosis, no clubbing MUSCULOSKELETAL:  No deformity  SKIN: Warm and dry NEUROLOGIC:  Alert and oriented x 3, non-focal PSYCHIATRIC:  Normal affect, good insight  ASSESSMENT:    1. Other chest pain   2. Atrial fibrillation, unspecified type ([place])   3. Snoring   4. Primary hypertension   5. History of CVA (cerebrovascular accident)   6. Mixed hyperlipidemia   7. Morbid obesity ([place])    PLAN:     1.  She is doing well from a cardiovascular standpoint.  She did show me information about her blood pressure at  home which is averaging in the 120s and 110s.  She is slightly elevated here today.  I like to continue her current regimen to avoid any hypotension in this patient.  She will stay on Eliquis 5 mg daily.  At her follow-up visit we will discuss redoing a TEE to see if her intrapedicle has dissolved.  Given her chest pain I like to send the patient for nuclear stress test as she did has risk factors for coronary artery disease.  He still has snoring and these to get a sleep study.  Also with her atrial fibrillation this will help her understand if sleep apnea is playing a role and help this to be controlled.  The patient understands the need to lose weight with diet and exercise. We have discussed specific strategies for this.  Hyperlipidemia - continue with current statin medication.  The patient is in agreement with the above plan. The patient left the office in stable condition.  The patient will follow up in 3 months or sooner if needed.   Medication Adjustments/Labs and Tests Ordered: Current medicines are reviewed at length with the patient  today.  Concerns regarding medicines are outlined above.  Orders Placed This Encounter  Procedures  . MYOCARDIAL PERFUSION IMAGING  . Split night study   No orders of the defined types were placed in this encounter.   Patient Instructions  Medication Instructions:  Your physician recommends that you continue on your current medications as directed. Please refer to the Current Medication list given to you today.  *If you need a refill on your cardiac medications before your next appointment, please call your pharmacy*   Lab Work: None If you have labs (blood work) drawn today and your tests are completely normal, you will receive your results only by: Marland Kitchen MyChart Message (if you have MyChart) OR . A paper copy in the mail If you have any lab test that is abnormal or we need to change your treatment, we will call you to review the results.   Testing/Procedures: Your physician has recommended that you have a sleep study. This test records several body functions during sleep, including: brain activity, eye movement, oxygen and carbon dioxide blood levels, heart rate and rhythm, breathing rate and rhythm, the flow of air through your mouth and nose, snoring, body muscle movements, and chest and belly movement.  Your physician has requested that you have a lexiscan myoview. For further information please visit [email or web address]. Please follow instruction sheet, as given.   Follow-Up: At [facility], you and your health needs are our priority.  As part of our continuing mission to provide you with exceptional heart care, we have created designated Provider Care Teams.  These Care Teams include your primary Cardiologist (physician) and Advanced Practice Providers (APPs -  Physician Assistants and Nurse Practitioners) who all work together to provide you with the care you need, when you need it.  We recommend signing up for the patient portal called "MyChart".  Sign up information is  provided on this After Visit Summary.  MyChart is used to connect with patients for Virtual Visits (Telemedicine).  Patients are able to view lab/test results, encounter notes, upcoming appointments, etc.  Non-urgent messages can be sent to your provider as well.   To learn more about what you can do with MyChart, go to [email or web address].    Your next appointment:   3 month(s)  The format for your next appointment:   In Person  Provider:   Godfrey Pick  Leif Loflin, DO   Other Instructions  Cardiac Nuclear Scan A cardiac nuclear scan is a test that is done to check the flow of blood to your heart. It is done when you are resting and when you are exercising. The test looks for problems such as:  Not enough blood reaching a portion of the heart.  The heart muscle not working as it should. You may need this test if:  You have heart disease.  You have had lab results that are not normal.  You have had heart surgery or a balloon procedure to open up blocked arteries (angioplasty).  You have chest pain.  You have shortness of breath. In this test, a special dye (tracer) is put into your bloodstream. The tracer will travel to your heart. A camera will then take pictures of your heart to see how the tracer moves through your heart. This test is usually done at a hospital and takes 2-4 hours. Tell a doctor about:  Any allergies you have.  All medicines you are taking, including vitamins, herbs, eye drops, creams, and over-the-counter medicines.  Any problems you or family members have had with anesthetic medicines.  Any blood disorders you have.  Any surgeries you have had.  Any medical conditions you have.  Whether you are pregnant or may be pregnant. What are the risks? Generally, this is a safe test. However, problems may occur, such as:  Serious chest pain and heart attack. This is only a risk if the stress portion of the test is done.  Rapid heartbeat.  A feeling of warmth  in your chest. This feeling usually does not last long.  Allergic reaction to the tracer. What happens before the test?  Ask your doctor about changing or stopping your normal medicines. This is important.  Follow instructions from your doctor about what you cannot eat or drink.  Remove your jewelry on the day of the test. What happens during the test?  An IV tube will be inserted into one of your veins.  Your doctor will give you a small amount of tracer through the IV tube.  You will wait for 20-40 minutes while the tracer moves through your bloodstream.  Your heart will be monitored with an electrocardiogram (ECG).  You will lie down on an exam table.  Pictures of your heart will be taken for about 15-20 minutes.  You may also have a stress test. For this test, one of these things may be done: ? You will be asked to exercise on a treadmill or a stationary bike. ? You will be given medicines that will make your heart work harder. This is done if you are unable to exercise.  When blood flow to your heart has peaked, a tracer will again be given through the IV tube.  After 20-40 minutes, you will get back on the exam table. More pictures will be taken of your heart.  Depending on the tracer that is used, more pictures may need to be taken 3-4 hours later.  Your IV tube will be removed when the test is over. The test may vary among doctors and hospitals. What happens after the test?  Ask your doctor: ? Whether you can return to your normal schedule, including diet, activities, and medicines. ? Whether you should drink more fluids. This will help to remove the tracer from your body. Drink enough fluid to keep your pee (urine) pale yellow.  Ask your doctor, or the department that is doing the  test: ? When will my results be ready? ? How will I get my results? Summary  A cardiac nuclear scan is a test that is done to check the flow of blood to your heart.  Tell your  doctor whether you are pregnant or may be pregnant.  Before the test, ask your doctor about changing or stopping your normal medicines. This is important.  Ask your doctor whether you can return to your normal activities. You may be asked to drink more fluids. This information is not intended to replace advice given to you by your health care provider. Make sure you discuss any questions you have with your health care provider. Document Revised: 03/04/2019 Document Reviewed: 04/28/2018 Elsevier Patient Education  2021 [place].      Adopting a Healthy Lifestyle.  Know what a healthy weight is for you (roughly BMI <25) and aim to maintain this   Aim for 7+ servings of fruits and vegetables daily   65-80+ fluid ounces of water or unsweet tea for healthy kidneys   Limit to max 1 drink of alcohol per day; avoid smoking/tobacco   Limit animal fats in diet for cholesterol and heart health - choose grass fed whenever available   Avoid highly processed foods, and foods high in saturated/trans fats   Aim for low stress - take time to unwind and care for your mental health   Aim for 150 min of moderate intensity exercise weekly for heart health, and weights twice weekly for bone health   Aim for 7-9 hours of sleep daily   When it comes to diets, agreement about the perfect plan isnt easy to find, even among the experts. Experts at the [employer] developed an idea known as the Healthy Eating Plate. Just imagine a plate divided into logical, healthy portions.   The emphasis is on diet quality:   Load up on vegetables and fruits - one-half of your plate: Aim for color and variety, and remember that potatoes dont count.   Go for whole grains - one-quarter of your plate: Whole wheat, barley, wheat berries, quinoa, oats, brown rice, and foods made with them. If you want pasta, go with whole wheat pasta.   Protein power - one-quarter of your plate: Fish, chicken,  beans, and nuts are all healthy, versatile protein sources. Limit red meat.   The diet, however, does go beyond the plate, offering a few other suggestions.   Use healthy plant oils, such as olive, canola, soy, corn, sunflower and peanut. Check the labels, and avoid partially hydrogenated oil, which have unhealthy trans fats.   If youre thirsty, drink water. Coffee and tea are good in moderation, but skip sugary drinks and limit milk and dairy products to one or two daily servings.   The type of carbohydrate in the diet is more important than the amount. Some sources of carbohydrates, such as vegetables, fruits, whole grains, and beans-are healthier than others.   Finally, stay active  Signed, Berniece Salines, DO  01/19/2021 1:13 PM    [facility]

## 2021-01-19 NOTE — Patient Instructions (Signed)
Medication Instructions:  Your physician recommends that you continue on your current medications as directed. Please refer to the Current Medication list given to you today.  *If you need a refill on your cardiac medications before your next appointment, please call your pharmacy*   Lab Work: None If you have labs (blood work) drawn today and your tests are completely normal, you will receive your results only by: Marland Kitchen MyChart Message (if you have MyChart) OR . A paper copy in the mail If you have any lab test that is abnormal or we need to change your treatment, we will call you to review the results.   Testing/Procedures: Your physician has recommended that you have a sleep study. This test records several body functions during sleep, including: brain activity, eye movement, oxygen and carbon dioxide blood levels, heart rate and rhythm, breathing rate and rhythm, the flow of air through your mouth and nose, snoring, body muscle movements, and chest and belly movement.  Your physician has requested that you have a lexiscan myoview. For further information please visit [email or web address]. Please follow instruction sheet, as given.   Follow-Up: At [facility], you and your health needs are our priority.  As part of our continuing mission to provide you with exceptional heart care, we have created designated Provider Care Teams.  These Care Teams include your primary Cardiologist (physician) and Advanced Practice Providers (APPs -  Physician Assistants and Nurse Practitioners) who all work together to provide you with the care you need, when you need it.  We recommend signing up for the patient portal called "MyChart".  Sign up information is provided on this After Visit Summary.  MyChart is used to connect with patients for Virtual Visits (Telemedicine).  Patients are able to view lab/test results, encounter notes, upcoming appointments, etc.  Non-urgent messages can be sent to your provider  as well.   To learn more about what you can do with MyChart, go to [email or web address].    Your next appointment:   3 month(s)  The format for your next appointment:   In Person  Provider:   Berniece Salines, DO   Other Instructions  Cardiac Nuclear Scan A cardiac nuclear scan is a test that is done to check the flow of blood to your heart. It is done when you are resting and when you are exercising. The test looks for problems such as:  Not enough blood reaching a portion of the heart.  The heart muscle not working as it should. You may need this test if:  You have heart disease.  You have had lab results that are not normal.  You have had heart surgery or a balloon procedure to open up blocked arteries (angioplasty).  You have chest pain.  You have shortness of breath. In this test, a special dye (tracer) is put into your bloodstream. The tracer will travel to your heart. A camera will then take pictures of your heart to see how the tracer moves through your heart. This test is usually done at a hospital and takes 2-4 hours. Tell a doctor about:  Any allergies you have.  All medicines you are taking, including vitamins, herbs, eye drops, creams, and over-the-counter medicines.  Any problems you or family members have had with anesthetic medicines.  Any blood disorders you have.  Any surgeries you have had.  Any medical conditions you have.  Whether you are pregnant or may be pregnant. What are the risks? Generally, this is a safe test. However,  problems may occur, such as:  Serious chest pain and heart attack. This is only a risk if the stress portion of the test is done.  Rapid heartbeat.  A feeling of warmth in your chest. This feeling usually does not last long.  Allergic reaction to the tracer. What happens before the test?  Ask your doctor about changing or stopping your normal medicines. This is important.  Follow instructions from your doctor  about what you cannot eat or drink.  Remove your jewelry on the day of the test. What happens during the test?  An IV tube will be inserted into one of your veins.  Your doctor will give you a small amount of tracer through the IV tube.  You will wait for 20-40 minutes while the tracer moves through your bloodstream.  Your heart will be monitored with an electrocardiogram (ECG).  You will lie down on an exam table.  Pictures of your heart will be taken for about 15-20 minutes.  You may also have a stress test. For this test, one of these things may be done: ? You will be asked to exercise on a treadmill or a stationary bike. ? You will be given medicines that will make your heart work harder. This is done if you are unable to exercise.  When blood flow to your heart has peaked, a tracer will again be given through the IV tube.  After 20-40 minutes, you will get back on the exam table. More pictures will be taken of your heart.  Depending on the tracer that is used, more pictures may need to be taken 3-4 hours later.  Your IV tube will be removed when the test is over. The test may vary among doctors and hospitals. What happens after the test?  Ask your doctor: ? Whether you can return to your normal schedule, including diet, activities, and medicines. ? Whether you should drink more fluids. This will help to remove the tracer from your body. Drink enough fluid to keep your pee (urine) pale yellow.  Ask your doctor, or the department that is doing the test: ? When will my results be ready? ? How will I get my results? Summary  A cardiac nuclear scan is a test that is done to check the flow of blood to your heart.  Tell your doctor whether you are pregnant or may be pregnant.  Before the test, ask your doctor about changing or stopping your normal medicines. This is important.  Ask your doctor whether you can return to your normal activities. You may be asked to drink more  fluids. This information is not intended to replace advice given to you by your health care provider. Make sure you discuss any questions you have with your health care provider. Document Revised: 03/04/2019 Document Reviewed: 04/28/2018 Elsevier Patient Education  [place].

## 2021-01-20 ENCOUNTER — Telehealth: Payer: Self-pay | Admitting: *Deleted

## 2021-01-20 NOTE — Telephone Encounter (Signed)
PA for sleep study faxed to [facility] @ [phone].

## 2021-01-24 ENCOUNTER — Telehealth: Payer: Self-pay | Admitting: *Deleted

## 2021-01-24 NOTE — Telephone Encounter (Signed)
Staff message sent to Dolores Hoose, RN ok to schedule sleep study. [facility] received. Auth # [id number]. Valid dates 02/07/21 to 05/08/21.

## 2021-01-25 ENCOUNTER — Telehealth: Payer: Self-pay

## 2021-01-25 NOTE — Telephone Encounter (Signed)
-----   Message from Lauralee Evener, [place] sent at 01/24/2021  9:02 AM EST ----- Ok to schedule sleep study. [facility] auth received. Auth # [id number]. Valid dates 02/07/21 to 05/08/21. ----- Message ----- From: Orvan July, RN Sent: 01/19/2021   1:08 PM EST To: [facility] Sleep Studies  Needs precert for sleep study.  Thank you,    [facility]

## 2021-01-25 NOTE — Telephone Encounter (Signed)
Left message for patient to return our call about her sleep study on Thursday April 21st.

## 2021-01-27 ENCOUNTER — Telehealth: Payer: Self-pay

## 2021-01-27 NOTE — Telephone Encounter (Signed)
Left message for patient to return our call.

## 2021-02-01 ENCOUNTER — Telehealth (HOSPITAL_COMMUNITY): Payer: Self-pay | Admitting: *Deleted

## 2021-02-01 NOTE — Telephone Encounter (Signed)
Cell voicemail box is full.  Left message on home voicemail in reference to upcoming appointment scheduled for 02/08/21. Phone number given for a call back so details instructions can be given. Veronia Beets

## 2021-02-02 ENCOUNTER — Telehealth: Payer: Self-pay

## 2021-02-02 NOTE — Telephone Encounter (Signed)
Left message on patient's home phone for her to return our call. Also called her cell phone. No answer, mailbox is full can't leave a message.

## 2021-02-02 NOTE — Telephone Encounter (Signed)
Left message on her home phone for her to return our call. Also called her cell phone no answer and mailbox is full.

## 2021-02-08 ENCOUNTER — Other Ambulatory Visit: Payer: Self-pay

## 2021-02-08 ENCOUNTER — Ambulatory Visit (INDEPENDENT_AMBULATORY_CARE_PROVIDER_SITE_OTHER): Payer: PPO

## 2021-02-08 DIAGNOSIS — I4891 Unspecified atrial fibrillation: Secondary | ICD-10-CM | POA: Diagnosis not present

## 2021-02-08 DIAGNOSIS — R0789 Other chest pain: Secondary | ICD-10-CM | POA: Diagnosis not present

## 2021-02-09 ENCOUNTER — Ambulatory Visit: Payer: PPO

## 2021-02-09 DIAGNOSIS — R0789 Other chest pain: Secondary | ICD-10-CM | POA: Diagnosis not present

## 2021-02-09 DIAGNOSIS — I4891 Unspecified atrial fibrillation: Secondary | ICD-10-CM | POA: Diagnosis not present

## 2021-02-09 LAB — MYOCARDIAL PERFUSION IMAGING
LV dias vol: 86 mL (ref 46–106)
LV sys vol: 28 mL
Peak HR: 116 {beats}/min
Rest HR: 74 {beats}/min
SDS: 0
SRS: 4
SSS: 4
TID: 0.88

## 2021-02-09 MED ORDER — REGADENOSON 0.4 MG/5ML IV SOLN
0.4000 mg | Freq: Once | INTRAVENOUS | Status: AC
  Administered 2021-02-09: 0.4 mg via INTRAVENOUS

## 2021-02-09 MED ORDER — TECHNETIUM TC 99M TETROFOSMIN IV KIT
31.6000 | PACK | Freq: Once | INTRAVENOUS | Status: AC | PRN
  Administered 2021-02-09: 31.6 via INTRAVENOUS

## 2021-02-09 MED ORDER — TECHNETIUM TC 99M TETROFOSMIN IV KIT
32.1000 | PACK | Freq: Once | INTRAVENOUS | Status: AC | PRN
  Administered 2021-02-08: 32.1 via INTRAVENOUS

## 2021-02-10 ENCOUNTER — Telehealth: Payer: Self-pay

## 2021-02-10 NOTE — Telephone Encounter (Signed)
Patient notified of stress test results

## 2021-02-27 ENCOUNTER — Other Ambulatory Visit: Payer: Self-pay

## 2021-02-27 ENCOUNTER — Ambulatory Visit (INDEPENDENT_AMBULATORY_CARE_PROVIDER_SITE_OTHER): Payer: PPO | Admitting: Legal Medicine

## 2021-02-27 ENCOUNTER — Encounter: Payer: Self-pay | Admitting: Legal Medicine

## 2021-02-27 VITALS — BP 136/70 | HR 102 | Temp 98.7°F | Resp 16 | Ht 65.0 in | Wt 254.0 lb

## 2021-02-27 DIAGNOSIS — R300 Dysuria: Secondary | ICD-10-CM

## 2021-02-27 DIAGNOSIS — N3090 Cystitis, unspecified without hematuria: Secondary | ICD-10-CM | POA: Diagnosis not present

## 2021-02-27 LAB — POCT URINALYSIS DIP (CLINITEK)
Bilirubin, UA: NEGATIVE
Blood, UA: NEGATIVE
Glucose, UA: NEGATIVE mg/dL
Ketones, POC UA: NEGATIVE mg/dL
Nitrite, UA: NEGATIVE
POC PROTEIN,UA: NEGATIVE
Spec Grav, UA: 1.025 (ref 1.010–1.025)
Urobilinogen, UA: 0.2 E.U./dL
pH, UA: 5.5 (ref 5.0–8.0)

## 2021-02-27 MED ORDER — NITROFURANTOIN MACROCRYSTAL 100 MG PO CAPS: 100.0000 mg | ORAL_CAPSULE | Freq: Four times a day (QID) | ORAL | 0 refills | Status: DC

## 2021-02-27 NOTE — Progress Notes (Signed)
Subjective:  Patient ID: Teresa Blair, female    DOB: 12/16/1950  Age: 70 y.o. MRN: [id number]  Chief Complaint  Patient presents with  . Dysuria    Patient states she is having discomfort when urinating and going frequently.    HPI: dysuria, frequency on week, some back pain.  No fever or chills.she does not feel sick.  She is drinking fluids.   Current Outpatient Medications on File Prior to Visit  Medication Sig Dispense Refill  . atorvastatin (LIPITOR) 40 MG tablet Take 1 tablet (40 mg total) by mouth daily. 90 tablet 3  . ELIQUIS 5 MG TABS tablet Take 1 tablet (5 mg total) by mouth 2 (two) times daily. 60 tablet 12  . LORazepam (ATIVAN) 0.5 MG tablet Take 1 tablet (0.5 mg total) by mouth 2 (two) times daily as needed for anxiety (try with 1/2 pill first). 30 tablet 3  . metoprolol succinate (TOPROL-XL) 25 MG 24 hr tablet Take one tablet by mouth daily daily in the morning and 0.5 tablet by mouth daily in the evening. 135 tablet 3   No current facility-administered medications on file prior to visit.   Past Medical History:  Diagnosis Date  . A-fib ([place])   . Anxiety 12/01/2020  . Asthma    as a child  . Atrial fibrillation ([place]) 09/16/2020  . Chronic anticoagulation 12/01/2020  . Closed fracture of right acetabulum ([place]) 09/03/2011  . Enthesopathy of hip region 09/03/2011  . Gestational diabetes   . Headache   . History of CVA (cerebrovascular accident) 09/16/2020  . Hypercholesterolemia   . Hypertension   . Kidney stones   . Mixed hyperlipidemia 09/16/2020  . Morbid obesity ([place]) 09/16/2020  . Obesity   . Osteoarthritis   . 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 Primary hypertension 09/16/2020  . 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 Right hip pain 09/03/2011  . Scleroderma, localized   . 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 Stroke ([place]) 09/12/2020   left side weakness/speech affected  . Wears glasses    Past Surgical  History:  Procedure Laterality Date  . 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 06/08/2015   Procedure: CONVERSION TO TOTAL HIP, ANTERIOR TOTAL HIP;  Surgeon: Kathryne Hitch, MD;  Location: [facility];  Service: Orthopedics;  Laterality: Right;  . FRACTURE SURGERY     right hip  . TOTAL HIP ARTHROPLASTY Right 06/08/2015  . TUBAL LIGATION    . WISDOM TOOTH EXTRACTION      Family History  Problem Relation Age of Onset  . Cancer Mother   . Cancer Father   . Hypertension Other    Social History   Socioeconomic History  . Marital status: Single    Spouse name: Not on file  . Number of children: 3  . Years of education: Not on file  . Highest education level: Not on file  Occupational History    Comment: full time  Tobacco Use  . Smoking status: Never Smoker  . Smokeless tobacco: Never Used  Substance and Sexual Activity  . Alcohol use: No  . Drug use: No  . Sexual activity: Not Currently  Other Topics Concern  . Not on file  Social History Narrative   Lives alone   Right Handed   Drinks no caffeine   Social Determinants of Health   Financial Resource Strain: Not on file  Food Insecurity: Not on file  Transportation Needs: Not on file  Physical Activity: Not on file  Stress: Not on file  Social Connections: Not on file    Review of Systems  Constitutional: Negative for activity change and appetite change.  HENT: Negative for congestion and sinus pain.   Eyes: Negative for visual disturbance.  Respiratory: Negative for chest tightness and shortness of breath.   Cardiovascular: Negative for chest pain, palpitations and leg swelling.  Gastrointestinal: Negative for abdominal distention and abdominal pain.  Endocrine: Negative for polyuria.  Genitourinary: Positive for difficulty urinating and dysuria.  Musculoskeletal: Negative for arthralgias.  Skin: Negative.   Neurological: Negative.   Psychiatric/Behavioral: Negative.      Objective:  BP 136/70 (BP Location: Left Arm,  Patient Position: Sitting, Cuff Size: Normal)   Pulse (!) 102   Temp 98.7 F (37.1 C) (Temporal)   Resp 16   Ht 5\' 5"  (1.651 m)   Wt 254 lb (115.2 kg)   SpO2 96%   BMI 42.27 kg/m   BP/Weight 02/27/2021 04/29/5408 06/26/1913  Systolic BP 782 956 213  Diastolic BP 70 80 90  Wt. (Lbs) 254 254.2 257  BMI 42.27 42.3 42.77    Physical Exam Vitals reviewed.  Constitutional:      Appearance: Normal appearance.  HENT:     Head: Normocephalic.     Right Ear: Tympanic membrane normal.     Left Ear: Tympanic membrane normal.     Nose: Nose normal.     Mouth/Throat:     Mouth: Mucous membranes are moist.     Pharynx: Oropharynx is clear.  Eyes:     Extraocular Movements: Extraocular movements intact.     Conjunctiva/sclera: Conjunctivae normal.     Pupils: Pupils are equal, round, and reactive to light.  Cardiovascular:     Rate and Rhythm: Normal rate and regular rhythm.     Pulses: Normal pulses.     Heart sounds: Normal heart sounds. No murmur heard. No gallop.   Pulmonary:     Effort: Pulmonary effort is normal. No respiratory distress.     Breath sounds: Normal breath sounds. No rales.  Abdominal:     General: Abdomen is flat. Bowel sounds are normal. There is no distension.     Palpations: Abdomen is soft.     Tenderness: There is no abdominal tenderness.  Musculoskeletal:        General: Normal range of motion.     Cervical back: Normal range of motion and neck supple.  Skin:    General: Skin is warm.     Capillary Refill: Capillary refill takes less than 2 seconds.  Neurological:     General: No focal deficit present.     Mental Status: She is alert and oriented to person, place, and time. Mental status is at baseline.  Psychiatric:        Mood and Affect: Mood normal.        Thought Content: Thought content normal.       Lab Results  Component Value Date   WBC 10.6 (H) 06/10/2015   HGB 9.6 (L) 06/10/2015   HCT 29.4 (L) 06/10/2015   PLT 159 06/10/2015    GLUCOSE 131 (H) 06/10/2015   ALT 20 05/26/2015   AST 18 05/26/2015   NA 139 06/10/2015   K 4.1 06/10/2015   CL 105 06/10/2015   CREATININE 0.48 06/10/2015   BUN 10 06/10/2015   CO2 30 06/10/2015   INR 1.01 05/26/2015      Assessment & Plan:   1. Dysuria - POCT URINALYSIS DIP (CLINITEK) Patient  has UTI and treated with macrobid     Orders Placed This Encounter  Procedures  . Urine Culture  . POCT URINALYSIS DIP (CLINITEK)      I spent 20 minutes dedicated to the care of this patient on the date of this encounter to include face-to-face time with the patient, as well as:   Follow-up: Return if symptoms worsen or fail to improve.  An After Visit Summary was printed and given to the patient.  Reinaldo Meeker, MD [facility] [phone]

## 2021-02-28 LAB — URINE CULTURE

## 2021-03-01 NOTE — Progress Notes (Signed)
Urine culture negative ?lp

## 2021-03-13 ENCOUNTER — Encounter: Payer: Self-pay | Admitting: Legal Medicine

## 2021-03-13 ENCOUNTER — Ambulatory Visit (INDEPENDENT_AMBULATORY_CARE_PROVIDER_SITE_OTHER): Payer: PPO | Admitting: Legal Medicine

## 2021-03-13 ENCOUNTER — Other Ambulatory Visit: Payer: Self-pay

## 2021-03-13 VITALS — BP 150/68 | HR 95 | Temp 96.5°F | Resp 16 | Ht 65.0 in | Wt 257.0 lb

## 2021-03-13 DIAGNOSIS — N3090 Cystitis, unspecified without hematuria: Secondary | ICD-10-CM | POA: Diagnosis not present

## 2021-03-13 LAB — POCT URINALYSIS DIP (CLINITEK)
Bilirubin, UA: NEGATIVE
Blood, UA: NEGATIVE
Glucose, UA: NEGATIVE mg/dL
Ketones, POC UA: NEGATIVE mg/dL
Nitrite, UA: NEGATIVE
POC PROTEIN,UA: NEGATIVE
Spec Grav, UA: 1.01 (ref 1.010–1.025)
Urobilinogen, UA: 0.2 E.U./dL
pH, UA: 6 (ref 5.0–8.0)

## 2021-03-13 MED ORDER — DOXYCYCLINE HYCLATE 100 MG PO TABS: 100.0000 mg | ORAL_TABLET | Freq: Two times a day (BID) | ORAL | 0 refills | Status: DC

## 2021-03-13 NOTE — Progress Notes (Signed)
Subjective:  Patient ID: Teresa Blair, female    DOB: 1951/06/08  Age: 70 y.o. MRN: [id number]  Chief Complaint  Patient presents with  . Urinary Tract Infection    HPI: complains of dysuria and frequency, restarted Friday with back pain.  No incontinence.  no fever or chills. She has history of renal calculus.   Current Outpatient Medications on File Prior to Visit  Medication Sig Dispense Refill  . atorvastatin (LIPITOR) 40 MG tablet Take 1 tablet (40 mg total) by mouth daily. 90 tablet 3  . ELIQUIS 5 MG TABS tablet Take 1 tablet (5 mg total) by mouth 2 (two) times daily. 60 tablet 12  . LORazepam (ATIVAN) 0.5 MG tablet Take 1 tablet (0.5 mg total) by mouth 2 (two) times daily as needed for anxiety (try with 1/2 pill first). 30 tablet 3  . metoprolol succinate (TOPROL-XL) 25 MG 24 hr tablet Take one tablet by mouth daily daily in the morning and 0.5 tablet by mouth daily in the evening. 135 tablet 3   No current facility-administered medications on file prior to visit.   Past Medical History:  Diagnosis Date  . A-fib ([place])   . Anxiety 12/01/2020  . Asthma    as a child  . Atrial fibrillation ([place]) 09/16/2020  . Chronic anticoagulation 12/01/2020  . Closed fracture of right acetabulum ([place]) 09/03/2011  . Enthesopathy of hip region 09/03/2011  . Gestational diabetes   . Headache   . History of CVA (cerebrovascular accident) 09/16/2020  . Hypercholesterolemia   . Hypertension   . Kidney stones   . Mixed hyperlipidemia 09/16/2020  . Morbid obesity ([place]) 09/16/2020  . Obesity   . Osteoarthritis   . 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 Primary hypertension 09/16/2020  . 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 Right hip pain 09/03/2011  . Scleroderma, localized   . 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 Stroke ([place]) 09/12/2020   left side weakness/speech affected  . Wears glasses    Past Surgical History:  Procedure Laterality Date  .  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 06/08/2015   Procedure: CONVERSION TO TOTAL HIP, ANTERIOR TOTAL HIP;  Surgeon: Kathryne Hitch, MD;  Location: [facility];  Service: Orthopedics;  Laterality: Right;  . FRACTURE SURGERY     right hip  . TOTAL HIP ARTHROPLASTY Right 06/08/2015  . TUBAL LIGATION    . WISDOM TOOTH EXTRACTION      Family History  Problem Relation Age of Onset  . Cancer Mother   . Cancer Father   . Hypertension Other    Social History   Socioeconomic History  . Marital status: Single    Spouse name: Not on file  . Number of children: 3  . Years of education: Not on file  . Highest education level: Not on file  Occupational History    Comment: full time  Tobacco Use  . Smoking status: Never Smoker  . Smokeless tobacco: Never Used  Vaping Use  . Vaping Use: Never used  Substance and Sexual Activity  . Alcohol use: No  . Drug use: No  . Sexual activity: Not Currently  Other Topics Concern  . Not on file  Social History Narrative   Lives alone   Right Handed   Drinks no caffeine   Social Determinants of Health   Financial Resource Strain: Not on file  Food Insecurity: Not on file  Transportation Needs: Not on file  Physical Activity: Not on file  Stress: Not on file  Social Connections: Not on file    Review of Systems  Constitutional: Negative for activity change and appetite change.  HENT: Negative for congestion and sinus pain.   Eyes: Negative for visual disturbance.  Respiratory: Negative for chest tightness and shortness of breath.   Cardiovascular: Negative for chest pain, palpitations and leg swelling.  Gastrointestinal: Negative for abdominal distention and abdominal pain.  Endocrine: Negative for polyuria.  Genitourinary: Negative.   Musculoskeletal: Negative for arthralgias and back pain.  Neurological: Negative.   Psychiatric/Behavioral: Negative.      Objective:  BP (!) 150/68   Pulse 95   Temp (!) 96.5 F (35.8 C)   Resp 16   Ht 5\' 5"   (1.651 m)   Wt 257 lb (116.6 kg)   SpO2 96%   BMI 42.77 kg/m   BP/Weight 03/13/2021 02/27/2021 1/60/7371  Systolic BP 062 694 854  Diastolic BP 68 70 80  Wt. (Lbs) 257 254 254.2  BMI 42.77 42.27 42.3    Physical Exam Vitals reviewed.  Constitutional:      Appearance: Normal appearance.  HENT:     Right Ear: Tympanic membrane normal.     Left Ear: Tympanic membrane normal.     Mouth/Throat:     Mouth: Mucous membranes are moist.     Pharynx: Oropharynx is clear.  Eyes:     Extraocular Movements: Extraocular movements intact.     Conjunctiva/sclera: Conjunctivae normal.     Pupils: Pupils are equal, round, and reactive to light.  Cardiovascular:     Rate and Rhythm: Normal rate and regular rhythm.     Pulses: Normal pulses.     Heart sounds: No murmur heard. No gallop.   Pulmonary:     Effort: Pulmonary effort is normal. No respiratory distress.     Breath sounds: Normal breath sounds. No rales.  Abdominal:     General: Abdomen is flat. Bowel sounds are normal. There is no distension.     Palpations: Abdomen is soft.     Tenderness: There is no abdominal tenderness.  Musculoskeletal:        General: Tenderness (flnk) present.     Cervical back: Normal range of motion and neck supple.  Skin:    General: Skin is warm and dry.     Capillary Refill: Capillary refill takes less than 2 seconds.  Neurological:     General: No focal deficit present.     Mental Status: She is alert and oriented to person, place, and time. Mental status is at baseline.      Lab Results  Component Value Date   WBC 10.6 (H) 06/10/2015   HGB 9.6 (L) 06/10/2015   HCT 29.4 (L) 06/10/2015   PLT 159 06/10/2015   GLUCOSE 131 (H) 06/10/2015   ALT 20 05/26/2015   AST 18 05/26/2015   NA 139 06/10/2015   K 4.1 06/10/2015   CL 105 06/10/2015   CREATININE 0.48 06/10/2015   BUN 10 06/10/2015   CO2 30 06/10/2015   INR 1.01 05/26/2015      Assessment & Plan:   1. Cystitis - POCT URINALYSIS  DIP (CLINITEK) - Urine Culture She has positive urine and we will treat with doxycycline for 10 days.  We discussed the possible need for CT abdomen and pelvis for stones.    Orders Placed This Encounter  Procedures  . Urine Culture  . POCT URINALYSIS DIP (CLINITEK)     Follow-up: Return if symptoms worsen or  fail to improve.  An After Visit Summary was printed and given to the patient.  Reinaldo Meeker, MD [facility] [phone]

## 2021-03-15 LAB — URINE CULTURE

## 2021-03-15 NOTE — Progress Notes (Signed)
Mixed urogenital flora < 10,000 colonies- so negative lp

## 2021-03-16 ENCOUNTER — Ambulatory Visit (HOSPITAL_BASED_OUTPATIENT_CLINIC_OR_DEPARTMENT_OTHER): Payer: PPO | Attending: Cardiology | Admitting: Cardiovascular Disease

## 2021-03-16 ENCOUNTER — Other Ambulatory Visit: Payer: Self-pay

## 2021-03-16 DIAGNOSIS — G4733 Obstructive sleep apnea (adult) (pediatric): Secondary | ICD-10-CM | POA: Diagnosis not present

## 2021-03-16 DIAGNOSIS — G4736 Sleep related hypoventilation in conditions classified elsewhere: Secondary | ICD-10-CM | POA: Insufficient documentation

## 2021-03-16 DIAGNOSIS — Z792 Long term (current) use of antibiotics: Secondary | ICD-10-CM | POA: Diagnosis not present

## 2021-03-16 DIAGNOSIS — Z7901 Long term (current) use of anticoagulants: Secondary | ICD-10-CM | POA: Diagnosis not present

## 2021-03-16 DIAGNOSIS — Z79899 Other long term (current) drug therapy: Secondary | ICD-10-CM | POA: Insufficient documentation

## 2021-03-16 DIAGNOSIS — R0683 Snoring: Secondary | ICD-10-CM | POA: Insufficient documentation

## 2021-03-16 DIAGNOSIS — I4819 Other persistent atrial fibrillation: Secondary | ICD-10-CM | POA: Diagnosis not present

## 2021-03-30 ENCOUNTER — Encounter (HOSPITAL_BASED_OUTPATIENT_CLINIC_OR_DEPARTMENT_OTHER): Payer: Self-pay | Admitting: Cardiovascular Disease

## 2021-03-30 NOTE — Procedures (Signed)
    Patient Name: Teresa Blair, Teresa Blair Date: 03/16/2021 Gender: Female D.O.B: 1950-11-29 Age (years): 35 Referring Provider: Godfrey Pick Tobb DO Height (inches): 65 Interpreting Physician: Shelva Majestic MD, ABSM Weight (lbs): 250 RPSGT: Jorge Ny BMI: 42 MRN: [id number] Neck Size: 17.00  CLINICAL INFORMATION Sleep Study Type: NPSG  Indication for sleep study: Hypertension, Obesity, Snoring. Atrial fibrillation  Epworth Sleepiness Score: 2  SLEEP STUDY TECHNIQUE As per the AASM Manual for the Scoring of Sleep and Associated Events v2.3 (April 2016) with a hypopnea requiring 4% desaturations.  The channels recorded and monitored were frontal, central and occipital EEG, electrooculogram (EOG), submentalis EMG (chin), nasal and oral airflow, thoracic and abdominal wall motion, anterior tibialis EMG, snore microphone, electrocardiogram, and pulse oximetry.  MEDICATIONS atorvastatin (LIPITOR) 40 MG tablet doxycycline (VIBRA-TABS) 100 MG tablet ELIQUIS 5 MG TABS tablet LORazepam (ATIVAN) 0.5 MG tablet metoprolol succinate (TOPROL-XL) 25 MG 24 hr tablet Medications self-administered by patient taken the night of the study : LORAZEPAM  SLEEP ARCHITECTURE The study was initiated at 10:51:18 PM and ended at 5:08:59 AM.  Sleep onset time was 12.2 minutes and the sleep efficiency was 94.8%%. The total sleep time was 358 minutes.  Stage REM latency was 72.0 minutes.  The patient spent 1.8%% of the night in stage N1 sleep, 79.5%% in stage N2 sleep, 0.0%% in stage N3 and 18.7% in REM.  Alpha intrusion was absent.  Supine sleep was 47.35%.  RESPIRATORY PARAMETERS The overall apnea/hypopnea index (AHI) was 13.2 per hour. The respiratory disturbance index (RDI) was 13.6/h. There were 1 total apneas, including 0 obstructive, 1 central and 0 mixed apneas. There were 78 hypopneas and 2 RERAs.  The AHI during Stage REM sleep was 58.2 per hour.  AHI while supine was 26.2 per  hour.  The mean oxygen saturation was 91.6%. The minimum SpO2 during sleep was 73.0%.  Moderate snoring was noted during this study.  CARDIAC DATA The 2 lead EKG demonstrated atrial fibrillation. The mean heart rate was 80.1 beats per minute. Other EKG findings include: PVCs.  LEG MOVEMENT DATA The total PLMS were 0 with a resulting PLMS index of 0.0. Associated arousal with leg movement index was 1.5 .  IMPRESSIONS - Mild obstructive sleep apnea oversll (AHI 13.2/h; RDI 13.6/h); however, sleep apnea was severe during REM sleep (AHI 58.2/h). - No significant central sleep apnea occurred during this study (CAI = 0.2/h). - Significant  oxygen desaturation to a nadir of 73.0%. - The patient snored with moderate snoring volume. - EKG findings include atrial fibrillation, PVCs. - Clinically significant periodic limb movements did not occur during sleep. No significant associated arousals.  DIAGNOSIS - Obstructive Sleep Apnea (G47.33) - Nocturnal Hypoxemia (G47.36)  RECOMMENDATIONS - In this patient with significant cardiovascular comorbidities recommend an in-lab therapeutic CPAP titration to determine optimal pressure required to alleviate sleep disordered breathing. - Effort should be made to optimize nasal  and oropharyngeal patency. - Positional therapy avoiding supine position during sleep. - Avoid alcohol, sedatives and other CNS depressants that may worsen sleep apnea and disrupt normal sleep architecture. - Sleep hygiene should be reviewed to assess factors that may improve sleep quality. - Weight management (BMI 42) and regular exercise should be initiated or continued if appropriate.  [Electronically signed] 03/30/2021 08:36 AM  Shelva Majestic MD, [facility], [place], American Board of Sleep Medicine   NPI: [id number] [place] PH: [phone]   FX: [phone] [place]

## 2021-03-31 ENCOUNTER — Encounter: Payer: Self-pay | Admitting: Legal Medicine

## 2021-03-31 DIAGNOSIS — G4733 Obstructive sleep apnea (adult) (pediatric): Secondary | ICD-10-CM

## 2021-03-31 HISTORY — DX: Obstructive sleep apnea (adult) (pediatric): G47.33

## 2021-04-06 ENCOUNTER — Telehealth: Payer: Self-pay | Admitting: *Deleted

## 2021-04-06 NOTE — Telephone Encounter (Signed)
-----   Message from Troy Sine, MD sent at 03/30/2021  8:41 AM EDT ----- Mariann Laster, please notify pt and schedule for CPAP titration

## 2021-04-21 ENCOUNTER — Telehealth: Payer: Self-pay | Admitting: *Deleted

## 2021-04-21 NOTE — Telephone Encounter (Signed)
Left message to return a call to discuss sleep study results and recommendations. 

## 2021-04-21 NOTE — Telephone Encounter (Signed)
-----   Message from Troy Sine, MD sent at 03/30/2021  8:41 AM EDT ----- Mariann Laster, please notify pt and schedule for CPAP titration

## 2021-04-26 ENCOUNTER — Ambulatory Visit: Payer: PPO | Admitting: Cardiology

## 2021-04-26 ENCOUNTER — Other Ambulatory Visit: Payer: Self-pay

## 2021-04-26 ENCOUNTER — Encounter: Payer: Self-pay | Admitting: Cardiology

## 2021-04-26 VITALS — BP 142/80 | HR 90 | Ht 65.0 in | Wt 254.8 lb

## 2021-04-26 DIAGNOSIS — I4819 Other persistent atrial fibrillation: Secondary | ICD-10-CM

## 2021-04-26 DIAGNOSIS — Z8673 Personal history of transient ischemic attack (TIA), and cerebral infarction without residual deficits: Secondary | ICD-10-CM | POA: Insufficient documentation

## 2021-04-26 DIAGNOSIS — G4733 Obstructive sleep apnea (adult) (pediatric): Secondary | ICD-10-CM | POA: Diagnosis not present

## 2021-04-26 DIAGNOSIS — E785 Hyperlipidemia, unspecified: Secondary | ICD-10-CM | POA: Insufficient documentation

## 2021-04-26 DIAGNOSIS — I10 Essential (primary) hypertension: Secondary | ICD-10-CM | POA: Diagnosis not present

## 2021-04-26 DIAGNOSIS — I4891 Unspecified atrial fibrillation: Secondary | ICD-10-CM | POA: Diagnosis not present

## 2021-04-26 NOTE — Patient Instructions (Addendum)
Medication Instructions:  Your physician recommends that you continue on your current medications as directed. Please refer to the Current Medication list given to you today.  *If you need a refill on your cardiac medications before your next appointment, please call your pharmacy*   Lab Work: Your physician recommends that you return for lab work: TODAY: Lpa, Lipids BEFORE TEE/DCCV: BMET, Mag, CBC If you have labs (blood work) drawn today and your tests are completely normal, you will receive your results only by: Marland Kitchen MyChart Message (if you have MyChart) OR . A paper copy in the mail If you have any lab test that is abnormal or we need to change your treatment, we will call you to review the results.   Testing/Procedures: Your physician has requested that you have a TEE/Cardioversion. During a TEE, sound waves are used to create images of your heart. It provides your doctor with information about the size and shape of your heart and how well your heart's chambers and valves are working. In this test, a transducer is attached to the end of a flexible tube that is guided down you throat and into your esophagus (the tube leading from your mouth to your stomach) to get a more detailed image of your heart. Once the TEE has determined that a blood clot is not present, the cardioversion begins. Electrical Cardioversion uses a jolt of electricity to your heart either through paddles or wired patches attached to your chest. This is a controlled, usually prescheduled, procedure. This procedure is done at the hospital and you are not awake during the procedure. You usually go home the day of the procedure. Please see the instruction sheet given to you today for more information.  You are scheduled for a TEE/Cardioversion/TEE Cardioversion on June 9th.  Please arrive at the [facility] (Main Entrance A) at [facility]: [address] [place], [place] [address] at 11 am. (1 hour prior to procedure  unless lab work is needed; if lab work is needed arrive 1.5 hours ahead)  DIET: Nothing to eat or drink after midnight except a sip of water with medications (see medication instructions below)  Medication Instructions:  Continue your anticoagulant: Eliquis You will need to continue your anticoagulant after your procedure until you  are told by your  Provider that it is safe to stop   Labs: YOU HAD YOUR LABS DRAWN ON 04/26/2021  You must have a responsible person to drive you home and stay in the waiting area during your procedure. Failure to do so could result in cancellation.  Bring your insurance cards.  *Special Note: Every effort is made to have your procedure done on time. Occasionally there are emergencies that occur at the hospital that may cause delays. Please be patient if a delay does occur.     Follow-Up: At [facility], you and your health needs are our priority.  As part of our continuing mission to provide you with exceptional heart care, we have created designated Provider Care Teams.  These Care Teams include your primary Cardiologist (physician) and Advanced Practice Providers (APPs -  Physician Assistants and Nurse Practitioners) who all work together to provide you with the care you need, when you need it.  We recommend signing up for the patient portal called "MyChart".  Sign up information is provided on this After Visit Summary.  MyChart is used to connect with patients for Virtual Visits (Telemedicine).  Patients are able to view lab/test results, encounter notes, upcoming appointments,  etc.  Non-urgent messages can be sent to your provider as well.   To learn more about what you can do with MyChart, go to [email or web address].    Your next appointment:   2 week(s) after TEE/Cardioversion  The format for your next appointment:   In Person  Provider:   Berniece Salines, DO   Other Instructions

## 2021-04-26 NOTE — H&P (View-Only) (Signed)
Cardiology Office Note:    Date:  04/26/2021   ID:  Teresa Blair, DOB [birth year], MRN [id number]  PCP:  Lillard Anes, MD  Cardiologist:  Berniece Salines, DO  Electrophysiologist:  None   Referring MD: Lillard Anes,*   I am doing okay now  History of Present Illness:    Teresa Blair is a 70 y.o. female with a hx of  atrial fibrillation, recent CVA, hypertension, the patient was here in the hospital and was admitted on September 12, 2020 discharge on September 13, 2020 at which time she was taking care of4acute CVA as well as new onset atrial fibrillation.   I saw the patient in November 2021 at that time we will schedule a TEE/cardioversion at [facility].  She was able to get her TEE which showed evidence of left atrial appendage clot therefore cardioversion was not pursued.  At her last visit on March 19, 2021 she did have some chest pain which I referred the patient for a nuclear stress test.  She did get her nuclear stress test which did not show any evidence of ischemia.  She recently had a mechanical fall while at work thankfully she did not hit her head.  She still is experiencing shortness of breath and fatigue, which I suspect may be related to her atrial fibrillation.  Past Medical History:  Diagnosis Date  . A-fib ([place])   . Anxiety 12/01/2020  . Asthma    as a child  . Atrial fibrillation ([place]) 09/16/2020  . Chronic anticoagulation 12/01/2020  . Closed fracture of right acetabulum ([place]) 09/03/2011  . Enthesopathy of hip region 09/03/2011  . Gestational diabetes   . Headache   . History of CVA (cerebrovascular accident) 09/16/2020  . Hypercholesterolemia   . Hypertension   . Kidney stones   . Mixed hyperlipidemia 09/16/2020  . Morbid obesity ([place]) 09/16/2020  . Obesity   . Osteoarthritis   . 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 Primary hypertension 09/16/2020  . 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 Right hip pain 09/03/2011  . Scleroderma, localized   . 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 Stroke ([place]) 09/12/2020   left side weakness/speech affected  . Wears glasses     Past Surgical History:  Procedure Laterality Date  . 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 06/08/2015   Procedure: CONVERSION TO TOTAL HIP, ANTERIOR TOTAL HIP;  Surgeon: Kathryne Hitch, MD;  Location: [place];  Service: Orthopedics;  Laterality: Right;  . FRACTURE SURGERY     right hip  . TOTAL HIP ARTHROPLASTY Right 06/08/2015  . TUBAL LIGATION    . WISDOM TOOTH EXTRACTION      Current Medications: Current Meds  Medication Sig  . ELIQUIS 5 MG TABS tablet Take 1 tablet (5 mg total) by mouth 2 (two) times daily.  Marland Kitchen LORazepam (ATIVAN) 0.5 MG tablet Take 1 tablet (0.5 mg total) by mouth 2 (two) times daily as needed for anxiety (try with 1/2 pill first).  . methylPREDNISolone (MEDROL DOSEPAK) 4 MG TBPK tablet Take 4 mg by mouth daily.  . metoprolol succinate (TOPROL-XL) 25 MG 24 hr tablet Take one tablet by mouth daily daily in the morning and 0.5 tablet by mouth daily in the evening.  . traMADol (ULTRAM) 50 MG tablet Take 50 mg by mouth every 6 (six) hours as needed.  . [DISCONTINUED] atorvastatin (LIPITOR) 40 MG tablet Take 1 tablet (40 mg total) by mouth daily.  Allergies:   Other, Sulfa antibiotics, and Sulfamethoxazole   Social History   Socioeconomic History  . Marital status: Single    Spouse name: Not on file  . Number of children: 3  . Years of education: Not on file  . Highest education level: Not on file  Occupational History    Comment: full time  Tobacco Use  . Smoking status: Never Smoker  . Smokeless tobacco: Never Used  Vaping Use  . Vaping Use: Never used  Substance and Sexual Activity  . Alcohol use: No  . Drug use: No  . Sexual activity: Not Currently  Other Topics Concern  . Not on file  Social History Narrative   Lives alone   Right Handed   Drinks no caffeine   Social  Determinants of Health   Financial Resource Strain: Not on file  Food Insecurity: Not on file  Transportation Needs: Not on file  Physical Activity: Not on file  Stress: Not on file  Social Connections: Not on file     Family History: The patient's family history includes Cancer in her father and mother; Hypertension in an other family member.  ROS:   Review of Systems  Constitution: Negative for decreased appetite, fever and weight gain.  HENT: Negative for congestion, ear discharge, hoarse voice and sore throat.   Eyes: Negative for discharge, redness, vision loss in right eye and visual halos.  Cardiovascular: Negative for chest pain, dyspnea on exertion, leg swelling, orthopnea and palpitations.  Respiratory: Negative for cough, hemoptysis, shortness of breath and snoring.   Endocrine: Negative for heat intolerance and polyphagia.  Hematologic/Lymphatic: Negative for bleeding problem. Does not bruise/bleed easily.  Skin: Negative for flushing, nail changes, rash and suspicious lesions.  Musculoskeletal: Negative for arthritis, joint pain, muscle cramps, myalgias, neck pain and stiffness.  Gastrointestinal: Negative for abdominal pain, bowel incontinence, diarrhea and excessive appetite.  Genitourinary: Negative for decreased libido, genital sores and incomplete emptying.  Neurological: Negative for brief paralysis, focal weakness, headaches and loss of balance.  Psychiatric/Behavioral: Negative for altered mental status, depression and suicidal ideas.  Allergic/Immunologic: Negative for HIV exposure and persistent infections.    EKGs/Labs/Other Studies Reviewed:    The following studies were reviewed today:   EKG:  The ekg ordered today demonstrates atrial fibrillation with controlled ventricular rate.  Nuclear stress test 03/12/2021  Nuclear stress EF: 67%.  The left ventricular ejection fraction is hyperdynamic (>65%).  There was no ST segment deviation noted during  stress.  This is a low risk study.  No evidence of ischemia or MI.   Recent Labs: No results found for requested labs within last 8760 hours.  Recent Lipid Panel No results found for: CHOL, TRIG, HDL, CHOLHDL, VLDL, LDLCALC, LDLDIRECT  Physical Exam:    VS:  BP (!) 142/80   Pulse 90   Ht 5\' 5"  (1.651 m)   Wt 254 lb 12.8 oz (115.6 kg)   SpO2 98%   BMI 42.40 kg/m     Wt Readings from Last 3 Encounters:  04/26/21 254 lb 12.8 oz (115.6 kg)  03/16/21 250 lb (113.4 kg)  03/13/21 257 lb (116.6 kg)     GEN: Well nourished, well developed in no acute distress HEENT: Normal NECK: No JVD; No carotid bruits LYMPHATICS: No lymphadenopathy CARDIAC: S1S2 noted,RRR, no murmurs, rubs, gallops RESPIRATORY:  Clear to auscultation without rales, wheezing or rhonchi  ABDOMEN: Soft, non-tender, non-distended, +bowel sounds, no guarding. EXTREMITIES: No edema, No cyanosis, no clubbing MUSCULOSKELETAL:  No  deformity  SKIN: Warm and dry NEUROLOGIC:  Alert and oriented x 3, non-focal PSYCHIATRIC:  Normal affect, good insight  ASSESSMENT:    1. Persistent atrial fibrillation ([place])   2. History of cardioembolic cerebrovascular accident (CVA)   3. Hyperlipidemia, unspecified hyperlipidemia type   4. Obstructive sleep apnea   5. Morbid obesity ([place])   6. Hypertension, unspecified type    PLAN:    She is in atrial fibrillation today rate controlled.  I discussed with the patient about repeating a TEE since she has been anticoagulated over 6 months, hopefully the left atrial appendage thrombus has resolved and we can be able to move forward with cardioversions.  Plan to her that she needs to continue anticoagulation without interruptions and to notify me if there has been any interruptions of her anticoagulation prior to her procedure.  She also understands that she has to continue her anticoagulation without interruption minimal 4 weeks post TEE cardioversion.  She expresses  understanding.  Post cardioversion, plan to discuss antiarrhythmic therapy with the patient.  Will continue her metoprolol succinate as well as her Eliquis 5 mg twice daily.  Blood pressure slightly elevated today patient reports that she is in pain given her recent fall.  We will reassess the need for additional antihypertensive medication for target less than 130/80 mmHg.  The patient understands the need to lose weight with diet and exercise. We have discussed specific strategies for this.  She recently had her sleep study which showed evidence of sleep apnea and is pending scheduling for her CPAP study  She has stopped her Lipitor due to significant joint pain, will get a LP(a) and lipid profile I discussed with the patient she will be also referred to our lipid clinic as she is a great candidate for PCSK9 inhibitors.  the patient is in agreement with the above plan. The patient left the office in stable condition.  The patient will follow up in 2 weeks post DC cardioversion.   Medication Adjustments/Labs and Tests Ordered: Current medicines are reviewed at length with the patient today.  Concerns regarding medicines are outlined above.  Orders Placed This Encounter  Procedures  . Basic metabolic panel  . Magnesium  . CBC with Differential/Platelet  . Lipid panel  . Lipoprotein A (LPA)  . EKG 12-Lead   No orders of the defined types were placed in this encounter.   Patient Instructions  Medication Instructions:  Your physician recommends that you continue on your current medications as directed. Please refer to the Current Medication list given to you today.  *If you need a refill on your cardiac medications before your next appointment, please call your pharmacy*   Lab Work: Your physician recommends that you return for lab work: TODAY: Lpa, Lipids BEFORE TEE/DCCV: BMET, Mag, CBC If you have labs (blood work) drawn today and your tests are completely normal, you will  receive your results only by: Marland Kitchen MyChart Message (if you have MyChart) OR . A paper copy in the mail If you have any lab test that is abnormal or we need to change your treatment, we will call you to review the results.   Testing/Procedures: Your physician has requested that you have a TEE/Cardioversion. During a TEE, sound waves are used to create images of your heart. It provides your doctor with information about the size and shape of your heart and how well your heart's chambers and valves are working. In this test, a transducer is attached to the end of  a flexible tube that is guided down you throat and into your esophagus (the tube leading from your mouth to your stomach) to get a more detailed image of your heart. Once the TEE has determined that a blood clot is not present, the cardioversion begins. Electrical Cardioversion uses a jolt of electricity to your heart either through paddles or wired patches attached to your chest. This is a controlled, usually prescheduled, procedure. This procedure is done at the hospital and you are not awake during the procedure. You usually go home the day of the procedure. Please see the instruction sheet given to you today for more information.  You are scheduled for a TEE/Cardioversion/TEE Cardioversion on June 9th.  Please arrive at the [facility] (Main Entrance A) at [facility]: [address] [place], [place] [address] at 11 am. (1 hour prior to procedure unless lab work is needed; if lab work is needed arrive 1.5 hours ahead)  DIET: Nothing to eat or drink after midnight except a sip of water with medications (see medication instructions below)  Medication Instructions:  Continue your anticoagulant: Eliquis You will need to continue your anticoagulant after your procedure until you  are told by your  Provider that it is safe to stop   Labs: YOU HAD YOUR LABS DRAWN ON 04/26/2021  You must have a responsible person to drive you home and  stay in the waiting area during your procedure. Failure to do so could result in cancellation.  Bring your insurance cards.  *Special Note: Every effort is made to have your procedure done on time. Occasionally there are emergencies that occur at the hospital that may cause delays. Please be patient if a delay does occur.     Follow-Up: At [facility], you and your health needs are our priority.  As part of our continuing mission to provide you with exceptional heart care, we have created designated Provider Care Teams.  These Care Teams include your primary Cardiologist (physician) and Advanced Practice Providers (APPs -  Physician Assistants and Nurse Practitioners) who all work together to provide you with the care you need, when you need it.  We recommend signing up for the patient portal called "MyChart".  Sign up information is provided on this After Visit Summary.  MyChart is used to connect with patients for Virtual Visits (Telemedicine).  Patients are able to view lab/test results, encounter notes, upcoming appointments, etc.  Non-urgent messages can be sent to your provider as well.   To learn more about what you can do with MyChart, go to [email or web address].    Your next appointment:   2 week(s) after TEE/Cardioversion  The format for your next appointment:   In Person  Provider:   Berniece Salines, DO   Other Instructions     Adopting a Healthy Lifestyle.  Know what a healthy weight is for you (roughly BMI <25) and aim to maintain this   Aim for 7+ servings of fruits and vegetables daily   65-80+ fluid ounces of water or unsweet tea for healthy kidneys   Limit to max 1 drink of alcohol per day; avoid smoking/tobacco   Limit animal fats in diet for cholesterol and heart health - choose grass fed whenever available   Avoid highly processed foods, and foods high in saturated/trans fats   Aim for low stress - take time to unwind and care for your mental health    Aim for 150 min of moderate intensity exercise weekly for heart health,  and weights twice weekly for bone health   Aim for 7-9 hours of sleep daily   When it comes to diets, agreement about the perfect plan isnt easy to find, even among the experts. Experts at the [employer] developed an idea known as the Healthy Eating Plate. Just imagine a plate divided into logical, healthy portions.   The emphasis is on diet quality:   Load up on vegetables and fruits - one-half of your plate: Aim for color and variety, and remember that potatoes dont count.   Go for whole grains - one-quarter of your plate: Whole wheat, barley, wheat berries, quinoa, oats, brown rice, and foods made with them. If you want pasta, go with whole wheat pasta.   Protein power - one-quarter of your plate: Fish, chicken, beans, and nuts are all healthy, versatile protein sources. Limit red meat.   The diet, however, does go beyond the plate, offering a few other suggestions.   Use healthy plant oils, such as olive, canola, soy, corn, sunflower and peanut. Check the labels, and avoid partially hydrogenated oil, which have unhealthy trans fats.   If youre thirsty, drink water. Coffee and tea are good in moderation, but skip sugary drinks and limit milk and dairy products to one or two daily servings.   The type of carbohydrate in the diet is more important than the amount. Some sources of carbohydrates, such as vegetables, fruits, whole grains, and beans-are healthier than others.   Finally, stay active  Signed, Berniece Salines, DO  04/26/2021 10:16 AM    [place]

## 2021-04-26 NOTE — Progress Notes (Signed)
Cardiology Office Note:    Date:  04/26/2021   ID:  Sherryl Barters, DOB [birth year], MRN [id number]  PCP:  Lillard Anes, MD  Cardiologist:  Berniece Salines, DO  Electrophysiologist:  None   Referring MD: Lillard Anes,*   I am doing okay now  History of Present Illness:    Teresa Blair is a 70 y.o. female with a hx of  atrial fibrillation, recent CVA, hypertension, the patient was here in the hospital and was admitted on September 12, 2020 discharge on September 13, 2020 at which time she was taking care of4acute CVA as well as new onset atrial fibrillation.   I saw the patient in November 2021 at that time we will schedule a TEE/cardioversion at [facility].  She was able to get her TEE which showed evidence of left atrial appendage clot therefore cardioversion was not pursued.  At her last visit on March 19, 2021 she did have some chest pain which I referred the patient for a nuclear stress test.  She did get her nuclear stress test which did not show any evidence of ischemia.  She recently had a mechanical fall while at work thankfully she did not hit her head.  She still is experiencing shortness of breath and fatigue, which I suspect may be related to her atrial fibrillation.  Past Medical History:  Diagnosis Date  . A-fib ([place])   . Anxiety 12/01/2020  . Asthma    as a child  . Atrial fibrillation ([place]) 09/16/2020  . Chronic anticoagulation 12/01/2020  . Closed fracture of right acetabulum ([place]) 09/03/2011  . Enthesopathy of hip region 09/03/2011  . Gestational diabetes   . Headache   . History of CVA (cerebrovascular accident) 09/16/2020  . Hypercholesterolemia   . Hypertension   . Kidney stones   . Mixed hyperlipidemia 09/16/2020  . Morbid obesity ([place]) 09/16/2020  . Obesity   . Osteoarthritis   . 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 Primary hypertension 09/16/2020  . 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 Right hip pain 09/03/2011  . Scleroderma, localized   . 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 Stroke ([place]) 09/12/2020   left side weakness/speech affected  . Wears glasses     Past Surgical History:  Procedure Laterality Date  . 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 06/08/2015   Procedure: CONVERSION TO TOTAL HIP, ANTERIOR TOTAL HIP;  Surgeon: Kathryne Hitch, MD;  Location: [facility];  Service: Orthopedics;  Laterality: Right;  . FRACTURE SURGERY     right hip  . TOTAL HIP ARTHROPLASTY Right 06/08/2015  . TUBAL LIGATION    . WISDOM TOOTH EXTRACTION      Current Medications: Current Meds  Medication Sig  . ELIQUIS 5 MG TABS tablet Take 1 tablet (5 mg total) by mouth 2 (two) times daily.  Marland Kitchen LORazepam (ATIVAN) 0.5 MG tablet Take 1 tablet (0.5 mg total) by mouth 2 (two) times daily as needed for anxiety (try with 1/2 pill first).  . methylPREDNISolone (MEDROL DOSEPAK) 4 MG TBPK tablet Take 4 mg by mouth daily.  . metoprolol succinate (TOPROL-XL) 25 MG 24 hr tablet Take one tablet by mouth daily daily in the morning and 0.5 tablet by mouth daily in the evening.  . traMADol (ULTRAM) 50 MG tablet Take 50 mg by mouth every 6 (six) hours as needed.  . [DISCONTINUED] atorvastatin (LIPITOR) 40 MG tablet Take 1 tablet (40 mg total) by mouth daily.  Allergies:   Other, Sulfa antibiotics, and Sulfamethoxazole   Social History   Socioeconomic History  . Marital status: Single    Spouse name: Not on file  . Number of children: 3  . Years of education: Not on file  . Highest education level: Not on file  Occupational History    Comment: full time  Tobacco Use  . Smoking status: Never Smoker  . Smokeless tobacco: Never Used  Vaping Use  . Vaping Use: Never used  Substance and Sexual Activity  . Alcohol use: No  . Drug use: No  . Sexual activity: Not Currently  Other Topics Concern  . Not on file  Social History Narrative   Lives alone   Right Handed   Drinks no caffeine   Social  Determinants of Health   Financial Resource Strain: Not on file  Food Insecurity: Not on file  Transportation Needs: Not on file  Physical Activity: Not on file  Stress: Not on file  Social Connections: Not on file     Family History: The patient's family history includes Cancer in her father and mother; Hypertension in an other family member.  ROS:   Review of Systems  Constitution: Negative for decreased appetite, fever and weight gain.  HENT: Negative for congestion, ear discharge, hoarse voice and sore throat.   Eyes: Negative for discharge, redness, vision loss in right eye and visual halos.  Cardiovascular: Negative for chest pain, dyspnea on exertion, leg swelling, orthopnea and palpitations.  Respiratory: Negative for cough, hemoptysis, shortness of breath and snoring.   Endocrine: Negative for heat intolerance and polyphagia.  Hematologic/Lymphatic: Negative for bleeding problem. Does not bruise/bleed easily.  Skin: Negative for flushing, nail changes, rash and suspicious lesions.  Musculoskeletal: Negative for arthritis, joint pain, muscle cramps, myalgias, neck pain and stiffness.  Gastrointestinal: Negative for abdominal pain, bowel incontinence, diarrhea and excessive appetite.  Genitourinary: Negative for decreased libido, genital sores and incomplete emptying.  Neurological: Negative for brief paralysis, focal weakness, headaches and loss of balance.  Psychiatric/Behavioral: Negative for altered mental status, depression and suicidal ideas.  Allergic/Immunologic: Negative for HIV exposure and persistent infections.    EKGs/Labs/Other Studies Reviewed:    The following studies were reviewed today:   EKG:  The ekg ordered today demonstrates atrial fibrillation with controlled ventricular rate.  Nuclear stress test 03/12/2021  Nuclear stress EF: 67%.  The left ventricular ejection fraction is hyperdynamic (>65%).  There was no ST segment deviation noted during  stress.  This is a low risk study.  No evidence of ischemia or MI.   Recent Labs: No results found for requested labs within last 8760 hours.  Recent Lipid Panel No results found for: CHOL, TRIG, HDL, CHOLHDL, VLDL, LDLCALC, LDLDIRECT  Physical Exam:    VS:  BP (!) 142/80   Pulse 90   Ht 5\' 5"  (1.651 m)   Wt 254 lb 12.8 oz (115.6 kg)   SpO2 98%   BMI 42.40 kg/m     Wt Readings from Last 3 Encounters:  04/26/21 254 lb 12.8 oz (115.6 kg)  03/16/21 250 lb (113.4 kg)  03/13/21 257 lb (116.6 kg)     GEN: Well nourished, well developed in no acute distress HEENT: Normal NECK: No JVD; No carotid bruits LYMPHATICS: No lymphadenopathy CARDIAC: S1S2 noted,RRR, no murmurs, rubs, gallops RESPIRATORY:  Clear to auscultation without rales, wheezing or rhonchi  ABDOMEN: Soft, non-tender, non-distended, +bowel sounds, no guarding. EXTREMITIES: No edema, No cyanosis, no clubbing MUSCULOSKELETAL:  No  deformity  SKIN: Warm and dry NEUROLOGIC:  Alert and oriented x 3, non-focal PSYCHIATRIC:  Normal affect, good insight  ASSESSMENT:    1. Persistent atrial fibrillation ([place])   2. History of cardioembolic cerebrovascular accident (CVA)   3. Hyperlipidemia, unspecified hyperlipidemia type   4. Obstructive sleep apnea   5. Morbid obesity ([place])   6. Hypertension, unspecified type    PLAN:    She is in atrial fibrillation today rate controlled.  I discussed with the patient about repeating a TEE since she has been anticoagulated over 6 months, hopefully the left atrial appendage thrombus has resolved and we can be able to move forward with cardioversions.  Plan to her that she needs to continue anticoagulation without interruptions and to notify me if there has been any interruptions of her anticoagulation prior to her procedure.  She also understands that she has to continue her anticoagulation without interruption minimal 4 weeks post TEE cardioversion.  She expresses  understanding.  Post cardioversion, plan to discuss antiarrhythmic therapy with the patient.  Will continue her metoprolol succinate as well as her Eliquis 5 mg twice daily.  Blood pressure slightly elevated today patient reports that she is in pain given her recent fall.  We will reassess the need for additional antihypertensive medication for target less than 130/80 mmHg.  The patient understands the need to lose weight with diet and exercise. We have discussed specific strategies for this.  She recently had her sleep study which showed evidence of sleep apnea and is pending scheduling for her CPAP study  She has stopped her Lipitor due to significant joint pain, will get a LP(a) and lipid profile I discussed with the patient she will be also referred to our lipid clinic as she is a great candidate for PCSK9 inhibitors.  the patient is in agreement with the above plan. The patient left the office in stable condition.  The patient will follow up in 2 weeks post DC cardioversion.   Medication Adjustments/Labs and Tests Ordered: Current medicines are reviewed at length with the patient today.  Concerns regarding medicines are outlined above.  Orders Placed This Encounter  Procedures  . Basic metabolic panel  . Magnesium  . CBC with Differential/Platelet  . Lipid panel  . Lipoprotein A (LPA)  . EKG 12-Lead   No orders of the defined types were placed in this encounter.   Patient Instructions  Medication Instructions:  Your physician recommends that you continue on your current medications as directed. Please refer to the Current Medication list given to you today.  *If you need a refill on your cardiac medications before your next appointment, please call your pharmacy*   Lab Work: Your physician recommends that you return for lab work: TODAY: Lpa, Lipids BEFORE TEE/DCCV: BMET, Mag, CBC If you have labs (blood work) drawn today and your tests are completely normal, you will  receive your results only by: Marland Kitchen MyChart Message (if you have MyChart) OR . A paper copy in the mail If you have any lab test that is abnormal or we need to change your treatment, we will call you to review the results.   Testing/Procedures: Your physician has requested that you have a TEE/Cardioversion. During a TEE, sound waves are used to create images of your heart. It provides your doctor with information about the size and shape of your heart and how well your heart's chambers and valves are working. In this test, a transducer is attached to the end of  a flexible tube that is guided down you throat and into your esophagus (the tube leading from your mouth to your stomach) to get a more detailed image of your heart. Once the TEE has determined that a blood clot is not present, the cardioversion begins. Electrical Cardioversion uses a jolt of electricity to your heart either through paddles or wired patches attached to your chest. This is a controlled, usually prescheduled, procedure. This procedure is done at the hospital and you are not awake during the procedure. You usually go home the day of the procedure. Please see the instruction sheet given to you today for more information.  You are scheduled for a TEE/Cardioversion/TEE Cardioversion on June 9th.  Please arrive at the [facility] (Main Entrance A) at [facility]: [address] [place], [place] [address] at 11 am. (1 hour prior to procedure unless lab work is needed; if lab work is needed arrive 1.5 hours ahead)  DIET: Nothing to eat or drink after midnight except a sip of water with medications (see medication instructions below)  Medication Instructions:  Continue your anticoagulant: Eliquis You will need to continue your anticoagulant after your procedure until you  are told by your  Provider that it is safe to stop   Labs: YOU HAD YOUR LABS DRAWN ON 04/26/2021  You must have a responsible person to drive you home and  stay in the waiting area during your procedure. Failure to do so could result in cancellation.  Bring your insurance cards.  *Special Note: Every effort is made to have your procedure done on time. Occasionally there are emergencies that occur at the hospital that may cause delays. Please be patient if a delay does occur.     Follow-Up: At [facility], you and your health needs are our priority.  As part of our continuing mission to provide you with exceptional heart care, we have created designated Provider Care Teams.  These Care Teams include your primary Cardiologist (physician) and Advanced Practice Providers (APPs -  Physician Assistants and Nurse Practitioners) who all work together to provide you with the care you need, when you need it.  We recommend signing up for the patient portal called "MyChart".  Sign up information is provided on this After Visit Summary.  MyChart is used to connect with patients for Virtual Visits (Telemedicine).  Patients are able to view lab/test results, encounter notes, upcoming appointments, etc.  Non-urgent messages can be sent to your provider as well.   To learn more about what you can do with MyChart, go to [email or web address].    Your next appointment:   2 week(s) after TEE/Cardioversion  The format for your next appointment:   In Person  Provider:   Berniece Salines, DO   Other Instructions     Adopting a Healthy Lifestyle.  Know what a healthy weight is for you (roughly BMI <25) and aim to maintain this   Aim for 7+ servings of fruits and vegetables daily   65-80+ fluid ounces of water or unsweet tea for healthy kidneys   Limit to max 1 drink of alcohol per day; avoid smoking/tobacco   Limit animal fats in diet for cholesterol and heart health - choose grass fed whenever available   Avoid highly processed foods, and foods high in saturated/trans fats   Aim for low stress - take time to unwind and care for your mental health    Aim for 150 min of moderate intensity exercise weekly for heart health,  and weights twice weekly for bone health   Aim for 7-9 hours of sleep daily   When it comes to diets, agreement about the perfect plan isnt easy to find, even among the experts. Experts at the [employer] developed an idea known as the Healthy Eating Plate. Just imagine a plate divided into logical, healthy portions.   The emphasis is on diet quality:   Load up on vegetables and fruits - one-half of your plate: Aim for color and variety, and remember that potatoes dont count.   Go for whole grains - one-quarter of your plate: Whole wheat, barley, wheat berries, quinoa, oats, brown rice, and foods made with them. If you want pasta, go with whole wheat pasta.   Protein power - one-quarter of your plate: Fish, chicken, beans, and nuts are all healthy, versatile protein sources. Limit red meat.   The diet, however, does go beyond the plate, offering a few other suggestions.   Use healthy plant oils, such as olive, canola, soy, corn, sunflower and peanut. Check the labels, and avoid partially hydrogenated oil, which have unhealthy trans fats.   If youre thirsty, drink water. Coffee and tea are good in moderation, but skip sugary drinks and limit milk and dairy products to one or two daily servings.   The type of carbohydrate in the diet is more important than the amount. Some sources of carbohydrates, such as vegetables, fruits, whole grains, and beans-are healthier than others.   Finally, stay active  Signed, Berniece Salines, DO  04/26/2021 10:16 AM    [place]

## 2021-04-27 LAB — CBC WITH DIFFERENTIAL/PLATELET
Basophils Absolute: 0 10*3/uL (ref 0.0–0.2)
Basos: 0 %
EOS (ABSOLUTE): 0 10*3/uL (ref 0.0–0.4)
Eos: 0 %
Hematocrit: 44.9 % (ref 34.0–46.6)
Hemoglobin: 14.9 g/dL (ref 11.1–15.9)
Immature Grans (Abs): 0 10*3/uL (ref 0.0–0.1)
Immature Granulocytes: 0 %
Lymphocytes Absolute: 0.7 10*3/uL (ref 0.7–3.1)
Lymphs: 6 %
MCH: 28.4 pg (ref 26.6–33.0)
MCHC: 33.2 g/dL (ref 31.5–35.7)
MCV: 86 fL (ref 79–97)
Monocytes Absolute: 0.3 10*3/uL (ref 0.1–0.9)
Monocytes: 3 %
Neutrophils Absolute: 10.2 10*3/uL — ABNORMAL HIGH (ref 1.4–7.0)
Neutrophils: 91 %
Platelets: 266 10*3/uL (ref 150–450)
RBC: 5.25 x10E6/uL (ref 3.77–5.28)
RDW: 11.8 % (ref 11.7–15.4)
WBC: 11.3 10*3/uL — ABNORMAL HIGH (ref 3.4–10.8)

## 2021-04-27 LAB — BASIC METABOLIC PANEL
BUN/Creatinine Ratio: 16 (ref 12–28)
BUN: 11 mg/dL (ref 8–27)
CO2: 21 mmol/L (ref 20–29)
Calcium: 9.8 mg/dL (ref 8.7–10.3)
Chloride: 102 mmol/L (ref 96–106)
Creatinine, Ser: 0.68 mg/dL (ref 0.57–1.00)
Glucose: 121 mg/dL — ABNORMAL HIGH (ref 65–99)
Potassium: 4.9 mmol/L (ref 3.5–5.2)
Sodium: 140 mmol/L (ref 134–144)
eGFR: 95 mL/min/{1.73_m2} (ref >59)

## 2021-04-27 LAB — LIPID PANEL
Chol/HDL Ratio: 3.2 ratio (ref 0.0–4.4)
Cholesterol, Total: 196 mg/dL (ref 100–199)
HDL: 62 mg/dL (ref >39)
LDL Chol Calc (NIH): 122 mg/dL — ABNORMAL HIGH (ref 0–99)
Triglycerides: 68 mg/dL (ref 0–149)
VLDL Cholesterol Cal: 12 mg/dL (ref 5–40)

## 2021-04-27 LAB — LIPOPROTEIN A (LPA): Lipoprotein (a): <8.4 nmol/L (ref <75.0)

## 2021-04-27 LAB — MAGNESIUM: Magnesium: 2.1 mg/dL (ref 1.6–2.3)

## 2021-05-02 NOTE — Addendum Note (Signed)
Addended by: Truddie Hidden on: 05/02/2021 03:50 PM   Modules accepted: Orders

## 2021-05-04 ENCOUNTER — Ambulatory Visit (HOSPITAL_COMMUNITY): Payer: PPO | Admitting: Anesthesiology

## 2021-05-04 ENCOUNTER — Ambulatory Visit (HOSPITAL_BASED_OUTPATIENT_CLINIC_OR_DEPARTMENT_OTHER)
Admission: RE | Admit: 2021-05-04 | Discharge: 2021-05-04 | Disposition: A | Discharge status: Home / Self Care | Payer: PPO | Source: Ambulatory Visit | Attending: Internal Medicine | Admitting: Internal Medicine

## 2021-05-04 ENCOUNTER — Encounter (HOSPITAL_COMMUNITY)
Admission: RE | Disposition: A | Discharge status: Home / Self Care | Payer: Self-pay | Source: Ambulatory Visit | Attending: Internal Medicine

## 2021-05-04 ENCOUNTER — Ambulatory Visit (HOSPITAL_COMMUNITY)
Admission: RE | Admit: 2021-05-04 | Discharge: 2021-05-04 | Disposition: A | Discharge status: Home / Self Care | Payer: PPO | Source: Ambulatory Visit | Attending: Internal Medicine | Admitting: Internal Medicine

## 2021-05-04 ENCOUNTER — Other Ambulatory Visit: Payer: Self-pay

## 2021-05-04 ENCOUNTER — Encounter (HOSPITAL_COMMUNITY): Payer: Self-pay | Admitting: Internal Medicine

## 2021-05-04 DIAGNOSIS — E782 Mixed hyperlipidemia: Secondary | ICD-10-CM | POA: Diagnosis not present

## 2021-05-04 DIAGNOSIS — Z882 Allergy status to sulfonamides status: Secondary | ICD-10-CM | POA: Diagnosis not present

## 2021-05-04 DIAGNOSIS — Z79899 Other long term (current) drug therapy: Secondary | ICD-10-CM | POA: Diagnosis not present

## 2021-05-04 DIAGNOSIS — Z96641 Presence of right artificial hip joint: Secondary | ICD-10-CM | POA: Diagnosis not present

## 2021-05-04 DIAGNOSIS — I4819 Other persistent atrial fibrillation: Secondary | ICD-10-CM

## 2021-05-04 DIAGNOSIS — Z7901 Long term (current) use of anticoagulants: Secondary | ICD-10-CM | POA: Insufficient documentation

## 2021-05-04 DIAGNOSIS — Z6841 Body Mass Index (BMI) 40.0 and over, adult: Secondary | ICD-10-CM | POA: Insufficient documentation

## 2021-05-04 DIAGNOSIS — G4733 Obstructive sleep apnea (adult) (pediatric): Secondary | ICD-10-CM | POA: Diagnosis not present

## 2021-05-04 DIAGNOSIS — I10 Essential (primary) hypertension: Secondary | ICD-10-CM | POA: Diagnosis not present

## 2021-05-04 DIAGNOSIS — Z8241 Family history of sudden cardiac death: Secondary | ICD-10-CM | POA: Insufficient documentation

## 2021-05-04 DIAGNOSIS — Z8673 Personal history of transient ischemic attack (TIA), and cerebral infarction without residual deficits: Secondary | ICD-10-CM | POA: Diagnosis not present

## 2021-05-04 DIAGNOSIS — E7800 Pure hypercholesterolemia, unspecified: Secondary | ICD-10-CM | POA: Insufficient documentation

## 2021-05-04 DIAGNOSIS — Q211 Atrial septal defect: Secondary | ICD-10-CM | POA: Insufficient documentation

## 2021-05-04 DIAGNOSIS — I4891 Unspecified atrial fibrillation: Secondary | ICD-10-CM

## 2021-05-04 DIAGNOSIS — Z8249 Family history of ischemic heart disease and other diseases of the circulatory system: Secondary | ICD-10-CM | POA: Diagnosis not present

## 2021-05-04 HISTORY — PX: TEE WITHOUT CARDIOVERSION: SHX5443

## 2021-05-04 HISTORY — PX: CARDIOVERSION: SHX1299

## 2021-05-04 HISTORY — PX: BUBBLE STUDY: SHX6837

## 2021-05-04 SURGERY — ECHOCARDIOGRAM, TRANSESOPHAGEAL
Anesthesia: General

## 2021-05-04 MED ORDER — PROPOFOL 10 MG/ML IV BOLUS
INTRAVENOUS | Status: DC | PRN
  Administered 2021-05-04: 40 mg via INTRAVENOUS
  Administered 2021-05-04: 20 mg via INTRAVENOUS
  Administered 2021-05-04: 40 mg via INTRAVENOUS

## 2021-05-04 MED ORDER — SODIUM CHLORIDE 0.9 % IV SOLN: INTRAVENOUS | Status: DC

## 2021-05-04 MED ORDER — PROPOFOL 500 MG/50ML IV EMUL
INTRAVENOUS | Status: DC | PRN
  Administered 2021-05-04: 100 ug/kg/min via INTRAVENOUS

## 2021-05-04 MED ORDER — LIDOCAINE HCL (PF) 2 % IJ SOLN
INTRAMUSCULAR | Status: DC | PRN
  Administered 2021-05-04: 100 mg via INTRADERMAL

## 2021-05-04 NOTE — Anesthesia Preprocedure Evaluation (Addendum)
Anesthesia Evaluation  Patient identified by MRN, date of birth, ID band Patient awake    Reviewed: Allergy & Precautions, NPO status , Patient's Chart, lab work & pertinent test results, reviewed documented beta blocker date and time   Airway Mallampati: II  TM Distance: >3 FB Neck ROM: Full    Dental  (+) Teeth Intact, Dental Advisory Given   Pulmonary asthma , sleep apnea ,    breath sounds clear to auscultation       Cardiovascular hypertension, Pt. on medications and Pt. on home beta blockers + dysrhythmias (eliquis) Atrial Fibrillation  Rhythm:Regular Rate:Normal     Neuro/Psych  Headaches, PSYCHIATRIC DISORDERS Anxiety CVA (L sided weakness, speech affected), Residual Symptoms    GI/Hepatic negative GI ROS, Neg liver ROS,   Endo/Other  diabetesMorbid obesityBMI 42  Renal/GU negative Renal ROS  negative genitourinary   Musculoskeletal  (+) Arthritis , Osteoarthritis,    Abdominal (+) + obese,   Peds  Hematology negative hematology ROS (+)   Anesthesia Other Findings   Reproductive/Obstetrics negative OB ROS                            Anesthesia Physical Anesthesia Plan  ASA: 3  Anesthesia Plan: General   Post-op Pain Management:    Induction: Intravenous  PONV Risk Score and Plan: TIVA and Treatment may vary due to age or medical condition  Airway Management Planned: Natural Airway and Mask  Additional Equipment: None  Intra-op Plan:   Post-operative Plan:   Informed Consent: I have reviewed the patients History and Physical, chart, labs and discussed the procedure including the risks, benefits and alternatives for the proposed anesthesia with the patient or authorized representative who has indicated his/her understanding and acceptance.     Dental advisory given  Plan Discussed with: CRNA and Anesthesiologist  Anesthesia Plan Comments:        Anesthesia Quick  Evaluation

## 2021-05-04 NOTE — Transfer of Care (Signed)
Immediate Anesthesia Transfer of Care Note  Patient: Teresa Blair  Procedure(s) Performed: TRANSESOPHAGEAL ECHOCARDIOGRAM (TEE) CARDIOVERSION BUBBLE STUDY  Patient Location: Endoscopy Unit  Anesthesia Type:MAC  Level of Consciousness: awake, alert  and oriented  Airway & Oxygen Therapy: Patient Spontanous Breathing and Patient connected to nasal cannula oxygen  Post-op Assessment: Report given to RN and Post -op Vital signs reviewed and stable  Post vital signs: Reviewed and stable  Last Vitals:  Vitals Value Taken Time  BP 114/72   Temp    Pulse 79   Resp 14   SpO2 98%     Last Pain:  Vitals:   05/04/21 1040  TempSrc: Oral  PainSc: 3          Complications: No notable events documented.

## 2021-05-04 NOTE — Interval H&P Note (Signed)
History and Physical Interval Note:  05/04/2021 10:43 AM  Teresa Blair  has presented today for surgery, with the diagnosis of AFIB.  The various methods of treatment have been discussed with the patient and family. After consideration of risks, benefits and other options for treatment, the patient has consented to  Procedure(s): TRANSESOPHAGEAL ECHOCARDIOGRAM (TEE) (N/A) CARDIOVERSION (N/A) as a surgical intervention.  The patient's history has been reviewed, patient examined, no change in status, stable for surgery.  I have reviewed the patient's chart and labs.  Questions were answered to the patient's satisfaction.     Elouise Munroe

## 2021-05-04 NOTE — Discharge Instructions (Signed)

## 2021-05-04 NOTE — CV Procedure (Signed)
INDICATIONS: Afib  PROCEDURE:   Informed consent was obtained prior to the procedure. The risks, benefits and alternatives for the procedure were discussed and the patient comprehended these risks.  Risks include, but are not limited to, cough, sore throat, vomiting, nausea, somnolence, esophageal and stomach trauma or perforation, bleeding, low blood pressure, aspiration, pneumonia, infection, trauma to the teeth and death.    After a procedural time-out, the oropharynx was anesthetized with 20% benzocaine spray.   During this procedure the patient was administered propofol per anesthesia.  The patient's heart rate, blood pressure, and oxygen saturation were monitored continuously during the procedure. The period of conscious sedation was 30 minutes, of which I was present face-to-face 100% of this time.  The transesophageal probe was inserted in the esophagus and stomach without difficulty and multiple views were obtained.  The patient was kept under observation until the patient left the procedure room.  The patient left the procedure room in stable condition.   Agitated microbubble saline contrast was administered.  COMPLICATIONS:    There were no immediate complications.  FINDINGS:   FORMAL ECHOCARDIOGRAM REPORT PENDING Normal biventricular size and function. No significant valvular heart disease. No LA appendage thrombus, chicken wing morphology. 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  RECOMMENDATIONS:    Proceed to cardioversion  Procedure: Electrical Cardioversion Indications:  Atrial Fibrillation  Procedure Details:  Consent: Risks of procedure as well as the alternatives and risks of each were explained to the (patient/caregiver).  Consent for procedure obtained.  Time Out: Verified patient identification, verified procedure, site/side was marked, verified correct patient position,  special equipment/implants available, medications/allergies/relevent history reviewed, required imaging and test results available. PERFORMED.  Patient placed on cardiac monitor, pulse oximetry, supplemental oxygen as necessary.  Sedation given:  propofol per anesthesia Pacer pads placed anterior and posterior chest.  Cardioverted 3 time(s).  Cardioversion with synchronized biphasic 120J, 200J, 200J shock.  Evaluation: Findings: Post procedure EKG shows: Atrial Fibrillation Complications: None Patient did tolerate procedure well.  Time Spent Directly with the Patient:  60 minutes   Teresa Blair 05/04/2021, 12:05 PM

## 2021-05-04 NOTE — Progress Notes (Signed)
  Echocardiogram 2D Echocardiogram has been performed.  Merrie Roof F 05/04/2021, 12:10 PM

## 2021-05-05 ENCOUNTER — Encounter (HOSPITAL_COMMUNITY): Payer: Self-pay | Admitting: Internal Medicine

## 2021-05-05 NOTE — Anesthesia Postprocedure Evaluation (Signed)
Anesthesia Post Note  Patient: Teresa Blair  Procedure(s) Performed: TRANSESOPHAGEAL ECHOCARDIOGRAM (TEE) CARDIOVERSION BUBBLE STUDY     Patient location during evaluation: Endoscopy Anesthesia Type: General Level of consciousness: awake and alert Pain management: pain level controlled Vital Signs Assessment: post-procedure vital signs reviewed and stable Respiratory status: spontaneous breathing, nonlabored ventilation, respiratory function stable and patient connected to nasal cannula oxygen Cardiovascular status: stable and blood pressure returned to baseline Postop Assessment: no apparent nausea or vomiting Anesthetic complications: no   No notable events documented.  Last Vitals:  Vitals:   05/04/21 1220 05/04/21 1229  BP: 126/62 116/60  Pulse: 80 78  Resp: 19 16  Temp:    SpO2: 94% 95%    Last Pain:  Vitals:   05/04/21 1229  TempSrc:   PainSc: 0-No pain                 Veena Sturgess COKER

## 2021-05-09 ENCOUNTER — Telehealth: Payer: Self-pay

## 2021-05-09 NOTE — Telephone Encounter (Signed)
Spoke with patient about setting up a sleep study appointment. Patient states she really just needs the settings adjusted on her CPAP machine. I called the sleep disorder center to find out more information but no-one answered. Left a message for them to return my call. Patient also states she had her cardioversion last Thursday, she was shocked 3 times and it did not work. She was told she would receive a call from this office in regards to the next steps. Patient is unsure what to do now. Will relay information to Dr. Harriet Masson. Patient verbalizes understanding. No further questions or concerns at this time.

## 2021-05-10 ENCOUNTER — Telehealth: Payer: Self-pay

## 2021-05-10 NOTE — Telephone Encounter (Signed)
Called patient to discuss the next steps she needs to take in regards to her CPAP. Left a message for her to return the call.

## 2021-05-10 NOTE — Telephone Encounter (Signed)
Called to let patient know someone reaching out to her about setting up an appointment for changing her CPAP settings. No answer, left a message.

## 2021-05-12 ENCOUNTER — Encounter: Payer: Self-pay | Admitting: Pharmacist Clinician (PhC)/ Clinical Pharmacy Specialist

## 2021-05-12 ENCOUNTER — Other Ambulatory Visit: Payer: Self-pay

## 2021-05-12 ENCOUNTER — Ambulatory Visit (INDEPENDENT_AMBULATORY_CARE_PROVIDER_SITE_OTHER): Payer: PPO | Admitting: Pharmacist Clinician (PhC)/ Clinical Pharmacy Specialist

## 2021-05-12 DIAGNOSIS — E7800 Pure hypercholesterolemia, unspecified: Secondary | ICD-10-CM

## 2021-05-12 NOTE — Patient Instructions (Signed)
Your Results:             Your most recent labs Goal  Total Cholesterol 196 < 200  Triglycerides 68 < 150  HDL (happy/good cholesterol) 62 > 40  LDL (lousy/bad cholesterol 122 < 70      Medication changes:  We will start paperwork to get Repatha SureClick approved by your insurance.  [facility] will call you once approved.    Lab orders:  You will need to repeat labs after 4-5 doses.  [facility] will arrange this for you.    Patient Assistance:  The [employer] foundation offers assistance to help pay for medication copays.  They will cover copays for all cholesterol lowering meds, including statins, fibrates, omega-3 oils, ezetimibe, Repatha, Praluent, Nexletol, Nexlizet.  The cards are usually good for $2,500 or 12 months, whichever comes first. Go to healthwellfoundation.org Click on "Apply Now" Answer questions as to whom is applying (patient or representative) Your disease fund will be "hypercholesterolemia - Medicare access" They will ask questions about finances and which medications you are taking for cholesterol When you submit, the approval is usually within minutes.  You will need to print the card information from the site You will need to show this information to your pharmacy, they will bill your Medicare Part D plan first -then bill [employer] Well --for the copay.   You can also call them at [phone], although the hold times can be quite long.   Thank you for choosing [facility]

## 2021-05-12 NOTE — Assessment & Plan Note (Signed)
Patient with history of stroke and LDL cholesterol not at goal.  Reviewed options for lowering LDL cholesterol, including ezetimibe, PCSK-9 inhibitors, bempedoic acid and inclisiran.  Discussed mechanisms of action, dosing, side effects and potential decreases in LDL cholesterol.  Answered all patient questions.  Based on this information, patient would prefer to start [place].  Patient aware to have labs drawn after 4-5 doses and call the office with any questions.  She was also given information on [place].  She believes she can pay copay for now, but will be unable once she hits the coverage gap.

## 2021-05-12 NOTE — Progress Notes (Signed)
05/12/2021 Teresa Blair 1951/07/28 [id number]   HPI:  Teresa Blair is a 70 y.o. female patient of Dr Harriet Masson, who presents today for a lipid clinic evaluation.  See pertinent past medical history below.  She saw Dr. Harriet Masson last on June 1, at which time her BP was slightly elevated (142/80). She noted that she had recently had a fall, and was still having pain left over from the incident.  They discussed future repeat of TEE in hopes of moving forward with cardioversion.  Also it was noted that patient had discontinued atorvastatin due to significant joint pain.  Chart notes that she had previously tried pravastatin, with similar results.    She is in the office today to discuss more current cholesterol lowering options.    Past Medical History: CVA 10/21 CVA with subsequent TIA 3 days later  Atrial fibrillation CHADS2-VASc  5 (htn, age, stroke (2), female) on Eliquis 5 mg bid  hypertension Controlled with metoprolol succ  LAA clot Found 11/21 unable to cardiovert at that time  Gestational diabetes No A1c on file, random glucose 98-131    Current Medications: none  Cholesterol Goals: LDL < 70   Intolerant/previously tried: atorvastatin 40, pravastatin 10 and 40  Family history: no family history of heart disease, parents both died from cancers.  Siblings and children also healthy.    Diet: admits that she needs to work on diet.  Lives alone so does eat out more than she should.  Is currently growing garden, so will be adding fresh vegetables to her diet soon.  Will work on better choices for snacks and meals when eating out.   [place], including vegetable garden  Labs: 6/21:  TC 196, TG 68, HDL 62, LDL 122, Lp(a) 8.5   Current Outpatient Medications  Medication Sig Dispense Refill   ELIQUIS 5 MG TABS tablet Take 1 tablet (5 mg total) by mouth 2 (two) times daily. 60 tablet 12   LORazepam (ATIVAN) 0.5 MG tablet Take 1 tablet (0.5 mg  total) by mouth 2 (two) times daily as needed for anxiety (try with 1/2 pill first). (Patient taking differently: Take 0.25-0.5 mg by mouth 2 (two) times daily as needed for anxiety.) 30 tablet 3   methylPREDNISolone (MEDROL DOSEPAK) 4 MG TBPK tablet Take 4-24 mg by mouth See admin instructions. Take these number of tablets on consecutive days:6-5-4-3-2-1     metoprolol succinate (TOPROL-XL) 25 MG 24 hr tablet Take one tablet by mouth daily daily in the morning and 0.5 tablet by mouth daily in the evening. (Patient taking differently: Take 12.5-25 mg by mouth See admin instructions. Take one tablet by mouth daily daily in the morning and 0.5 tablet by mouth daily in the evening.) 135 tablet 3   traMADol (ULTRAM) 50 MG tablet Take 25 mg by mouth at bedtime.     No current facility-administered medications for this visit.    Allergies  Allergen Reactions   Perflutren Shortness Of Breath    Protein contrast (pain radiating from feet through spine)   Atorvastatin Other (See Comments)    Joint pain, aches   Other Hives and Swelling    Poppy seeds   Pravastatin Other (See Comments)    Joint pain, aches   Sulfa Antibiotics Hives   Sulfamethoxazole Rash    Past Medical History:  Diagnosis Date   A-fib ([place])    Anxiety 12/01/2020   Asthma    as  a child   Atrial fibrillation ([place]) 09/16/2020   Chronic anticoagulation 12/01/2020   Closed fracture of right acetabulum ([place]) 09/03/2011   Enthesopathy of hip region 09/03/2011   Gestational diabetes    Headache    History of CVA (cerebrovascular accident) 09/16/2020   Hypercholesterolemia    Hypertension    Kidney stones    Mixed hyperlipidemia 09/16/2020   Morbid obesity ([place]) 09/16/2020   Obesity    Osteoarthritis    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   Hypercholesterolemia Patient with history of stroke and LDL cholesterol not at goal.  Reviewed options for lowering LDL cholesterol, including ezetimibe, PCSK-9 inhibitors, bempedoic acid and inclisiran.  Discussed mechanisms of action, dosing, side effects and potential decreases in LDL cholesterol.  Answered all patient questions.  Based on this information, patient would prefer to start [place].  Patient aware to have labs drawn after 4-5 doses and call the office with any questions.  She was also given information on [place].  She believes she can pay copay for now, but will be unable once she hits the coverage gap.     Tommy Medal PharmD CPP [facility] [address] [place], [place] [address] [phone]

## 2021-05-17 ENCOUNTER — Telehealth: Payer: Self-pay

## 2021-05-17 DIAGNOSIS — E782 Mixed hyperlipidemia: Secondary | ICD-10-CM

## 2021-05-17 MED ORDER — REPATHA SURECLICK 140 MG/ML ~~LOC~~ SOAJ: 140.0000 mg | SUBCUTANEOUS | 11 refills | Status: DC

## 2021-05-17 NOTE — Telephone Encounter (Signed)
Called and lmom pt stating to Start repatha 140mg  bi weekly, rx snet, pt instructed to complete fasting labs post 4th dose and to call if unaffordable

## 2021-05-18 ENCOUNTER — Ambulatory Visit (INDEPENDENT_AMBULATORY_CARE_PROVIDER_SITE_OTHER): Payer: PPO | Admitting: Legal Medicine

## 2021-05-18 ENCOUNTER — Encounter: Payer: Self-pay | Admitting: Legal Medicine

## 2021-05-18 ENCOUNTER — Other Ambulatory Visit: Payer: Self-pay

## 2021-05-18 VITALS — BP 112/80 | HR 92 | Temp 97.6°F | Resp 16 | Ht 65.0 in | Wt 243.0 lb

## 2021-05-18 DIAGNOSIS — L97911 Non-pressure chronic ulcer of unspecified part of right lower leg limited to breakdown of skin: Secondary | ICD-10-CM | POA: Diagnosis not present

## 2021-05-18 MED ORDER — CEPHALEXIN 500 MG PO CAPS
500.0000 mg | ORAL_CAPSULE | Freq: Four times a day (QID) | ORAL | 0 refills | Status: DC
Start: 2021-05-18 — End: 2021-05-25

## 2021-05-18 NOTE — Patient Instructions (Signed)
Wound Care, Adult Taking care of your wound properly can help to prevent pain, infection, and scarring. It can also help your wound heal more quickly. Follow instructionsfrom your health care provider about how to care for your wound. Supplies needed: Soap and water. Wound cleanser. Gauze. If needed, a clean bandage (dressing) or other type of wound dressing material to cover or place in the wound. Follow your health care provider's instructions about what dressing supplies to use. Cream or ointment to apply to the wound, if told by your health care provider. How to care for your wound Cleaning the wound Ask your health care provider how to clean the wound. This may include: Using mild soap and water or a wound cleanser. Using a clean gauze to pat the wound dry after cleaning it. Do not rub or scrub the wound. Dressing care Wash your hands with soap and water for at least 20 seconds before and after you change the dressing. If soap and water are not available, use hand sanitizer. Change your dressing as told by your health care provider. This may include: Cleaning or rinsing out (irrigating) the wound. Placing a dressing over the wound or in the wound (packing). Covering the wound with an outer dressing. Leave any stitches (sutures), skin glue, or adhesive strips in place. These skin closures may need to stay in place for 2 weeks or longer. If adhesive strip edges start to loosen and curl up, you may trim the loose edges. Do not remove adhesive strips completely unless your health care provider tells you to do that. Ask your health care provider when you can leave the wound uncovered. Checking for infection Check your wound area every day for signs of infection. Check for: More redness, swelling, or pain. Fluid or blood. Warmth. Pus or a bad smell.  Follow these instructions at home Medicines If you were prescribed an antibiotic medicine, cream, or ointment, take or apply it as told by  your health care provider. Do not stop using the antibiotic even if your condition improves. If you were prescribed pain medicine, take it 30 minutes before you do any wound care or as told by your health care provider. Take over-the-counter and prescription medicines only as told by your health care provider. Eating and drinking Eat a diet that includes protein, vitamin A, vitamin C, and other nutrient-rich foods to help the wound heal. Foods rich in protein include meat, fish, eggs, dairy, beans, and nuts. Foods rich in vitamin A include carrots and dark green, leafy vegetables. Foods rich in vitamin C include citrus fruits, tomatoes, broccoli, and peppers. Drink enough fluid to keep your urine pale yellow. General instructions Do not take baths, swim, use a hot tub, or do anything that would put the wound underwater until your health care provider approves. Ask your health care provider if you may take showers. You may only be allowed to take sponge baths. Do not scratch or pick at the wound. Keep it covered as told by your health care provider. Return to your normal activities as told by your health care provider. Ask your health care provider what activities are safe for you. Protect your wound from the sun when you are outside for the first 6 months, or for as long as told by your health care provider. Cover up the scar area or apply sunscreen that has an SPF of at least 30. Do not use any products that contain nicotine or tobacco, such as cigarettes, e-cigarettes, and chewing tobacco.   These may delay wound healing. If you need help quitting, ask your health care provider. Keep all follow-up visits as told by your health care provider. This is important. Contact a health care provider if: You received a tetanus shot and you have swelling, severe pain, redness, or bleeding at the injection site. Your pain is not controlled with medicine. You have any of these signs of infection: More  redness, swelling, or pain around the wound. Fluid or blood coming from the wound. Warmth coming from the wound. Pus or a bad smell coming from the wound. A fever or chills. You are nauseous or you vomit. You are dizzy. Get help right away if: You have a red streak of skin near the area around your wound. Your wound has been closed with staples, sutures, skin glue, or adhesive strips and it begins to open up and separate. Your wound is bleeding, and the bleeding does not stop with gentle pressure. You have a rash. You faint. You have trouble breathing. These symptoms may represent a serious problem that is an emergency. Do not wait to see if the symptoms will go away. Get medical help right away. Call your local emergency services (911 in the [place] Do not drive yourself to the hospital. Summary Always wash your hands with soap and water for at least 20 seconds before and after changing your dressing. Change your dressing as told by your health care provider. To help with healing, eat foods that are rich in protein, vitamin A, vitamin C, and other nutrients. Check your wound every day for signs of infection. Contact your health care provider if you suspect that your wound is infected. This information is not intended to replace advice given to you by your health care provider. Make sure you discuss any questions you have with your healthcare provider. Document Revised: 08/28/2019 Document Reviewed: 08/28/2019 Elsevier Patient Education  2022 Elsevier Inc.  

## 2021-05-18 NOTE — Progress Notes (Signed)
Acute Office Visit  Subjective:    Patient ID: Teresa Blair, female    DOB: 1950/12/21, 70 y.o.   MRN: [id number]  Chief Complaint  Patient presents with   Laceration   Fall    Patient fell down on 04/18/2021. Patient went to Urgent care [facility] [place].    HPI Patient is in today for fall with ulcer on right lower leg and abrasions to left knee that is healing.  The ulcer 1cm with 3 cm of erythema it is not healing since 04/18/2021.  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   Primary hypertension 09/16/2020   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   Right hip pain 09/03/2011   Scleroderma, localized    Sequelae, 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   Stroke ([place]) 09/12/2020   left side weakness/speech affected   Wears glasses     Past Surgical History:  Procedure Laterality Date   BUBBLE STUDY  05/04/2021   Procedure: BUBBLE STUDY;  Surgeon: Elouise Munroe, MD;  Location: [place];  Service: Cardiovascular;;   CARDIOVERSION N/A 05/04/2021   Procedure: CARDIOVERSION;  Surgeon: Elouise Munroe, MD;  Location: [facility] ENDOSCOPY;  Service: Cardiovascular;  Laterality: N/A;   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 06/08/2015   Procedure: CONVERSION TO TOTAL HIP, ANTERIOR TOTAL HIP;  Surgeon: Kathryne Hitch, MD;  Location: [place];  Service: Orthopedics;  Laterality: Right;   FRACTURE SURGERY     right hip   TEE WITHOUT CARDIOVERSION N/A 05/04/2021    Procedure: TRANSESOPHAGEAL ECHOCARDIOGRAM (TEE);  Surgeon: Elouise Munroe, MD;  Location[facility] ENDOSCOPY;  Service: Cardiovascular;  Laterality: N/A;   TOTAL HIP ARTHROPLASTY Right 06/08/2015   TUBAL LIGATION     WISDOM TOOTH EXTRACTION      Family History  Problem Relation Age of Onset   Cancer Mother    Cancer Father    Hypertension Other     Social History   Socioeconomic History   Marital status: Single    Spouse name: Not on file   Number of children: 3   Years of education: Not on file   Highest education level: Not on file  Occupational History    Comment: full time  Tobacco Use   Smoking status: Never   Smokeless tobacco: Never  Vaping Use   Vaping Use: Never used  Substance and Sexual Activity   Alcohol use: No   Drug use: No   Sexual activity: Not Currently  Other Topics Concern   Not on file  Social History Narrative   Lives alone   Right Handed   Drinks no caffeine   Social Determinants of Health   Financial Resource Strain: Not on file  Food Insecurity: Not on file  Transportation Needs: Not on file  Physical Activity: Not on file  Stress: Not on file  Social Connections: Not on file  Intimate Partner Violence: Not on file  Outpatient Medications Prior to Visit  Medication Sig Dispense Refill   ELIQUIS 5 MG TABS tablet Take 1 tablet (5 mg total) by mouth 2 (two) times daily. 60 tablet 12   Evolocumab (REPATHA SURECLICK) 150 MG/ML SOAJ Inject 140 mg into the skin every 14 (fourteen) days. 2 mL 11   LORazepam (ATIVAN) 0.5 MG tablet Take 1 tablet (0.5 mg total) by mouth 2 (two) times daily as needed for anxiety (try with 1/2 pill first). (Patient taking differently: Take 0.25-0.5 mg by mouth 2 (two) times daily as needed for anxiety.) 30 tablet 3   metoprolol succinate (TOPROL-XL) 25 MG 24 hr tablet Take one tablet by mouth daily daily in the morning and 0.5 tablet by mouth daily in the evening. (Patient taking differently: Take 12.5-25 mg by mouth  See admin instructions. Take one tablet by mouth daily daily in the morning and 0.5 tablet by mouth daily in the evening.) 135 tablet 3   methylPREDNISolone (MEDROL DOSEPAK) 4 MG TBPK tablet Take 4-24 mg by mouth See admin instructions. Take these number of tablets on consecutive days:6-5-4-3-2-1     traMADol (ULTRAM) 50 MG tablet Take 25 mg by mouth at bedtime.     No facility-administered medications prior to visit.    Allergies  Allergen Reactions   Perflutren Shortness Of Breath    Protein contrast (pain radiating from feet through spine)   Atorvastatin Other (See Comments)    Joint pain, aches   Other Hives and Swelling    Poppy seeds   Pravastatin Other (See Comments)    Joint pain, aches   Sulfa Antibiotics Hives   Sulfamethoxazole Rash    Review of Systems  Constitutional: Negative.  Negative for activity change and appetite change.  HENT:  Negative for congestion.   Eyes:  Negative for visual disturbance.  Respiratory:  Negative for chest tightness and shortness of breath.   Cardiovascular: Negative.  Negative for chest pain, palpitations and leg swelling.  Gastrointestinal:  Negative for abdominal distention.  Endocrine: Negative for polyuria.  Genitourinary:  Negative for difficulty urinating and dysuria.  Musculoskeletal:  Negative for arthralgias and back pain.  Skin: Negative.   Neurological: Negative.       Objective:    Physical Exam Vitals reviewed.  Constitutional:      Appearance: Normal appearance.  HENT:     Right Ear: Tympanic membrane normal.     Left Ear: Tympanic membrane normal.     Nose: Nose normal.  Cardiovascular:     Rate and Rhythm: Normal rate and regular rhythm.     Pulses: Normal pulses.     Heart sounds: Normal heart sounds.  Pulmonary:     Effort: Pulmonary effort is normal.     Breath sounds: Normal breath sounds.  Musculoskeletal:     Cervical back: Normal range of motion.  Skin:    Capillary Refill: Capillary refill takes  less than 2 seconds.  Neurological:     General: No focal deficit present.     Mental Status: She is alert.    BP 112/80   Pulse 92   Temp 97.6 F (36.4 C)   Resp 16   Ht $R'5\' 5"'Se$  (1.651 m)   Wt 243 lb (110.2 kg)   SpO2 98%   BMI 40.44 kg/m  Wt Readings from Last 3 Encounters:  05/18/21 243 lb (110.2 kg)  05/04/21 249 lb (112.9 kg)  04/26/21 254 lb 12.8 oz (115.6 kg)    Health Maintenance Due  Topic  Date Due   Hepatitis C Screening  Never done   Zoster Vaccines- Shingrix (1 of 2) Never done   COLONOSCOPY (Pts 45-78yrs Insurance coverage will need to be confirmed)  Never done   MAMMOGRAM  Never done   DEXA SCAN  Never done   COVID-19 Vaccine (3 - Pfizer risk series) 08/05/2020    There are no preventive care reminders to display for this patient.   No results found for: TSH Lab Results  Component Value Date   WBC 11.3 (H) 04/26/2021   HGB 14.9 04/26/2021   HCT 44.9 04/26/2021   MCV 86 04/26/2021   PLT 266 04/26/2021   Lab Results  Component Value Date   NA 140 04/26/2021   K 4.9 04/26/2021   CO2 21 04/26/2021   GLUCOSE 121 (H) 04/26/2021   BUN 11 04/26/2021   CREATININE 0.68 04/26/2021   BILITOT 0.5 05/26/2015   ALKPHOS 68 05/26/2015   AST 18 05/26/2015   ALT 20 05/26/2015   PROT 7.2 05/26/2015   ALBUMIN 4.1 05/26/2015   CALCIUM 9.8 04/26/2021   ANIONGAP 4 (L) 06/10/2015   EGFR 95 04/26/2021   Lab Results  Component Value Date   CHOL 196 04/26/2021   Lab Results  Component Value Date   HDL 62 04/26/2021   Lab Results  Component Value Date   LDLCALC 122 (H) 04/26/2021   Lab Results  Component Value Date   TRIG 68 04/26/2021   Lab Results  Component Value Date   CHOLHDL 3.2 04/26/2021   No results found for: HGBA1C     Assessment & Plan:  Diagnoses and all orders for this visit: Ulcer of right lower extremity, limited to breakdown of skin (HCC) -     cephALEXin (KEFLEX) 500 MG capsule; Take 1 capsule (500 mg total) by mouth 4 (four)  times daily.   Use silvadene and dress area, keep covered.     I spent 20 minutes dedicated to the care of this patient on the date of this encounter to include face-to-face time with the patient, as well as  Follow-up: Return in about 1 week (around 05/25/2021) for ulcer.  An After Visit Summary was printed and given to the patient.  Reinaldo Meeker, MD [facility] [phone]

## 2021-05-25 ENCOUNTER — Encounter: Payer: Self-pay | Admitting: Legal Medicine

## 2021-05-25 ENCOUNTER — Other Ambulatory Visit: Payer: Self-pay

## 2021-05-25 ENCOUNTER — Ambulatory Visit (INDEPENDENT_AMBULATORY_CARE_PROVIDER_SITE_OTHER): Payer: PPO | Admitting: Legal Medicine

## 2021-05-25 VITALS — BP 142/70 | HR 80 | Temp 97.9°F | Resp 16 | Ht 65.0 in | Wt 256.0 lb

## 2021-05-25 DIAGNOSIS — L97911 Non-pressure chronic ulcer of unspecified part of right lower leg limited to breakdown of skin: Secondary | ICD-10-CM

## 2021-05-25 NOTE — Progress Notes (Signed)
Established Patient Office Visit  Subjective:  Patient ID: Teresa Blair, female    DOB: February 21, 1951  Age: 70 y.o. MRN: [id number]  CC:  Chief Complaint  Patient presents with   Ulcer Check    Right leg ulcer check. Patient took keflex.    HPI Teresa Blair presents for follow up of ulcer. It is healing well.  0.5 cm and scabbing over.  Less redness and nontender.   Past Medical History:  Diagnosis Date   A-fib [facility])    Anxiety 12/01/2020   Asthma    as a child   Atrial fibrillation ([place]) 09/16/2020   Chronic anticoagulation 12/01/2020   Closed fracture of right acetabulum ([place]) 09/03/2011   Enthesopathy of hip region 09/03/2011   Gestational diabetes    Headache    History of CVA (cerebrovascular accident) 09/16/2020   Hypercholesterolemia    Hypertension    Kidney stones    Mixed hyperlipidemia 09/16/2020   Morbid obesity ([place]) 09/16/2020   Obesity    Osteoarthritis    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   Primary hypertension 09/16/2020   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   Right hip pain 09/03/2011   Scleroderma, localized    Sequelae, 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   Stroke ([place]) 09/12/2020   left side weakness/speech affected   Wears glasses     Past Surgical History:  Procedure Laterality Date   BUBBLE STUDY  05/04/2021   Procedure: BUBBLE STUDY;  Surgeon: Elouise Munroe, MD;  Location: [place];  Service: Cardiovascular;;   CARDIOVERSION N/A 05/04/2021   Procedure: CARDIOVERSION;  Surgeon: Elouise Munroe, MD;  Location: [facility] ENDOSCOPY;  Service: Cardiovascular;  Laterality: N/A;   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 06/08/2015   Procedure: CONVERSION TO TOTAL HIP, ANTERIOR TOTAL HIP;  Surgeon: Kathryne Hitch, MD;  Location: [place];  Service: Orthopedics;  Laterality: Right;   FRACTURE SURGERY     right hip   TEE WITHOUT CARDIOVERSION N/A 05/04/2021   Procedure: TRANSESOPHAGEAL ECHOCARDIOGRAM (TEE);  Surgeon: Elouise Munroe, MD;  Location: [facility] ENDOSCOPY;  Service: Cardiovascular;  Laterality: N/A;   TOTAL HIP ARTHROPLASTY Right 06/08/2015   TUBAL LIGATION     WISDOM TOOTH EXTRACTION      Family History  Problem Relation Age of Onset   Cancer Mother    Cancer Father    Hypertension Other     Social History   Socioeconomic History   Marital status: Single    Spouse name: Not on file   Number of children: 3   Years of education: Not on file   Highest education level: Not on file  Occupational History    Comment: full time  Tobacco Use   Smoking status: Never   Smokeless tobacco: Never  Vaping Use   Vaping Use: Never used  Substance and Sexual Activity   Alcohol use: No   Drug use: No   Sexual activity: Not Currently  Other Topics Concern   Not on file  Social History Narrative   Lives alone   Right Handed   Drinks no caffeine   Social Determinants of Health   Financial Resource Strain: Not on file  Food Insecurity: Not on file  Transportation Needs: Not on file  Physical Activity: Not on file  Stress: Not on file  Social Connections: Not on file  Intimate Partner Violence: Not on file    Outpatient Medications Prior to Visit  Medication Sig Dispense Refill   ELIQUIS 5 MG  TABS tablet Take 1 tablet (5 mg total) by mouth 2 (two) times daily. 60 tablet 12   Evolocumab (REPATHA SURECLICK) 147 MG/ML SOAJ Inject 140 mg into the skin every 14 (fourteen) days. 2 mL 11   LORazepam (ATIVAN) 0.5 MG tablet Take 1 tablet (0.5 mg total) by mouth 2 (two) times daily as needed for anxiety (try with 1/2 pill first). (Patient taking differently: Take 0.25-0.5 mg by mouth 2 (two) times daily as needed for anxiety.) 30 tablet 3   metoprolol succinate (TOPROL-XL) 25 MG 24 hr tablet Take one tablet by mouth daily daily in the morning and 0.5 tablet by mouth daily in the evening. (Patient taking differently: Take 12.5-25 mg by mouth See admin instructions. Take one tablet by mouth daily daily in the  morning and 0.5 tablet by mouth daily in the evening.) 135 tablet 3   cephALEXin (KEFLEX) 500 MG capsule Take 1 capsule (500 mg total) by mouth 4 (four) times daily. 20 capsule 0   No facility-administered medications prior to visit.    Allergies  Allergen Reactions   Perflutren Shortness Of Breath    Protein contrast (pain radiating from feet through spine)   Atorvastatin Other (See Comments)    Joint pain, aches   Other Hives and Swelling    Poppy seeds   Pravastatin Other (See Comments)    Joint pain, aches   Sulfa Antibiotics Hives   Sulfamethoxazole Rash    ROS Review of Systems  Constitutional:  Negative for activity change and appetite change.  HENT:  Negative for congestion.   Eyes:  Negative for visual disturbance.  Respiratory:  Negative for apnea, chest tightness and shortness of breath.   Genitourinary:  Negative for difficulty urinating and dysuria.  Musculoskeletal:  Negative for arthralgias and back pain.  Skin:  Positive for wound.  Neurological: Negative.   Psychiatric/Behavioral: Negative.       Objective:    Physical Exam Vitals reviewed.  Constitutional:      Appearance: Normal appearance.  Cardiovascular:     Rate and Rhythm: Normal rate and regular rhythm.     Pulses: Normal pulses.     Heart sounds: Normal heart sounds. No murmur heard.   No gallop.  Pulmonary:     Effort: Pulmonary effort is normal. No respiratory distress.     Breath sounds: Normal breath sounds. No wheezing.  Musculoskeletal:        General: Normal range of motion.     Cervical back: Normal range of motion and neck supple.  Skin:    General: Skin is warm.     Capillary Refill: Capillary refill takes less than 2 seconds.     Comments: Ulcer healing well, 0.5 cm and scabbing over.  No infection  Neurological:     General: No focal deficit present.     Mental Status: She is alert and oriented to person, place, and time.    BP (!) 142/70   Pulse 80   Temp 97.9 F  (36.6 C)   Resp 16   Ht _0  (1.651 m)   Wt 256 lb (116.1 kg)   SpO2 98%   BMI 42.60 kg/m  Wt Readings from Last 3 Encounters:  05/25/21 256 lb (116.1 kg)  05/18/21 243 lb (110.2 kg)  05/04/21 249 lb (112.9 kg)     Health Maintenance Due  Topic Date Due   Hepatitis C Screening  Never done   Zoster Vaccines- Shingrix (1 of 2) Never done  COLONOSCOPY (Pts 45-2yr Insurance coverage will need to be confirmed)  Never done   MAMMOGRAM  Never done   DEXA SCAN  Never done   COVID-19 Vaccine (3 - Pfizer risk series) 08/05/2020    There are no preventive care reminders to display for this patient.  No results found for: TSH Lab Results  Component Value Date   WBC 11.3 (H) 04/26/2021   HGB 14.9 04/26/2021   HCT 44.9 04/26/2021   MCV 86 04/26/2021   PLT 266 04/26/2021   Lab Results  Component Value Date   NA 140 04/26/2021   K 4.9 04/26/2021   CO2 21 04/26/2021   GLUCOSE 121 (H) 04/26/2021   BUN 11 04/26/2021   CREATININE 0.68 04/26/2021   BILITOT 0.5 05/26/2015   ALKPHOS 68 05/26/2015   AST 18 05/26/2015   ALT 20 05/26/2015   PROT 7.2 05/26/2015   ALBUMIN 4.1 05/26/2015   CALCIUM 9.8 04/26/2021   ANIONGAP 4 (L) 06/10/2015   EGFR 95 04/26/2021   Lab Results  Component Value Date   CHOL 196 04/26/2021   Lab Results  Component Value Date   HDL 62 04/26/2021   Lab Results  Component Value Date   LDLCALC 122 (H) 04/26/2021   Lab Results  Component Value Date   TRIG 68 04/26/2021   Lab Results  Component Value Date   CHOLHDL 3.2 04/26/2021   No results found for: HGBA1C    Assessment & Plan:   Problem List Items Addressed This Visit   None Visit Diagnoses     Ulcer of right lower extremity, [place] to breakdown of skin [place]    -  Primary   The ulcer is healing well and has scabbed over, use bandaid over it.      Follow-up: Return if symptoms worsen or fail to improve.    LReinaldo Meeker MD

## 2021-05-31 ENCOUNTER — Other Ambulatory Visit: Payer: Self-pay

## 2021-05-31 ENCOUNTER — Telehealth: Payer: Self-pay

## 2021-05-31 DIAGNOSIS — G4733 Obstructive sleep apnea (adult) (pediatric): Secondary | ICD-10-CM

## 2021-05-31 DIAGNOSIS — R0683 Snoring: Secondary | ICD-10-CM

## 2021-05-31 NOTE — Progress Notes (Signed)
[facility], after reading over patient's cart we discovered patient needs to go back to have a CPAP Titration preformed. Order placed, will send message for pre-cert.

## 2021-05-31 NOTE — Telephone Encounter (Signed)
After talking to Teresa Blair, I called patient to let her know she has to reach out to the company on her machine to get the the machine calibrated. Patient states she does not have a machine, "that is what I have been waiting on." I will reach out to [facility] sleep center to see what next steps the patient will have to take.

## 2021-06-02 ENCOUNTER — Ambulatory Visit: Payer: PPO | Admitting: Cardiology

## 2021-06-02 ENCOUNTER — Other Ambulatory Visit: Payer: Self-pay

## 2021-06-02 ENCOUNTER — Telehealth: Payer: Self-pay | Admitting: *Deleted

## 2021-06-02 ENCOUNTER — Encounter: Payer: Self-pay | Admitting: Cardiology

## 2021-06-02 VITALS — BP 118/68 | HR 71 | Ht 65.0 in | Wt 255.0 lb

## 2021-06-02 DIAGNOSIS — I10 Essential (primary) hypertension: Secondary | ICD-10-CM

## 2021-06-02 DIAGNOSIS — Z8673 Personal history of transient ischemic attack (TIA), and cerebral infarction without residual deficits: Secondary | ICD-10-CM

## 2021-06-02 DIAGNOSIS — E782 Mixed hyperlipidemia: Secondary | ICD-10-CM

## 2021-06-02 DIAGNOSIS — G4733 Obstructive sleep apnea (adult) (pediatric): Secondary | ICD-10-CM

## 2021-06-02 DIAGNOSIS — Z7901 Long term (current) use of anticoagulants: Secondary | ICD-10-CM | POA: Diagnosis not present

## 2021-06-02 DIAGNOSIS — I4819 Other persistent atrial fibrillation: Secondary | ICD-10-CM | POA: Diagnosis not present

## 2021-06-02 NOTE — Patient Instructions (Addendum)
Medication Instructions:  Your physician recommends that you continue on your current medications as directed. Please refer to the Current Medication list given to you today.  *If you need a refill on your cardiac medications before your next appointment, please call your pharmacy*   Lab Work: None If you have labs (blood work) drawn today and your tests are completely normal, you will receive your results only by: [place] (if you have MyChart) OR A paper copy in the mail If you have any lab test that is abnormal or we need to change your treatment, we will call you to review the results.   Testing/Procedures: None   Follow-Up: At [facility], you and your health needs are our priority.  As part of our continuing mission to provide you with exceptional heart care, we have created designated Provider Care Teams.  These Care Teams include your primary Cardiologist (physician) and Advanced Practice Providers (APPs -  Physician Assistants and Nurse Practitioners) who all work together to provide you with the care you need, when you need it.  We recommend signing up for the patient portal called "MyChart".  Sign up information is provided on this After Visit Summary.  MyChart is used to connect with patients for Virtual Visits (Telemedicine).  Patients are able to view lab/test results, encounter notes, upcoming appointments, etc.  Non-urgent messages can be sent to your provider as well.   To learn more about what you can do with MyChart, go to [email or web address].    Your next appointment:   6 month(s)  The format for your next appointment:   In Person  Provider:   [address] - Berniece Salines, DO    Other Instructions  Please contact Barry Brunner, she left a message on your phone on 04/21/21. She will help you set up your next steps to possibility get a CPAP machine.

## 2021-06-02 NOTE — Progress Notes (Signed)
Cardiology Office Note:    Date:  06/02/2021   ID:  Teresa Blair, DOB [birth year], MRN [id number]  PCP:  Teresa Anes, MD  Cardiologist:  Teresa Salines, DO  Electrophysiologist:  None   Referring MD: Teresa Blair,*   " I am still experiencing shortness of breath"  History of Present Illness:    Teresa Blair is a 70 y.o. female with a hx of atrial fibrillation, recent CVA, hypertension, the patient was here in the hospital and was admitted on September 12, 2020 discharge on September 13, 2020 at which time she was taking care of 4 acute CVA as well as new onset atrial fibrillation.    I saw the patient in November 2021 at that time we will schedule a TEE/cardioversion at [facility].  She was able to get her TEE which showed evidence of left atrial appendage clot therefore cardioversion was not pursued.  I saw the patient on March 19, 2021 she did have some chest pain which I referred the patient for a nuclear stress test.  She did get her nuclear stress test which did not show any evidence of ischemia.  I saw the patient on 04/26/2021 at that time, I send the patient for a TEE/Cardioversion. She did undergo the TEE/CV unfortunately she did not convert to sinus rhythm.     Past Medical History:  Diagnosis Date   A-fib [facility])    Anxiety 12/01/2020   Asthma    as a child   Atrial fibrillation ([place]) 09/16/2020   Chronic anticoagulation 12/01/2020   Closed fracture of right acetabulum ([place]) 09/03/2011   Enthesopathy of hip region 09/03/2011   Gestational diabetes    Headache    History of CVA (cerebrovascular accident) 09/16/2020   Hypercholesterolemia    Hypertension    Kidney stones    Mixed hyperlipidemia 09/16/2020   Morbid obesity ([place]) 09/16/2020   Obesity    Osteoarthritis    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   Primary hypertension 09/16/2020   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   Right hip pain 09/03/2011    Scleroderma, localized    Sequelae, 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   Stroke ([place]) 09/12/2020   left side weakness/speech affected   Wears glasses     Past Surgical History:  Procedure Laterality Date   BUBBLE STUDY  05/04/2021   Procedure: BUBBLE STUDY;  Surgeon: Elouise Munroe, MD;  Location: [place];  Service: Cardiovascular;;   CARDIOVERSION N/A 05/04/2021   Procedure: CARDIOVERSION;  Surgeon: Elouise Munroe, MD;  Location: [facility] ENDOSCOPY;  Service: Cardiovascular;  Laterality: N/A;   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 06/08/2015   Procedure: CONVERSION TO TOTAL HIP, ANTERIOR TOTAL HIP;  Surgeon: Kathryne Hitch, MD;  Location: [place];  Service: Orthopedics;  Laterality: Right;   FRACTURE SURGERY     right hip   TEE WITHOUT CARDIOVERSION N/A 05/04/2021   Procedure: TRANSESOPHAGEAL ECHOCARDIOGRAM (TEE);  Surgeon: Elouise Munroe, MD;  Location: [facility] ENDOSCOPY;  Service: Cardiovascular;  Laterality: N/A;   TOTAL HIP ARTHROPLASTY Right 06/08/2015   TUBAL LIGATION     WISDOM TOOTH EXTRACTION      Current Medications: Current Meds  Medication Sig   ELIQUIS 5 MG TABS tablet Take 1 tablet (5 mg total) by mouth 2 (two) times daily.   Evolocumab (REPATHA SURECLICK) 932 MG/ML SOAJ Inject 140 mg into the skin every 14 (fourteen) days.   LORazepam (ATIVAN) 0.5 MG tablet Take 1 tablet (0.5 mg total) by  mouth 2 (two) times daily as needed for anxiety (try with 1/2 pill first). (Patient taking differently: Take 0.25-0.5 mg by mouth 2 (two) times daily as needed for anxiety.)   metoprolol succinate (TOPROL-XL) 25 MG 24 hr tablet Take one tablet by mouth daily daily in the morning and 0.5 tablet by mouth daily in the evening. (Patient taking differently: Take 12.5-25 mg by mouth See admin instructions. Take one tablet by mouth daily daily in the morning and 0.5 tablet by mouth daily in the evening.)     Allergies:   Perflutren, Atorvastatin, Other, Pravastatin, Sulfa antibiotics, and Sulfamethoxazole    Social History   Socioeconomic History   Marital status: Single    Spouse name: Not on file   Number of children: 3   Years of education: Not on file   Highest education level: Not on file  Occupational History    Comment: full time  Tobacco Use   Smoking status: Never   Smokeless tobacco: Never  Vaping Use   Vaping Use: Never used  Substance and Sexual Activity   Alcohol use: No   Drug use: No   Sexual activity: Not Currently  Other Topics Concern   Not on file  Social History Narrative   Lives alone   Right Handed   Drinks no caffeine   Social Determinants of Health   Financial Resource Strain: Not on file  Food Insecurity: Not on file  Transportation Needs: Not on file  Physical Activity: Not on file  Stress: Not on file  Social Connections: Not on file     Family History: The patient's family history includes Cancer in her father and mother; Hypertension in an other family member.  ROS:   Review of Systems  Constitution: Negative for decreased appetite, fever and weight gain.  HENT: Negative for congestion, ear discharge, hoarse voice and sore throat.   Eyes: Negative for discharge, redness, vision loss in right eye and visual halos.  Cardiovascular: Negative for chest pain, dyspnea on exertion, leg swelling, orthopnea and palpitations.  Respiratory: Negative for cough, hemoptysis, shortness of breath and snoring.   Endocrine: Negative for heat intolerance and polyphagia.  Hematologic/Lymphatic: Negative for bleeding problem. Does not bruise/bleed easily.  Skin: Negative for flushing, nail changes, rash and suspicious lesions.  Musculoskeletal: Negative for arthritis, joint pain, muscle cramps, myalgias, neck pain and stiffness.  Gastrointestinal: Negative for abdominal pain, bowel incontinence, diarrhea and excessive appetite.  Genitourinary: Negative for decreased libido, genital sores and incomplete emptying.  Neurological: Negative for brief paralysis,  focal weakness, headaches and loss of balance.  Psychiatric/Behavioral: Negative for altered mental status, depression and suicidal ideas.  Allergic/Immunologic: Negative for HIV exposure and persistent infections.    EKGs/Labs/Other Studies Reviewed:    The following studies were reviewed today:   EKG:  The ekg ordered today demonstrates atrial fibrillation, heart rate 71 bpm with controlled ventricular rate.  By the prior EKG no significant change.  Nuclear stress test 03/12/2021 Nuclear stress EF: 67%. The left ventricular ejection fraction is hyperdynamic (>65%). There was no ST segment deviation noted during stress. This is a low risk study. No evidence of ischemia or MI.   TEE 05/04/2021 IMPRESSIONS   1. Left ventricular ejection fraction, by estimation, is 60 to 65%. The left ventricle has normal function.   2. Right ventricular systolic function is normal. The right ventricular size is normal.   3. Left atrial size was mildly dilated. No left atrial/left atrial appendage thrombus was detected. The LAA  emptying velocity was 39 cm/s.   4. The mitral valve is normal in structure. Trivial mitral valve regurgitation.   5. The aortic valve is tricuspid. Aortic valve regurgitation is not visualized. No aortic stenosis is present.   6. There is mild (Grade II) atheroma plaque involving the transverse aorta.   7. Evidence of atrial level shunting detected by color flow Doppler.  There is a small patent foramen ovale with predominantly left to right shunting across the atrial septum.   Conclusion(s)/Recommendation(s): No LA/LAA thrombus identified. Unsuccessful cardioversion performed without restoration of normal sinus rhythm.   FINDINGS   Left Ventricle: Left ventricular ejection fraction, by estimation, is 60 to 65%. The left ventricle has normal function. The left ventricular internal cavity size was normal in size.   Right Ventricle: The right ventricular size is normal. No  increase in right ventricular wall thickness. Right ventricular systolic function is normal.   Left Atrium: Left atrial size was mildly dilated. No left atrial/left atrial appendage thrombus was detected. The LAA emptying velocity was 39cm/s.   Right Atrium: Right atrial size was normal in size.   Pericardium: There is no evidence of pericardial effusion.   Mitral Valve: The mitral valve is normal in structure. Trivial mitral valve regurgitation.   Tricuspid Valve: The tricuspid valve is normal in structure. Tricuspid valve regurgitation is trivial.   Aortic Valve: The aortic valve is tricuspid. Aortic valve regurgitation is not visualized. No aortic stenosis is present.   Pulmonic Valve: The pulmonic valve was normal in structure. Pulmonic valve regurgitation is trivial.   Aorta: The aortic root and ascending aorta are structurally normal, with no evidence of dilitation. There is mild (Grade II) atheroma plaque involving the transverse aorta.   IAS/Shunts: Evidence of atrial level shunting detected by color flow Doppler. Agitated saline contrast was given intravenously to evaluate for intracardiac shunting. A small patent foramen ovale is detected with predominantly left to right shunting across the atrial septum.    Recent Labs: 04/26/2021: BUN 11; Creatinine, Ser 0.68; Hemoglobin 14.9; Magnesium 2.1; Platelets 266; Potassium 4.9; Sodium 140  Recent Lipid Panel    Component Value Date/Time   CHOL 196 04/26/2021 0934   TRIG 68 04/26/2021 0934   HDL 62 04/26/2021 0934   CHOLHDL 3.2 04/26/2021 0934   LDLCALC 122 (H) 04/26/2021 0934    Physical Exam:    VS:  BP 118/68 (BP Location: Left Arm, Patient Position: Sitting, Cuff Size: Large)   Pulse 71   Ht 5\' 5"  (1.651 m)   Wt 255 lb (115.7 kg)   SpO2 96%   BMI 42.43 kg/m     Wt Readings from Last 3 Encounters:  06/02/21 255 lb (115.7 kg)  05/25/21 256 lb (116.1 kg)  05/18/21 243 lb (110.2 kg)     GEN: Well nourished, well  developed in no acute distress HEENT: Normal NECK: No JVD; No carotid bruits LYMPHATICS: No lymphadenopathy CARDIAC: S1S2 noted,RRR, no murmurs, rubs, gallops RESPIRATORY:  Clear to auscultation without rales, wheezing or rhonchi  ABDOMEN: Soft, non-tender, non-distended, +bowel sounds, no guarding. EXTREMITIES: No edema, No cyanosis, no clubbing MUSCULOSKELETAL:  No deformity  SKIN: Warm and dry NEUROLOGIC:  Alert and oriented x 3, non-focal PSYCHIATRIC:  Normal affect, good insight  ASSESSMENT:    1. Persistent atrial fibrillation ([place])   2. OSA (obstructive sleep apnea)   3. Morbid obesity ([place])   4. Hypertension, unspecified type   5. Mixed hyperlipidemia   6. History of CVA (cerebrovascular accident)  7. Chronic anticoagulation    PLAN:     She still is in atrial fibrillation today.  She tells me that she is still symptomatic in A. fib.  Unfortunately she was unable to convert with a TEE cardioversion.  I discussed with the patient for our options for rhythm control which could include antiarrhythmics started and then cardioversion, she may not be a great candidate for ablation based on her body habitus.  But after a long discussion shared decision I will refer the patient to my EP colleagues and defer to them on decision for antiarrhythmics versus possible PCI.  For now we will continue her metoprolol as well as her Eliquis.   She has OSA and has not been able to get her follow-up testing for her CPAP titration.  We discussed that she is going to try to get this done.  The patient understands the need to lose weight with diet and exercise. We have discussed specific strategies for this.  She has started her PCSK9 inhibitors and she is very happy with this.  The patient is in agreement with the above plan. The patient left the office in stable condition.  The patient will follow up in   Medication Adjustments/Labs and Tests Ordered: Current medicines are reviewed at  length with the patient today.  Concerns regarding medicines are outlined above.  Orders Placed This Encounter  Procedures   Ambulatory referral to Cardiac Electrophysiology   EKG 12-Lead   No orders of the defined types were placed in this encounter.   Patient Instructions  Medication Instructions:  Your physician recommends that you continue on your current medications as directed. Please refer to the Current Medication list given to you today.  *If you need a refill on your cardiac medications before your next appointment, please call your pharmacy*   Lab Work: None If you have labs (blood work) drawn today and your tests are completely normal, you will receive your results only by: [place] (if you have MyChart) OR A paper copy in the mail If you have any lab test that is abnormal or we need to change your treatment, we will call you to review the results.   Testing/Procedures: None   Follow-Up: At [facility], you and your health needs are our priority.  As part of our continuing mission to provide you with exceptional heart care, we have created designated Provider Care Teams.  These Care Teams include your primary Cardiologist (physician) and Advanced Practice Providers (APPs -  Physician Assistants and Nurse Practitioners) who all work together to provide you with the care you need, when you need it.  We recommend signing up for the patient portal called "MyChart".  Sign up information is provided on this After Visit Summary.  MyChart is used to connect with patients for Virtual Visits (Telemedicine).  Patients are able to view lab/test results, encounter notes, upcoming appointments, etc.  Non-urgent messages can be sent to your provider as well.   To learn more about what you can do with MyChart, go to [email or web address].    Your next appointment:   6 month(s)  The format for your next appointment:   In Person  Provider:   [address] - Teresa Salines,  DO    Other Instructions  Please contact Barry Brunner, she left a message on your phone on 04/21/21. She will help you set up your next steps to possibility get a CPAP machine.    Adopting a Healthy Lifestyle.  Know what  a healthy weight is for you (roughly BMI <25) and aim to maintain this   Aim for 7+ servings of fruits and vegetables daily   65-80+ fluid ounces of water or unsweet tea for healthy kidneys   Limit to max 1 drink of alcohol per day; avoid smoking/tobacco   Limit animal fats in diet for cholesterol and heart health - choose grass fed whenever available   Avoid highly processed foods, and foods high in saturated/trans fats   Aim for low stress - take time to unwind and care for your mental health   Aim for 150 min of moderate intensity exercise weekly for heart health, and weights twice weekly for bone health   Aim for 7-9 hours of sleep daily   When it comes to diets, agreement about the perfect plan isnt easy to find, even among the experts. Experts at the [employer] developed an idea known as the Healthy Eating Plate. Just imagine a plate divided into logical, healthy portions.   The emphasis is on diet quality:   Load up on vegetables and fruits - one-half of your plate: Aim for color and variety, and remember that potatoes dont count.   Go for whole grains - one-quarter of your plate: Whole wheat, barley, wheat berries, quinoa, oats, brown rice, and foods made with them. If you want pasta, go with whole wheat pasta.   Protein power - one-quarter of your plate: Fish, chicken, beans, and nuts are all healthy, versatile protein sources. Limit red meat.   The diet, however, does go beyond the plate, offering a few other suggestions.   Use healthy plant oils, such as olive, canola, soy, corn, sunflower and peanut. Check the labels, and avoid partially hydrogenated oil, which have unhealthy trans fats.   If youre thirsty, drink water.  Coffee and tea are good in moderation, but skip sugary drinks and limit milk and dairy products to one or two daily servings.   The type of carbohydrate in the diet is more important than the amount. Some sources of carbohydrates, such as vegetables, fruits, whole grains, and beans-are healthier than others.   Finally, stay active  Signed, Teresa Salines, DO  06/02/2021 8:58 AM    [place]

## 2021-06-02 NOTE — Telephone Encounter (Signed)
Patient returned a call and was given sleep study results. She was informed by Dr Harriet Masson at her visit today. I explained to her the reason for the titration sleep study and the process for scheduling this. She has agreed to proceeding with it. Prior Authorization for CPAP titration sent to [facility] via Fax .

## 2021-06-02 NOTE — Telephone Encounter (Signed)
-----   Message from Teresa Sine, MD sent at 03/30/2021  8:41 AM EDT ----- Mariann Laster, please notify pt and schedule for CPAP titration

## 2021-06-20 NOTE — Telephone Encounter (Signed)
PA request for CPAP titration was approved by [facility]. Auth # [id number]. Valid dates 06/02/21 to 08/31/21.

## 2021-06-21 ENCOUNTER — Other Ambulatory Visit: Payer: Self-pay | Admitting: Cardiovascular Disease

## 2021-06-21 DIAGNOSIS — G4733 Obstructive sleep apnea (adult) (pediatric): Secondary | ICD-10-CM

## 2021-06-22 ENCOUNTER — Ambulatory Visit (INDEPENDENT_AMBULATORY_CARE_PROVIDER_SITE_OTHER): Payer: PPO | Admitting: Legal Medicine

## 2021-06-22 ENCOUNTER — Other Ambulatory Visit: Payer: Self-pay

## 2021-06-22 ENCOUNTER — Encounter: Payer: Self-pay | Admitting: Legal Medicine

## 2021-06-22 VITALS — BP 120/60 | HR 86 | Temp 96.7°F | Resp 16 | Ht 65.0 in | Wt 253.0 lb

## 2021-06-22 DIAGNOSIS — N3090 Cystitis, unspecified without hematuria: Secondary | ICD-10-CM | POA: Diagnosis not present

## 2021-06-22 LAB — POCT URINALYSIS DIP (CLINITEK)
Bilirubin, UA: NEGATIVE
Glucose, UA: NEGATIVE mg/dL
Ketones, POC UA: NEGATIVE mg/dL
Nitrite, UA: NEGATIVE
Spec Grav, UA: >=1.03 — AB (ref 1.010–1.025)
Urobilinogen, UA: 0.2 E.U./dL
pH, UA: 5 (ref 5.0–8.0)

## 2021-06-22 MED ORDER — CIPROFLOXACIN HCL 500 MG PO TABS: 500.0000 mg | ORAL_TABLET | Freq: Two times a day (BID) | ORAL | 0 refills | Status: AC

## 2021-06-22 NOTE — Progress Notes (Signed)
Established Patient Office Visit  Subjective:  Patient ID: Teresa Blair, female    DOB: 1951-09-27  Age: 70 y.o. MRN: [id number]  CC:  Chief Complaint  Patient presents with   Urinary Tract Infection    HPI Teresa Blair presents for UTI.  Patient is having dysuria and frequency for one week. No fever or chills.  Past Medical History:  Diagnosis Date   A-fib [facility])    Anxiety 12/01/2020   Asthma    as a child   Atrial fibrillation ([place]) 09/16/2020   Chronic anticoagulation 12/01/2020   Closed fracture of right acetabulum ([place]) 09/03/2011   Enthesopathy of hip region 09/03/2011   Gestational diabetes    Headache    History of CVA (cerebrovascular accident) 09/16/2020   Hypercholesterolemia    Hypertension    Kidney stones    Mixed hyperlipidemia 09/16/2020   Morbid obesity ([place]) 09/16/2020   Obesity    Osteoarthritis    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   Primary hypertension 09/16/2020   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   Right hip pain 09/03/2011   Scleroderma, localized    Sequelae, 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   Stroke ([place]) 09/12/2020   left side weakness/speech affected   Wears glasses     Past Surgical History:  Procedure Laterality Date   BUBBLE STUDY  05/04/2021   Procedure: BUBBLE STUDY;  Surgeon: Elouise Munroe, MD;  Location: [place];  Service: Cardiovascular;;   CARDIOVERSION N/A 05/04/2021   Procedure: CARDIOVERSION;  Surgeon: Elouise Munroe, MD;  Location: [facility] ENDOSCOPY;  Service: Cardiovascular;  Laterality: N/A;   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 06/08/2015   Procedure: CONVERSION TO TOTAL HIP, ANTERIOR TOTAL HIP;  Surgeon: Kathryne Hitch, MD;  Location: [place];  Service: Orthopedics;  Laterality: Right;   FRACTURE SURGERY     right hip   TEE WITHOUT CARDIOVERSION N/A 05/04/2021   Procedure: TRANSESOPHAGEAL ECHOCARDIOGRAM (TEE);  Surgeon: Elouise Munroe, MD;  Location: [facility] ENDOSCOPY;  Service:  Cardiovascular;  Laterality: N/A;   TOTAL HIP ARTHROPLASTY Right 06/08/2015   TUBAL LIGATION     WISDOM TOOTH EXTRACTION      Family History  Problem Relation Age of Onset   Cancer Mother    Cancer Father    Hypertension Other     Social History   Socioeconomic History   Marital status: Single    Spouse name: Not on file   Number of children: 3   Years of education: Not on file   Highest education level: Not on file  Occupational History    Comment: full time  Tobacco Use   Smoking status: Never   Smokeless tobacco: Never  Vaping Use   Vaping Use: Never used  Substance and Sexual Activity   Alcohol use: No   Drug use: No   Sexual activity: Not Currently  Other Topics Concern   Not on file  Social History Narrative   Lives alone   Right Handed   Drinks no caffeine   Social Determinants of Health   Financial Resource Strain: Not on file  Food Insecurity: Not on file  Transportation Needs: Not on file  Physical Activity: Not on file  Stress: Not on file  Social Connections: Not on file  Intimate Partner Violence: Not on file    Outpatient Medications Prior to Visit  Medication Sig Dispense Refill   ELIQUIS 5 MG TABS tablet Take 1 tablet (5 mg total) by mouth 2 (two) times daily.  60 tablet 12   Evolocumab (REPATHA SURECLICK) 998 MG/ML SOAJ Inject 140 mg into the skin every 14 (fourteen) days. 2 mL 11   LORazepam (ATIVAN) 0.5 MG tablet Take 1 tablet (0.5 mg total) by mouth 2 (two) times daily as needed for anxiety (try with 1/2 pill first). (Patient taking differently: Take 0.25-0.5 mg by mouth 2 (two) times daily as needed for anxiety.) 30 tablet 3   metoprolol succinate (TOPROL-XL) 25 MG 24 hr tablet Take one tablet by mouth daily daily in the morning and 0.5 tablet by mouth daily in the evening. (Patient taking differently: Take 12.5-25 mg by mouth See admin instructions. Take one tablet by mouth daily daily in the morning and 0.5 tablet by mouth daily in the  evening.) 135 tablet 3   No facility-administered medications prior to visit.    Allergies  Allergen Reactions   Perflutren Shortness Of Breath    Protein contrast (pain radiating from feet through spine)   Atorvastatin Other (See Comments)    Joint pain, aches   Other Hives and Swelling    Poppy seeds   Pravastatin Other (See Comments)    Joint pain, aches   Sulfa Antibiotics Hives   Sulfamethoxazole Rash    ROS Review of Systems  Constitutional:  Negative for activity change and appetite change.  HENT:  Negative for congestion.   Eyes:  Negative for visual disturbance.  Respiratory:  Negative for chest tightness and shortness of breath.   Cardiovascular:  Negative for chest pain and palpitations.  Gastrointestinal:  Negative for abdominal distention and abdominal pain.  Endocrine: Negative for polyuria.  Genitourinary:  Positive for dysuria and urgency. Negative for difficulty urinating.  Musculoskeletal:  Negative for arthralgias and back pain.  Skin: Negative.   Neurological: Negative.   Psychiatric/Behavioral: Negative.       Objective:    Physical Exam Vitals reviewed.  Constitutional:      Appearance: Normal appearance. She is obese.  HENT:     Right Ear: Tympanic membrane normal.     Left Ear: Tympanic membrane normal.     Mouth/Throat:     Mouth: Mucous membranes are moist.     Pharynx: Oropharynx is clear.  Eyes:     Extraocular Movements: Extraocular movements intact.     Conjunctiva/sclera: Conjunctivae normal.     Pupils: Pupils are equal, round, and reactive to light.  Cardiovascular:     Rate and Rhythm: Normal rate and regular rhythm.     Pulses: Normal pulses.     Heart sounds: Normal heart sounds. No murmur heard.   No gallop.  Pulmonary:     Effort: Pulmonary effort is normal. No respiratory distress.     Breath sounds: Normal breath sounds. No wheezing.  Abdominal:     General: Abdomen is flat. Bowel sounds are normal. There is no  distension.     Tenderness: There is no abdominal tenderness.  Musculoskeletal:        General: Normal range of motion.     Cervical back: Normal range of motion and neck supple.  Skin:    General: Skin is warm.     Capillary Refill: Capillary refill takes less than 2 seconds.  Neurological:     General: No focal deficit present.     Mental Status: She is alert and oriented to person, place, and time. Mental status is at baseline.    BP 120/60   Pulse 86   Temp (!) 96.7 F (35.9 C)  Resp 16   Ht $R'5\' 5"'tr$  (1.651 m)   Wt 253 lb (114.8 kg)   SpO2 97%   BMI 42.10 kg/m  Wt Readings from Last 3 Encounters:  06/22/21 253 lb (114.8 kg)  06/02/21 255 lb (115.7 kg)  05/25/21 256 lb (116.1 kg)     Health Maintenance Due  Topic Date Due   Hepatitis C Screening  Never done   Zoster Vaccines- Shingrix (1 of 2) Never done   COLONOSCOPY (Pts 45-15yrs Insurance coverage will need to be confirmed)  Never done   MAMMOGRAM  Never done   DEXA SCAN  Never done   COVID-19 Vaccine (3 - Pfizer risk series) 08/05/2020    There are no preventive care reminders to display for this patient.  No results found for: TSH Lab Results  Component Value Date   WBC 11.3 (H) 04/26/2021   HGB 14.9 04/26/2021   HCT 44.9 04/26/2021   MCV 86 04/26/2021   PLT 266 04/26/2021   Lab Results  Component Value Date   NA 140 04/26/2021   K 4.9 04/26/2021   CO2 21 04/26/2021   GLUCOSE 121 (H) 04/26/2021   BUN 11 04/26/2021   CREATININE 0.68 04/26/2021   BILITOT 0.5 05/26/2015   ALKPHOS 68 05/26/2015   AST 18 05/26/2015   ALT 20 05/26/2015   PROT 7.2 05/26/2015   ALBUMIN 4.1 05/26/2015   CALCIUM 9.8 04/26/2021   ANIONGAP 4 (L) 06/10/2015   EGFR 95 04/26/2021   Lab Results  Component Value Date   CHOL 196 04/26/2021   Lab Results  Component Value Date   HDL 62 04/26/2021   Lab Results  Component Value Date   LDLCALC 122 (H) 04/26/2021   Lab Results  Component Value Date   TRIG 68  04/26/2021   Lab Results  Component Value Date   CHOLHDL 3.2 04/26/2021   No results found for: HGBA1C    Assessment & Plan:   Problem List Items Addressed This Visit       Genitourinary   Cystitis - Primary   Relevant Medications   ciprofloxacin (CIPRO) 500 MG tablet   Other Relevant Orders   POCT URINALYSIS DIP (CLINITEK) (Completed)   Urine Culture  Patient has UTI and we will culture and treat with cipro  Meds ordered this encounter  Medications   ciprofloxacin (CIPRO) 500 MG tablet    Sig: Take 1 tablet (500 mg total) by mouth 2 (two) times daily for 10 days.    Dispense:  20 tablet    Refill:  0    Follow-up: Return if symptoms worsen or fail to improve.    Reinaldo Meeker, MD

## 2021-06-25 LAB — URINE CULTURE

## 2021-06-25 NOTE — Progress Notes (Signed)
Klebsiella pneumoniae sensitive to cipro continue medicines lp

## 2021-06-26 ENCOUNTER — Ambulatory Visit: Payer: PPO

## 2021-07-01 ENCOUNTER — Encounter: Payer: Self-pay | Admitting: Legal Medicine

## 2021-07-02 DIAGNOSIS — Z719 Counseling, unspecified: Secondary | ICD-10-CM | POA: Diagnosis not present

## 2021-07-02 DIAGNOSIS — R1030 Lower abdominal pain, unspecified: Secondary | ICD-10-CM | POA: Diagnosis not present

## 2021-07-02 DIAGNOSIS — N390 Urinary tract infection, site not specified: Secondary | ICD-10-CM | POA: Diagnosis not present

## 2021-07-03 ENCOUNTER — Ambulatory Visit: Payer: PPO | Admitting: Legal Medicine

## 2021-07-03 ENCOUNTER — Other Ambulatory Visit: Payer: Self-pay | Admitting: Legal Medicine

## 2021-07-03 MED ORDER — CIPROFLOXACIN HCL 500 MG PO TABS: 500.0000 mg | ORAL_TABLET | Freq: Two times a day (BID) | ORAL | 0 refills | Status: DC

## 2021-07-04 ENCOUNTER — Ambulatory Visit (INDEPENDENT_AMBULATORY_CARE_PROVIDER_SITE_OTHER): Payer: PPO | Admitting: Legal Medicine

## 2021-07-04 ENCOUNTER — Encounter: Payer: Self-pay | Admitting: Legal Medicine

## 2021-07-04 ENCOUNTER — Other Ambulatory Visit: Payer: Self-pay

## 2021-07-04 VITALS — BP 110/80 | HR 72 | Temp 97.0°F | Ht 65.0 in | Wt 253.2 lb

## 2021-07-04 DIAGNOSIS — N200 Calculus of kidney: Secondary | ICD-10-CM

## 2021-07-04 DIAGNOSIS — R32 Unspecified urinary incontinence: Secondary | ICD-10-CM

## 2021-07-04 DIAGNOSIS — K760 Fatty (change of) liver, not elsewhere classified: Secondary | ICD-10-CM | POA: Diagnosis not present

## 2021-07-04 DIAGNOSIS — N23 Unspecified renal colic: Secondary | ICD-10-CM

## 2021-07-04 DIAGNOSIS — M5136 Other intervertebral disc degeneration, lumbar region: Secondary | ICD-10-CM | POA: Diagnosis not present

## 2021-07-04 DIAGNOSIS — I700 Atherosclerosis of aorta: Secondary | ICD-10-CM | POA: Diagnosis not present

## 2021-07-04 DIAGNOSIS — R109 Unspecified abdominal pain: Secondary | ICD-10-CM | POA: Diagnosis not present

## 2021-07-04 LAB — POCT URINALYSIS DIP (CLINITEK)
Bilirubin, UA: NEGATIVE
Glucose, UA: NEGATIVE mg/dL
Ketones, POC UA: NEGATIVE mg/dL
Nitrite, UA: NEGATIVE
POC PROTEIN,UA: NEGATIVE
Spec Grav, UA: >=1.03 — AB (ref 1.010–1.025)
Urobilinogen, UA: 0.2 E.U./dL
pH, UA: 5.5 (ref 5.0–8.0)

## 2021-07-04 MED ORDER — CIPROFLOXACIN HCL 500 MG PO TABS: 500.0000 mg | ORAL_TABLET | Freq: Two times a day (BID) | ORAL | 0 refills | Status: AC

## 2021-07-04 NOTE — Progress Notes (Signed)
Established Patient Office Visit  Subjective:  Patient ID: Teresa Blair, female    DOB: 12-09-1950  Age: 70 y.o. MRN: [id number]  CC:  Chief Complaint  Patient presents with   Nephrolithiasis    Possible, patient states started Friday, not feeling well, Saturday sent [place] a my chart message, Sunday went to [place] and got a shot, pain is a 9, had to go get diapers for being incontinent per patient. Since got home yesterday she is doing a little better but still in pain. Patient is located right side of back, radiates to pelvic area, double over in pain and had to walk with cane.     HPI Teresa Blair presents for flank pain.  Started 3 days ago right side severe that doubled her over.  She was incontinent.  She went to urgent care who told her she had muscle pain. She still has pain, blood and leukocyste sin urine. LAST RENAL CALCULUS 15 YEARS AGO.  Past Medical History:  Diagnosis Date   A-fib [facility])    Anxiety 12/01/2020   Asthma    as a child   Atrial fibrillation ([place]) 09/16/2020   Chronic anticoagulation 12/01/2020   Closed fracture of right acetabulum ([place]) 09/03/2011   Enthesopathy of hip region 09/03/2011   Gestational diabetes    Headache    History of CVA (cerebrovascular accident) 09/16/2020   Hypercholesterolemia    Hypertension    Kidney stones    Mixed hyperlipidemia 09/16/2020   Morbid obesity ([place]) 09/16/2020   Obesity    Osteoarthritis    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   Primary hypertension 09/16/2020   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   Right hip pain 09/03/2011   Scleroderma, localized    Sequelae, 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   Stroke ([place]) 09/12/2020   left side weakness/speech affected   Wears glasses     Past Surgical History:  Procedure Laterality Date   BUBBLE STUDY  05/04/2021   Procedure: BUBBLE STUDY;  Surgeon: Elouise Munroe, MD;  Location: [place];  Service: Cardiovascular;;    CARDIOVERSION N/A 05/04/2021   Procedure: CARDIOVERSION;  Surgeon: Elouise Munroe, MD;  Location: [facility] ENDOSCOPY;  Service: Cardiovascular;  Laterality: N/A;   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 06/08/2015   Procedure: CONVERSION TO TOTAL HIP, ANTERIOR TOTAL HIP;  Surgeon: Kathryne Hitch, MD;  Location: [place];  Service: Orthopedics;  Laterality: Right;   FRACTURE SURGERY     right hip   TEE WITHOUT CARDIOVERSION N/A 05/04/2021   Procedure: TRANSESOPHAGEAL ECHOCARDIOGRAM (TEE);  Surgeon: Elouise Munroe, MD;  Location: [facility] ENDOSCOPY;  Service: Cardiovascular;  Laterality: N/A;   TOTAL HIP ARTHROPLASTY Right 06/08/2015   TUBAL LIGATION     WISDOM TOOTH EXTRACTION      Family History  Problem Relation Age of Onset   Cancer Mother    Cancer Father    Hypertension Other     Social History   Socioeconomic History   Marital status: Single    Spouse name: Not on file   Number of children: 3   Years of education: Not on file   Highest education level: Not on file  Occupational History    Comment: full time  Tobacco Use   Smoking status: Never   Smokeless tobacco: Never  Vaping Use   Vaping Use: Never used  Substance and Sexual Activity   Alcohol use: No   Drug use: No   Sexual activity: Not Currently  Other  Topics Concern   Not on file  Social History Narrative   Lives alone   Right Handed   Drinks no caffeine   Social Determinants of Health   Financial Resource Strain: Not on file  Food Insecurity: Not on file  Transportation Needs: Not on file  Physical Activity: Not on file  Stress: Not on file  Social Connections: Not on file  Intimate Partner Violence: Not on file    Outpatient Medications Prior to Visit  Medication Sig Dispense Refill   ELIQUIS 5 MG TABS tablet Take 1 tablet (5 mg total) by mouth 2 (two) times daily. 60 tablet 12   Evolocumab (REPATHA SURECLICK) 268 MG/ML SOAJ Inject 140 mg into the skin every 14 (fourteen) days. 2 mL 11   LORazepam (ATIVAN) 0.5 MG  tablet Take 1 tablet (0.5 mg total) by mouth 2 (two) times daily as needed for anxiety (try with 1/2 pill first). (Patient taking differently: Take 0.25-0.5 mg by mouth 2 (two) times daily as needed for anxiety.) 30 tablet 3   metoprolol succinate (TOPROL-XL) 25 MG 24 hr tablet Take one tablet by mouth daily daily in the morning and 0.5 tablet by mouth daily in the evening. (Patient taking differently: Take 12.5-25 mg by mouth See admin instructions. Take one tablet by mouth daily daily in the morning and 0.5 tablet by mouth daily in the evening.) 135 tablet 3   ciprofloxacin (CIPRO) 500 MG tablet Take 1 tablet (500 mg total) by mouth 2 (two) times daily for 10 days. 20 tablet 0   No facility-administered medications prior to visit.    Allergies  Allergen Reactions   Perflutren Shortness Of Breath    Protein contrast (pain radiating from feet through spine)   Atorvastatin Other (See Comments)    Joint pain, aches   Other Hives and Swelling    Poppy seeds   Pravastatin Other (See Comments)    Joint pain, aches   Sulfa Antibiotics Hives   Sulfamethoxazole Rash    ROS Review of Systems  Constitutional:  Negative for activity change and appetite change.  HENT:  Negative for congestion.   Respiratory:  Negative for chest tightness and shortness of breath.   Cardiovascular:  Negative for chest pain and palpitations.  Gastrointestinal:  Negative for abdominal distention and abdominal pain.  Genitourinary:  Positive for dysuria.       Incontinence of urine.  Musculoskeletal:  Positive for back pain. Negative for arthralgias.  Neurological: Negative.   Psychiatric/Behavioral: Negative.       Objective:    Physical Exam Vitals reviewed.  Constitutional:      Appearance: Normal appearance. She is obese.  HENT:     Right Ear: Tympanic membrane, ear canal and external ear normal.     Left Ear: Tympanic membrane, ear canal and external ear normal.  Eyes:     Extraocular Movements:  Extraocular movements intact.     Conjunctiva/sclera: Conjunctivae normal.     Pupils: Pupils are equal, round, and reactive to light.  Cardiovascular:     Rate and Rhythm: Normal rate and regular rhythm.     Pulses: Normal pulses.     Heart sounds: Normal heart sounds. No murmur heard.   No gallop.  Pulmonary:     Effort: Pulmonary effort is normal. No respiratory distress.     Breath sounds: Normal breath sounds. No wheezing.  Abdominal:     General: Abdomen is flat. Bowel sounds are normal. There is no distension.  Tenderness: There is no abdominal tenderness. There is right CVA tenderness.  Musculoskeletal:        General: Normal range of motion.  Skin:    General: Skin is warm.     Capillary Refill: Capillary refill takes less than 2 seconds.  Neurological:     General: No focal deficit present.     Mental Status: She is alert and oriented to person, place, and time. Mental status is at baseline.  Psychiatric:        Mood and Affect: Mood normal.    BP 110/80   Pulse 72   Temp (!) 97 F (36.1 C)   Ht _0  (1.651 m)   Wt 253 lb 3.2 oz (114.9 kg)   SpO2 99%   BMI 42.13 kg/m  Wt Readings from Last 3 Encounters:  07/04/21 253 lb 3.2 oz (114.9 kg)  06/22/21 253 lb (114.8 kg)  06/02/21 255 lb (115.7 kg)     Health Maintenance Due  Topic Date Due   Hepatitis C Screening  Never done   Zoster Vaccines- Shingrix (1 of 2) Never done   COLONOSCOPY (Pts 45-55yr Insurance coverage will need to be confirmed)  Never done   MAMMOGRAM  Never done   DEXA SCAN  Never done   COVID-19 Vaccine (3 - Pfizer risk series) 08/05/2020   INFLUENZA VACCINE  06/26/2021    There are no preventive care reminders to display for this patient.  No results found for: TSH Lab Results  Component Value Date   WBC 11.3 (H) 04/26/2021   HGB 14.9 04/26/2021   HCT 44.9 04/26/2021   MCV 86 04/26/2021   PLT 266 04/26/2021   Lab Results  Component Value Date   NA 140 04/26/2021   K 4.9  04/26/2021   CO2 21 04/26/2021   GLUCOSE 121 (H) 04/26/2021   BUN 11 04/26/2021   CREATININE 0.68 04/26/2021   BILITOT 0.5 05/26/2015   ALKPHOS 68 05/26/2015   AST 18 05/26/2015   ALT 20 05/26/2015   PROT 7.2 05/26/2015   ALBUMIN 4.1 05/26/2015   CALCIUM 9.8 04/26/2021   ANIONGAP 4 (L) 06/10/2015   EGFR 95 04/26/2021   Lab Results  Component Value Date   CHOL 196 04/26/2021   Lab Results  Component Value Date   HDL 62 04/26/2021   Lab Results  Component Value Date   LDLCALC 122 (H) 04/26/2021   Lab Results  Component Value Date   TRIG 68 04/26/2021   Lab Results  Component Value Date   CHOLHDL 3.2 04/26/2021   No results found for: HGBA1C    Assessment & Plan:   Diagnoses and all orders for this visit: Kidney pain -     POCT URINALYSIS DIP (CLINITEK) -     Urine Culture Patient has pyuria and 3+ leuks, treat with cipro.  Urinary incontinence, unspecified type -     POCT URINALYSIS DIP (CLINITEK) She started with severe pain in [place] with right flank pain.  And incontinence on plane.  Renal calculus -     CT Abdomen Pelvis W Contrast History of renal calculus with history consistent with stone CT was normal- must have passed stone Other orders -     ciprofloxacin (CIPRO) 500 MG tablet; Take 1 tablet (500 mg total) by mouth 2 (two) times daily for 10 days.      Follow-up: Return in about 1 week (around 07/11/2021).    LReinaldo Meeker MD

## 2021-07-05 LAB — URINE CULTURE: Organism ID, Bacteria: NO GROWTH

## 2021-07-06 NOTE — Progress Notes (Signed)
Urine culture negative ?lp

## 2021-07-13 DIAGNOSIS — E782 Mixed hyperlipidemia: Secondary | ICD-10-CM | POA: Diagnosis not present

## 2021-07-13 LAB — HEPATIC FUNCTION PANEL
ALT: 17 IU/L (ref 0–32)
AST: 14 IU/L (ref 0–40)
Albumin: 4.3 g/dL (ref 3.8–4.8)
Alkaline Phosphatase: 70 IU/L (ref 44–121)
Bilirubin Total: 0.4 mg/dL (ref 0.0–1.2)
Bilirubin, Direct: 0.14 mg/dL (ref 0.00–0.40)
Total Protein: 6.4 g/dL (ref 6.0–8.5)

## 2021-07-13 LAB — LIPID PANEL
Chol/HDL Ratio: 2.2 ratio (ref 0.0–4.4)
Cholesterol, Total: 119 mg/dL (ref 100–199)
HDL: 54 mg/dL (ref >39)
LDL Chol Calc (NIH): 46 mg/dL (ref 0–99)
Triglycerides: 103 mg/dL (ref 0–149)
VLDL Cholesterol Cal: 19 mg/dL (ref 5–40)

## 2021-07-17 ENCOUNTER — Encounter: Payer: Self-pay | Admitting: Cardiology

## 2021-07-17 ENCOUNTER — Ambulatory Visit: Payer: PPO | Admitting: Cardiology

## 2021-07-17 ENCOUNTER — Other Ambulatory Visit: Payer: Self-pay

## 2021-07-17 VITALS — BP 130/68 | HR 83 | Ht 64.0 in | Wt 254.8 lb

## 2021-07-17 DIAGNOSIS — I4819 Other persistent atrial fibrillation: Secondary | ICD-10-CM | POA: Diagnosis not present

## 2021-07-17 DIAGNOSIS — Z01812 Encounter for preprocedural laboratory examination: Secondary | ICD-10-CM

## 2021-07-17 DIAGNOSIS — Z01818 Encounter for other preprocedural examination: Secondary | ICD-10-CM | POA: Diagnosis not present

## 2021-07-17 NOTE — Progress Notes (Signed)
Electrophysiology Office Note   Date:  07/17/2021   ID:  Teresa Blair, Teresa Blair [birth year], MRN [id number]  PCP:  Lillard Anes, MD  Cardiologist:  Tobb Primary Electrophysiologist:  Jermaine Neuharth Meredith Leeds, MD    Chief Complaint: AF   History of Present Illness: Teresa Blair is a 70 y.o. female who is being seen today for the evaluation of AF at the request of Tobb, Kardie, DO. Presenting today for electrophysiology evaluation.  She has a history of persistent atrial fibrillation, CVA, hypertension.  She was hospitalized October 2021 with CVA and new onset atrial fibrillation.  November 2021 she was scheduled for a TEE cardioversion, though she had a left atrial appendage thrombus and thus cardioversion was not pursued.  She had another attempt at TEE and cardioversion 04/26/2021, though she did not convert to sinus rhythm.  Today, she denies symptoms of palpitations, chest pain, orthopnea, PND, lower extremity edema, claudication, dizziness, presyncope, syncope, bleeding, or neurologic sequela. The patient is tolerating medications without difficulties.  She has fatigue and shortness of breath associated with her atrial fibrillation.  She states that before her stroke, she had palpitations intermittently.  She feels that this is when she went into atrial fibrillation.  This could be a month or 2 prior to her stroke.   Past Medical History:  Diagnosis Date   A-fib [facility] [address])    Anxiety 12/01/2020   Asthma    as a child   Atrial fibrillation ([place]) 09/16/2020   Chronic anticoagulation 12/01/2020   Closed fracture of right acetabulum ([place]) 09/03/2011   Enthesopathy of hip region 09/03/2011   Gestational diabetes    Headache    History of CVA (cerebrovascular accident) 09/16/2020   Hypercholesterolemia    Hypertension    Kidney stones    Mixed hyperlipidemia 09/16/2020   Morbid obesity ([place]) 09/16/2020   Obesity    Osteoarthritis    Post-traumatic osteoarthritis of right hip  09/03/2011   Primary hypertension 09/16/2020   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   Right hip pain 09/03/2011   Scleroderma, localized    Sequelae, 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   Stroke ([place]) 09/12/2020   left side weakness/speech affected   Wears glasses    Past Surgical History:  Procedure Laterality Date   BUBBLE STUDY  05/04/2021   Procedure: BUBBLE STUDY;  Surgeon: Elouise Munroe, MD;  Location: [place];  Service: Cardiovascular;;   CARDIOVERSION N/A 05/04/2021   Procedure: CARDIOVERSION;  Surgeon: Elouise Munroe, MD;  Location: [facility] ENDOSCOPY;  Service: Cardiovascular;  Laterality: N/A;   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 06/08/2015   Procedure: CONVERSION TO TOTAL HIP, ANTERIOR TOTAL HIP;  Surgeon: Kathryne Hitch, MD;  Location: [place];  Service: Orthopedics;  Laterality: Right;   FRACTURE SURGERY     right hip   TEE WITHOUT CARDIOVERSION N/A 05/04/2021   Procedure: TRANSESOPHAGEAL ECHOCARDIOGRAM (TEE);  Surgeon: Elouise Munroe, MD;  Location: [facility] ENDOSCOPY;  Service: Cardiovascular;  Laterality: N/A;   TOTAL HIP ARTHROPLASTY Right 06/08/2015   TUBAL LIGATION     WISDOM TOOTH EXTRACTION       Current Outpatient Medications  Medication Sig Dispense Refill   ELIQUIS 5 MG TABS tablet Take 1 tablet (5 mg total) by mouth 2 (two) times daily. 60 tablet 12   Evolocumab (REPATHA SURECLICK) [id number] MG/ML SOAJ Inject 140 mg into the skin every 14 (fourteen) days. 2 mL 11   metoprolol succinate (TOPROL-XL) 25 MG 24 hr tablet Take one tablet by mouth  daily daily in the morning and 0.5 tablet by mouth daily in the evening. (Patient taking differently: Take 12.5-25 mg by mouth See admin instructions. Take one tablet by mouth daily daily in the morning and 0.5 tablet by mouth daily in the evening.) 135 tablet 3   LORazepam (ATIVAN) 0.5 MG tablet Take 1 tablet (0.5 mg total) by mouth 2 (two) times daily as needed for anxiety (try with 1/2 pill first). (Patient not taking: Reported  on 07/17/2021) 30 tablet 3   No current facility-administered medications for this visit.    Allergies:   Perflutren, Atorvastatin, Other, Pravastatin, Sulfa antibiotics, and Sulfamethoxazole   Social History:  The patient  reports that she has never smoked. She has never used smokeless tobacco. She reports that she does not drink alcohol and does not use drugs.   Family History:  The patient's family history includes Cancer in her father and mother; Hypertension in an other family member.    ROS:  Please see the history of present illness.   Otherwise, review of systems is positive for none.   All other systems are reviewed and negative.    PHYSICAL EXAM: VS:  BP 130/68   Pulse 83   Ht '5\' 4"'$  (1.626 m)   Wt 254 lb 12.8 oz (115.6 kg)   SpO2 98%   BMI 43.74 kg/m  , BMI Body mass index is 43.74 kg/m. GEN: Well nourished, well developed, in no acute distress  HEENT: normal  Neck: no JVD, carotid bruits, or masses Cardiac: Irregular; no murmurs, rubs, or gallops,no edema  Respiratory:  clear to auscultation bilaterally, normal work of breathing GI: soft, nontender, nondistended, + BS MS: no deformity or atrophy  Skin: warm and dry Neuro:  Strength and sensation are intact Psych: euthymic mood, full affect  EKG:  EKG is ordered today. Personal review of the ekg ordered shows atrial fibrillation, rate.  Recent Labs: 04/26/2021: BUN 11; Creatinine, Ser 0.68; Hemoglobin 14.9; Magnesium 2.1; Platelets 266; Potassium 4.9; Sodium 140 07/13/2021: ALT 17    Lipid Panel     Component Value Date/Time   CHOL 119 07/13/2021 0810   TRIG 103 07/13/2021 0810   HDL 54 07/13/2021 0810   CHOLHDL 2.2 07/13/2021 0810   LDLCALC 46 07/13/2021 0810     Wt Readings from Last 3 Encounters:  07/17/21 254 lb 12.8 oz (115.6 kg)  07/04/21 253 lb 3.2 oz (114.9 kg)  06/22/21 253 lb (114.8 kg)      Other studies Reviewed: Additional studies/ records that were reviewed today include: TEE 05/17/21   Review of the above records today demonstrates:   1. Left ventricular ejection fraction, by estimation, is 60 to 65%. The  left ventricle has normal function.   2. Right ventricular systolic function is normal. The right ventricular  size is normal.   3. Left atrial size was mildly dilated. No left atrial/left atrial  appendage thrombus was detected. The LAA emptying velocity was 39 cm/s.   4. The mitral valve is normal in structure. Trivial mitral valve  regurgitation.   5. The aortic valve is tricuspid. Aortic valve regurgitation is not  visualized. No aortic stenosis is present.   6. There is mild (Grade II) atheroma plaque involving the transverse  aorta.   7. Evidence of atrial level shunting detected by color flow Doppler.  There is a small patent foramen ovale with predominantly left to right  shunting across the atrial septum.    ASSESSMENT AND PLAN:  1.  Persistent atrial fibrillation: Currently on Eliquis.  CHA2DS2-VASc of at least 4.  She is currently in atrial fibrillation shortness of breath.  She went back into normal rhythm.  I discussed antiarrhythmic medications, she has quite medication and would like to further medications.  Due to that, we Teresa Blair plan for ablation.  This benefits have been discussed which she understands and is agreed to the procedure.   2.  Obstructive sleep apnea: CPAP compliance encouraged  3.  Hypertension currently well controlled.  4.  Morbid obesity: Diet and exercise encouraged  Case discussed with primary cardiology  Current medicines are reviewed at length with the patient today.   The patient does not have concerns regarding her medicines.  The following changes were made today:  none  Labs/ tests ordered today include:  Orders Placed This Encounter  Procedures   CT CARDIAC MORPH/PULM VEIN W/CM&W/O CA SCORE   Basic metabolic panel   CBC   EKG 12-Lead     Disposition:   FU with Shawndell Schillaci 3 months  Signed, Chevelle Durr Meredith Leeds, MD  07/17/2021 9:21 AM     [facility] [address] [place] [address] [phone] (office) [phone] (fax)

## 2021-07-17 NOTE — Patient Instructions (Signed)
Medication Instructions:  Your physician recommends that you continue on your current medications as directed. Please refer to the Current Medication list given to you today.  *If you need a refill on your cardiac medications before your next appointment, please call your pharmacy*   Lab Work: Pre procedure labs in [place] (see procedure instructions below):  BMP & CBC  If you have labs (blood work) drawn today and your tests are completely normal, you will receive your results only by: [place] (if you have MyChart) OR A paper copy in the mail If you have any lab test that is abnormal or we need to change your treatment, we will call you to review the results.   Testing/Procedures: Your physician has requested that you have cardiac CT within 7 days PRIOR to your ablation. Cardiac computed tomography (CT) is a painless test that uses an x-ray machine to take clear, detailed pictures of your heart.  Please follow instruction below located under "other instructions". You will get a call from our office to schedule the date for this test.  Your physician has recommended that you have an ablation. Catheter ablation is a medical procedure used to treat some cardiac arrhythmias (irregular heartbeats). During catheter ablation, a long, thin, flexible tube is put into a blood vessel in your groin (upper thigh), or neck. This tube is called an ablation catheter. It is then guided to your heart through the blood vessel. Radio frequency waves destroy small areas of heart tissue where abnormal heartbeats may cause an arrhythmia to start. Please follow instruction below located under "other instructions".   Follow-Up: At [facility], you and your health needs are our priority.  As part of our continuing mission to provide you with exceptional heart care, we have created designated Provider Care Teams.  These Care Teams include your primary Cardiologist (physician) and Advanced Practice Providers  (APPs -  Physician Assistants and Nurse Practitioners) who all work together to provide you with the care you need, when you need it.  Your next appointment:   1 month(s) after your ablation  The format for your next appointment:   In Person  Provider:   AFib clinic   Thank you for choosing [facility]!!   Trinidad Curet, RN [phone]    Other Instructions   CT INSTRUCTIONS Your cardiac CT will be scheduled at:  [facility] [address] [place], [place] [address] [phone]  Please arrive at the [facility] main entrance (entrance A) of [facility] 30 minutes prior to test start time. Proceed to the [facility] Radiology Department (first floor) to check-in and test prep.   Please follow these instructions carefully (unless otherwise directed):  On the Night Before the Test: Be sure to Drink plenty of water. Do not consume any caffeinated/decaffeinated beverages or chocolate 12 hours prior to your test. Do not take any antihistamines 12 hours prior to your test.  On the Day of the Test: Drink plenty of water until 1 hour prior to the test. Do not eat any food 4 hours prior to the test. You may take your regular medications prior to the test.  Take metoprolol Toprol 25 mg (whole tablet) this night prior to this test. HOLD Furosemide/Hydrochlorothiazide morning of the test. FEMALES- please wear underwire-free bra if available      After the Test: Drink plenty of water. After receiving IV contrast, you may experience a mild flushed feeling. This is normal. On occasion, you may experience  a mild rash up to 24 hours after the test. This is not dangerous. If this occurs, you can take Benadryl 25 mg and increase your fluid intake. If you experience trouble breathing, this can be serious. If it is severe call 911 IMMEDIATELY. If it is mild, please call our office. If you take any of these medications: Glipizide/Metformin, Avandament,  Glucavance, please do not take 48 hours after completing test unless otherwise instructed.   Once we have confirmed authorization from your insurance company, we will call you to set up a date and time for your test. Based on how quickly your insurance processes prior authorizations requests, please allow up to 4 weeks to be contacted for scheduling your Cardiac CT appointment. Be advised that routine Cardiac CT appointments could be scheduled as many as 8 weeks after your provider has ordered it.  For non-scheduling related questions, please contact the cardiac imaging nurse navigator should you have any questions/concerns: Marchia Bond, Cardiac Imaging Nurse Navigator Gordy Clement, Cardiac Imaging Nurse Navigator [facility] Direct Office Dial: [phone]   For scheduling needs, including cancellations and rescheduling, please call [place], [phone].     Electrophysiology/Ablation Procedure Instructions   You are scheduled for a(n)  ablation on 09/12/2021 with Dr. Allegra Lai.   1.   Pre procedure testing-             A.  LAB WORK --- between 9/26 - 10/7 for your pre procedure blood work.  You do NOT need to be fasting.  You can stop by the [place] office   On the day of your procedure 09/12/2021 you will go to [facility] ([address]) at 5:30 am.  Dennis Bast will go to the main entrance A [employer]) and enter where the [employer] are.  Your driver will drop you off and you will head down the hallway to ADMITTING.  You may have one support person come in to the hospital with you.  They will be asked to wait in the waiting room. It is OK to have someone drop you off and come back when you are ready to be discharged.   3.   Do not eat or drink after midnight prior to your procedure.   4.   On the morning of your procedure do NOT take any medication. Do not miss any doses of your blood thinner prior to the morning of your procedure  or your procedure will need to be rescheduled.   5.  Plan for an overnight stay but you may be discharged after your procedure, if you use your phone frequently bring your phone charger. If you are discharged after your procedure you will need someone to drive you home and be with you for 24 hours after your procedure.   6. You will follow up with the AFIB clinic 4 weeks after your procedure.  You will follow up with Dr. Curt Bears  3 months after your procedure.  These appointments will be made for you.   7. FYI: For your safety, and to allow [place] to monitor your vital signs accurately during the surgery/procedure we request that if you have artificial nails, gel coating, SNS etc. Please have those removed prior to your surgery/procedure. Not having the nail coverings /polish removed may result in cancellation or delay of your surgery/procedure.  * If you have ANY questions please call the office [phone] and ask for Flynt Breeze RN or send me a MyChart message   *  Occasionally, EP Studies and ablations can become lengthy.  Please make your family aware of this before your procedure starts.  Average time ranges from 2-8 hours for EP studies/ablations.  Your physician will call your family after the procedure with the results.                                    Cardiac Ablation Cardiac ablation is a procedure to destroy (ablate) some heart tissue that is sending bad signals. These bad signals causeproblems in heart rhythm. The heart has many areas that make these signals. If there are problems in these areas, they can make the heart beat in a way that is not normal.Destroying some tissues can help make the heart rhythm normal. Tell your doctor about: Any allergies you have. All medicines you are taking. These include vitamins, herbs, eye drops, creams, and over-the-counter medicines. Any problems you or family members have had with medicines that make you fall asleep (anesthetics). Any blood  disorders you have. Any surgeries you have had. Any medical conditions you have, such as kidney failure. Whether you are pregnant or may be pregnant. What are the risks? This is a safe procedure. But problems may occur, including: Infection. Bruising and bleeding. Bleeding into the chest. Stroke or blood clots. Damage to nearby areas of your body. Allergies to medicines or dyes. The need for a pacemaker if the normal system is damaged. Failure of the procedure to treat the problem. What happens before the procedure? Medicines Ask your doctor about: Changing or stopping your normal medicines. This is important. Taking aspirin and ibuprofen. Do not take these medicines unless your doctor tells you to take them. Taking other medicines, vitamins, herbs, and supplements. General instructions Follow instructions from your doctor about what you cannot eat or drink. Plan to have someone take you home from the hospital or clinic. If you will be going home right after the procedure, plan to have someone with you for 24 hours. Ask your doctor what steps will be taken to prevent infection. What happens during the procedure?  An IV tube will be put into one of your veins. You will be given a medicine to help you relax. The skin on your neck or groin will be numbed. A cut (incision) will be made in your neck or groin. A needle will be put through your cut and into a large vein. A tube (catheter) will be put into the needle. The tube will be moved to your heart. Dye may be put through the tube. This helps your doctor see your heart. Small devices (electrodes) on the tube will send out signals. A type of energy will be used to destroy some heart tissue. The tube will be taken out. Pressure will be held on your cut. This helps stop bleeding. A bandage will be put over your cut. The exact procedure may vary among doctors and hospitals. What happens after the procedure? You will be watched until  you leave the hospital or clinic. This includes checking your heart rate, breathing rate, oxygen, and blood pressure. Your cut will be watched for bleeding. You will need to lie still for a few hours. Do not drive for 24 hours or as long as your doctor tells you. Summary Cardiac ablation is a procedure to destroy some heart tissue. This is done to treat heart rhythm problems. Tell your doctor about any medical conditions you may  have. Tell him or her about all medicines you are taking to treat them. This is a safe procedure. But problems may occur. These include infection, bruising, bleeding, and damage to nearby areas of your body. Follow what your doctor tells you about food and drink. You may also be told to change or stop some of your medicines. After the procedure, do not drive for 24 hours or as long as your doctor tells you. This information is not intended to replace advice given to you by your health care provider. Make sure you discuss any questions you have with your healthcare provider. Document Revised: 10/15/2019 Document Reviewed: 10/15/2019 Elsevier Patient Education  2022 [employer].

## 2021-07-24 ENCOUNTER — Ambulatory Visit: Payer: PPO

## 2021-08-04 ENCOUNTER — Telehealth: Payer: Self-pay | Admitting: Cardiology

## 2021-08-04 NOTE — Telephone Encounter (Signed)
Pt aware that her procedure is still held for 10/18 and that I will schedule it at hospital in next few weeks. Advised her to call insurance to discuss if they will cover procedure, but made aware our office does send precert to billing on procedures.  But further explained she will need to follow up with insurance to confirm coverage. Patient verbalized understanding and agreeable to plan.

## 2021-08-04 NOTE — Telephone Encounter (Signed)
Teresa Blair is calling requesting to speak with Sherri she is wanting to confirm that everything is confirmed with his insurance in regards to his ablation.

## 2021-08-15 ENCOUNTER — Telehealth: Payer: Self-pay

## 2021-08-15 NOTE — Telephone Encounter (Signed)
Prior Auth for Repatha submitted. Determination is pending.

## 2021-08-16 NOTE — Telephone Encounter (Signed)
Prior Auth for Repatha approved. Confirmation letter processed to scan

## 2021-08-17 ENCOUNTER — Ambulatory Visit: Payer: PPO | Admitting: Legal Medicine

## 2021-08-23 ENCOUNTER — Encounter: Payer: Self-pay | Admitting: Legal Medicine

## 2021-08-23 ENCOUNTER — Other Ambulatory Visit: Payer: Self-pay

## 2021-08-23 ENCOUNTER — Ambulatory Visit (INDEPENDENT_AMBULATORY_CARE_PROVIDER_SITE_OTHER): Payer: PPO | Admitting: Legal Medicine

## 2021-08-23 VITALS — BP 110/82 | HR 60 | Temp 97.5°F | Ht 64.0 in | Wt 243.4 lb

## 2021-08-23 DIAGNOSIS — G4733 Obstructive sleep apnea (adult) (pediatric): Secondary | ICD-10-CM | POA: Diagnosis not present

## 2021-08-23 DIAGNOSIS — E782 Mixed hyperlipidemia: Secondary | ICD-10-CM

## 2021-08-23 DIAGNOSIS — I4819 Other persistent atrial fibrillation: Secondary | ICD-10-CM

## 2021-08-23 DIAGNOSIS — I10 Essential (primary) hypertension: Secondary | ICD-10-CM | POA: Diagnosis not present

## 2021-08-23 DIAGNOSIS — E66813 Obesity, class 3: Secondary | ICD-10-CM

## 2021-08-23 DIAGNOSIS — Z23 Encounter for immunization: Secondary | ICD-10-CM | POA: Diagnosis not present

## 2021-08-23 DIAGNOSIS — D6869 Other thrombophilia: Secondary | ICD-10-CM

## 2021-08-23 DIAGNOSIS — Z6841 Body Mass Index (BMI) 40.0 and over, adult: Secondary | ICD-10-CM

## 2021-08-23 HISTORY — DX: Morbid (severe) obesity due to excess calories: E66.01

## 2021-08-23 HISTORY — DX: Obesity, class 3: E66.813

## 2021-08-23 HISTORY — DX: Other thrombophilia: D68.69

## 2021-08-23 NOTE — Progress Notes (Signed)
Established Patient Office Visit  Subjective:  Patient ID: Teresa Blair, female    DOB: 1951-08-25  Age: 70 y.o. MRN: [id number]  CC:  Chief Complaint  Patient presents with   Hypertension   Hyperlipidemia    HPI VEEDA VIRGO presents for chronic visit.  Patient presents for follow up of hypertension.  Patient tolerating metoprolol well with side effects.  Patient was diagnosed with hypertension 2010 so has been treated for hypertension for 10 years.Patient is working on maintaining diet and exercise regimen and follows up as directed. Complication include none.   Patient presents with hyperlipidemia.  Compliance with treatment has been good; patient takes medicines as directed, maintains low cholesterol diet, follows up as directed, and maintains exercise regimen.  Patient is using repatha without problems.   Patient has a diagnosis of chronic atrial fibrillation.   Patient is on eliquis and has controlled ventricular response.  Patient is CV s table.  Past Medical History:  Diagnosis Date   A-fib [facility])    Anxiety 12/01/2020   Asthma    as a child   Atrial fibrillation ([place]) 09/16/2020   Chronic anticoagulation 12/01/2020   Closed fracture of right acetabulum ([place]) 09/03/2011   Enthesopathy of hip region 09/03/2011   Gestational diabetes    Headache    History of CVA (cerebrovascular accident) 09/16/2020   Hypercholesterolemia    Hypertension    Kidney stones    Mixed hyperlipidemia 09/16/2020   Morbid obesity ([place]) 09/16/2020   Obesity    Osteoarthritis    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   Primary hypertension 09/16/2020   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   Right hip pain 09/03/2011   Scleroderma, localized    Sequelae, 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   Stroke ([place]) 09/12/2020   left side weakness/speech affected   Wears glasses     Past Surgical History:  Procedure Laterality Date   BUBBLE STUDY  05/04/2021   Procedure:  BUBBLE STUDY;  Surgeon: Elouise Munroe, MD;  Location: [place];  Service: Cardiovascular;;   CARDIOVERSION N/A 05/04/2021   Procedure: CARDIOVERSION;  Surgeon: Elouise Munroe, MD;  Location[facility] ENDOSCOPY;  Service: Cardiovascular;  Laterality: N/A;   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 06/08/2015   Procedure: CONVERSION TO TOTAL HIP, ANTERIOR TOTAL HIP;  Surgeon: Kathryne Hitch, MD;  Location: [place];  Service: Orthopedics;  Laterality: Right;   FRACTURE SURGERY     right hip   TEE WITHOUT CARDIOVERSION N/A 05/04/2021   Procedure: TRANSESOPHAGEAL ECHOCARDIOGRAM (TEE);  Surgeon: Elouise Munroe, MD;  Location: [facility] ENDOSCOPY;  Service: Cardiovascular;  Laterality: N/A;   TOTAL HIP ARTHROPLASTY Right 06/08/2015   TUBAL LIGATION     WISDOM TOOTH EXTRACTION      Family History  Problem Relation Age of Onset   Cancer Mother    Cancer Father    Hypertension Other     Social History   Socioeconomic History   Marital status: Single    Spouse name: Not on file   Number of children: 3   Years of education: Not on file   Highest education level: Not on file  Occupational History    Comment: full time  Tobacco Use   Smoking status: Never   Smokeless tobacco: Never  Vaping Use   Vaping Use: Never used  Substance and Sexual Activity   Alcohol use: No   Drug use: No   Sexual activity: Not Currently  Other Topics Concern   Not on  file  Social History Narrative   Lives alone   Right Handed   Drinks no caffeine   Social Determinants of Health   Financial Resource Strain: Not on file  Food Insecurity: Not on file  Transportation Needs: Not on file  Physical Activity: Not on file  Stress: Not on file  Social Connections: Not on file  Intimate Partner Violence: Not on file    Outpatient Medications Prior to Visit  Medication Sig Dispense Refill   ELIQUIS 5 MG TABS tablet Take 1 tablet (5 mg total) by mouth 2 (two) times daily. 60 tablet 12   Evolocumab (REPATHA SURECLICK) 371  MG/ML SOAJ Inject 140 mg into the skin every 14 (fourteen) days. 2 mL 11   LORazepam (ATIVAN) 0.5 MG tablet Take 1 tablet (0.5 mg total) by mouth 2 (two) times daily as needed for anxiety (try with 1/2 pill first). 30 tablet 3   metoprolol succinate (TOPROL-XL) 25 MG 24 hr tablet Take one tablet by mouth daily daily in the morning and 0.5 tablet by mouth daily in the evening. (Patient taking differently: Take 12.5-25 mg by mouth See admin instructions. Take one tablet by mouth daily daily in the morning and 0.5 tablet by mouth daily in the evening.) 135 tablet 3   No facility-administered medications prior to visit.    Allergies  Allergen Reactions   Perflutren Shortness Of Breath    Protein contrast (pain radiating from feet through spine)   Atorvastatin Other (See Comments)    Joint pain, aches   Other Hives and Swelling    Poppy seeds   Pravastatin Other (See Comments)    Joint pain, aches   Sulfa Antibiotics Hives   Sulfamethoxazole Rash    ROS Review of Systems  Constitutional:  Positive for fatigue. Negative for activity change and appetite change.  HENT:  Negative for congestion.   Eyes:  Negative for visual disturbance.  Respiratory:  Negative for chest tightness and shortness of breath.   Cardiovascular:  Negative for chest pain, palpitations and leg swelling.  Gastrointestinal:  Negative for abdominal distention and abdominal pain.  Genitourinary:  Negative for difficulty urinating and dysuria.  Musculoskeletal:  Negative for arthralgias and back pain.  Neurological: Negative.      Objective:    Physical Exam Vitals reviewed.  Constitutional:      Appearance: Normal appearance. She is obese.  HENT:     Head: Normocephalic.     Right Ear: Tympanic membrane, ear canal and external ear normal.     Left Ear: Tympanic membrane, ear canal and external ear normal.     Mouth/Throat:     Mouth: Mucous membranes are moist.     Pharynx: Oropharynx is clear.  Eyes:      Extraocular Movements: Extraocular movements intact.     Conjunctiva/sclera: Conjunctivae normal.     Pupils: Pupils are equal, round, and reactive to light.  Cardiovascular:     Rate and Rhythm: Normal rate. Rhythm irregular.     Pulses: Normal pulses.     Heart sounds: Normal heart sounds. No murmur heard.   No gallop.  Pulmonary:     Effort: Pulmonary effort is normal. No respiratory distress.     Breath sounds: Normal breath sounds. No wheezing.  Abdominal:     General: Abdomen is flat. Bowel sounds are normal. There is no distension.     Palpations: Abdomen is soft.     Tenderness: There is no abdominal tenderness.  Musculoskeletal:  General: Normal range of motion.     Cervical back: Normal range of motion.     Right lower leg: No edema.     Left lower leg: No edema.  Skin:    General: Skin is warm.     Capillary Refill: Capillary refill takes less than 2 seconds.  Neurological:     General: No focal deficit present.     Mental Status: She is alert and oriented to person, place, and time. Mental status is at baseline.    BP 110/82   Pulse 60   Temp (!) 97.5 F (36.4 C)   Ht $R'5\' 4"'sb$  (1.626 m)   Wt 243 lb 6.4 oz (110.4 kg)   SpO2 96%   BMI 41.78 kg/m  Wt Readings from Last 3 Encounters:  08/23/21 243 lb 6.4 oz (110.4 kg)  07/17/21 254 lb 12.8 oz (115.6 kg)  07/04/21 253 lb 3.2 oz (114.9 kg)     Health Maintenance Due  Topic Date Due   Hepatitis C Screening  Never done   Zoster Vaccines- Shingrix (1 of 2) Never done   COLONOSCOPY (Pts 45-67yrs Insurance coverage will need to be confirmed)  Never done   MAMMOGRAM  Never done   DEXA SCAN  Never done   COVID-19 Vaccine (3 - Pfizer risk series) 08/05/2020    There are no preventive care reminders to display for this patient.  No results found for: TSH Lab Results  Component Value Date   WBC 11.3 (H) 04/26/2021   HGB 14.9 04/26/2021   HCT 44.9 04/26/2021   MCV 86 04/26/2021   PLT 266 04/26/2021   Lab  Results  Component Value Date   NA 140 04/26/2021   K 4.9 04/26/2021   CO2 21 04/26/2021   GLUCOSE 121 (H) 04/26/2021   BUN 11 04/26/2021   CREATININE 0.68 04/26/2021   BILITOT 0.4 07/13/2021   ALKPHOS 70 07/13/2021   AST 14 07/13/2021   ALT 17 07/13/2021   PROT 6.4 07/13/2021   ALBUMIN 4.3 07/13/2021   CALCIUM 9.8 04/26/2021   ANIONGAP 4 (L) 06/10/2015   EGFR 95 04/26/2021   Lab Results  Component Value Date   CHOL 119 07/13/2021   Lab Results  Component Value Date   HDL 54 07/13/2021   Lab Results  Component Value Date   LDLCALC 46 07/13/2021   Lab Results  Component Value Date   TRIG 103 07/13/2021   Lab Results  Component Value Date   CHOLHDL 2.2 07/13/2021   No results found for: HGBA1C    Assessment & Plan:   Problem List Items Addressed This Visit       Cardiovascular and Mediastinum   Atrial fibrillation [facility]) Patient has a diagnosis of chronic atrial fibrillation.   Patient is on eliquis and has controlled ventricular response.  Patient is CV stable .    Hypertension - Primary   Relevant Orders   CBC with Differential   Comprehensive metabolic panel An individual hypertension care plan was established and reinforced today.  The patient's status was assessed using clinical findings on exam and labs or diagnostic tests. The patient's success at meeting treatment goals on disease specific evidence-based guidelines and found to be well controlled. SELF MANAGEMENT: The patient and I together assessed ways to personally work towards obtaining the recommended goals. RECOMMENDATIONS: avoid decongestants found in common cold remedies, decrease consumption of alcohol, perform routine monitoring of BP with home BP cuff, exercise, reduction of dietary salt, take medicines as  prescribed, try not to miss doses and quit smoking.  Regular exercise and maintaining a healthy weight is needed.  Stress reduction may help. A CLINICAL SUMMARY including written plan  identify barriers to care unique to individual due to social or financial issues.  We attempt to mutually creat solutions for individual and family understanding.      Respiratory   OSA (obstructive sleep apnea) She has not yet received CPAP     Hematopoietic and Hemostatic   Acquired thrombophilia ([place]) Patient is on eliquis for atrial fibrillation     Other   Mixed hyperlipidemia   Relevant Orders   Lipid panel AN INDIVIDUAL CARE PLAN for hyperlipidemia/ cholesterol was established and reinforced today.  The patient's status was assessed using clinical findings on exam, lab and other diagnostic tests. The patient's disease status was assessed based on evidence-based guidelines and found to be fair controlled. MEDICATIONS were reviewed. SELF MANAGEMENT GOALS have been discussed and patient's success at attaining the goal of low cholesterol was assessed. RECOMMENDATION given include regular exercise 3 days a week and low cholesterol/low fat diet. CLINICAL SUMMARY including written plan to identify barriers unique to the patient due to social or economic  reasons was discussed.     Morbid obesity ([place]) An individualize plan was formulated for obesity using patient history and physical exam to encourage weight loss.  An evidence based program was formulated.  Patient is to cut portion size with meals and to plan physical exercise 3 days a week at least 20 minutes.  Weight watchers and other programs are helpful.  Planned amount of weight loss 10 lbs.     BMI 40.0-44.9, adult [facility]) An individualize plan was formulated for obesity using patient history and physical exam to encourage weight loss.  An evidence based program was formulated.  Patient is to cut portion size with meals and to plan physical exercise 3 days a week at least 20 minutes.  Weight watchers and other programs are helpful.  Planned amount of weight loss 10 lbs.    Other Visit Diagnoses     Need for vaccination        Relevant Orders   Flu Vaccine QUAD High Dose(Fluad) (Completed)       No orders of the defined types were placed in this encounter.   Follow-up: No follow-ups on file.    Reinaldo Meeker, MD

## 2021-08-24 LAB — COMPREHENSIVE METABOLIC PANEL
ALT: 17 IU/L (ref 0–32)
AST: 15 IU/L (ref 0–40)
Albumin/Globulin Ratio: 1.9 (ref 1.2–2.2)
Albumin: 4.8 g/dL (ref 3.8–4.8)
Alkaline Phosphatase: 87 IU/L (ref 44–121)
BUN/Creatinine Ratio: 17 (ref 12–28)
BUN: 12 mg/dL (ref 8–27)
Bilirubin Total: 0.5 mg/dL (ref 0.0–1.2)
CO2: 24 mmol/L (ref 20–29)
Calcium: 10.1 mg/dL (ref 8.7–10.3)
Chloride: 105 mmol/L (ref 96–106)
Creatinine, Ser: 0.71 mg/dL (ref 0.57–1.00)
Globulin, Total: 2.5 g/dL (ref 1.5–4.5)
Glucose: 99 mg/dL (ref 70–99)
Potassium: 4.6 mmol/L (ref 3.5–5.2)
Sodium: 144 mmol/L (ref 134–144)
Total Protein: 7.3 g/dL (ref 6.0–8.5)
eGFR: 93 mL/min/{1.73_m2} (ref >59)

## 2021-08-24 LAB — CBC WITH DIFFERENTIAL/PLATELET
Basophils Absolute: 0 10*3/uL (ref 0.0–0.2)
Basos: 1 %
EOS (ABSOLUTE): 0.1 10*3/uL (ref 0.0–0.4)
Eos: 2 %
Hematocrit: 45.3 % (ref 34.0–46.6)
Hemoglobin: 15.1 g/dL (ref 11.1–15.9)
Immature Grans (Abs): 0 10*3/uL (ref 0.0–0.1)
Immature Granulocytes: 0 %
Lymphocytes Absolute: 1.5 10*3/uL (ref 0.7–3.1)
Lymphs: 24 %
MCH: 28.5 pg (ref 26.6–33.0)
MCHC: 33.3 g/dL (ref 31.5–35.7)
MCV: 86 fL (ref 79–97)
Monocytes Absolute: 0.5 10*3/uL (ref 0.1–0.9)
Monocytes: 9 %
Neutrophils Absolute: 3.9 10*3/uL (ref 1.4–7.0)
Neutrophils: 64 %
Platelets: 220 10*3/uL (ref 150–450)
RBC: 5.29 x10E6/uL — ABNORMAL HIGH (ref 3.77–5.28)
RDW: 11.8 % (ref 11.7–15.4)
WBC: 6 10*3/uL (ref 3.4–10.8)

## 2021-08-24 LAB — LIPID PANEL
Chol/HDL Ratio: 2.4 ratio (ref 0.0–4.4)
Cholesterol, Total: 121 mg/dL (ref 100–199)
HDL: 51 mg/dL (ref >39)
LDL Chol Calc (NIH): 50 mg/dL (ref 0–99)
Triglycerides: 108 mg/dL (ref 0–149)
VLDL Cholesterol Cal: 20 mg/dL (ref 5–40)

## 2021-08-24 LAB — CARDIOVASCULAR RISK ASSESSMENT

## 2021-08-29 ENCOUNTER — Other Ambulatory Visit: Payer: Self-pay

## 2021-08-29 ENCOUNTER — Encounter (HOSPITAL_BASED_OUTPATIENT_CLINIC_OR_DEPARTMENT_OTHER): Payer: PPO | Admitting: Cardiovascular Disease

## 2021-08-29 ENCOUNTER — Ambulatory Visit (HOSPITAL_BASED_OUTPATIENT_CLINIC_OR_DEPARTMENT_OTHER): Payer: PPO | Attending: Cardiovascular Disease | Admitting: Cardiovascular Disease

## 2021-08-29 DIAGNOSIS — G4733 Obstructive sleep apnea (adult) (pediatric): Secondary | ICD-10-CM | POA: Diagnosis not present

## 2021-09-01 ENCOUNTER — Telehealth (HOSPITAL_COMMUNITY): Payer: Self-pay | Admitting: *Deleted

## 2021-09-01 NOTE — Telephone Encounter (Signed)
Attempted to call patient regarding upcoming cardiac CT appointment. °Left message on voicemail with name and callback number ° °Shyah Cadmus RN Navigator Cardiac Imaging °[facility] °[phone] Office °[phone] Cell ° °

## 2021-09-04 ENCOUNTER — Telehealth: Payer: Self-pay | Admitting: Cardiology

## 2021-09-04 NOTE — Telephone Encounter (Signed)
Called patient, she has CT scan scheduled for tomorrow at 1:00- she states that this is for her ablation she is to have in 1 week. Her PCP will be doing a COVID/STREP test on her tomorrow at 8:30 AM- but she is not sure what to do about the CT scan that is scheduled. I advised to get tested, and I would send a message to [place] to make her aware- and get her recommendations for this.   Patient verbalized understanding, thankful for call back.

## 2021-09-04 NOTE — Telephone Encounter (Signed)
PT WOKE UP THIS MORNING WITH A SORE THROAT, PT IS SUPPOSED TO BE HAVING A CT SCAN ON 09/05/21 AND AN ABLASION 1 WEEK FROM TOMORROW. PT PCP ADVISED PT TO COME TO THE OFFICE IN THE MORNING FOR A COVID TEST, PT WANTS TO KNOW WHAT SHE SHOULD DO? PLEASE ADVISE PT FURTHER

## 2021-09-05 ENCOUNTER — Ambulatory Visit (INDEPENDENT_AMBULATORY_CARE_PROVIDER_SITE_OTHER): Payer: PPO | Admitting: Nurse Practitioner

## 2021-09-05 ENCOUNTER — Encounter: Payer: Self-pay | Admitting: Nurse Practitioner

## 2021-09-05 ENCOUNTER — Ambulatory Visit (HOSPITAL_COMMUNITY): Admission: RE | Admit: 2021-09-05 | Payer: PPO | Source: Ambulatory Visit

## 2021-09-05 VITALS — BP 124/68 | HR 92 | Temp 97.6°F | Ht 65.0 in | Wt 240.0 lb

## 2021-09-05 DIAGNOSIS — J018 Other acute sinusitis: Secondary | ICD-10-CM | POA: Diagnosis not present

## 2021-09-05 DIAGNOSIS — R051 Acute cough: Secondary | ICD-10-CM

## 2021-09-05 DIAGNOSIS — H6503 Acute serous otitis media, bilateral: Secondary | ICD-10-CM

## 2021-09-05 DIAGNOSIS — H6983 Other specified disorders of Eustachian tube, bilateral: Secondary | ICD-10-CM | POA: Diagnosis not present

## 2021-09-05 LAB — POC COVID19 BINAXNOW: SARS Coronavirus 2 Ag: NEGATIVE

## 2021-09-05 LAB — POCT INFLUENZA A/B
Influenza A, POC: NEGATIVE
Influenza B, POC: NEGATIVE

## 2021-09-05 LAB — POCT RAPID STREP A (OFFICE): Rapid Strep A Screen: NEGATIVE

## 2021-09-05 MED ORDER — PROMETHAZINE-DM 6.25-15 MG/5ML PO SYRP: 5.0000 mL | ORAL_SOLUTION | Freq: Four times a day (QID) | ORAL | 1 refills | Status: DC | PRN

## 2021-09-05 MED ORDER — TRIAMCINOLONE ACETONIDE 40 MG/ML IJ SUSP: 60.0000 mg | Freq: Once | INTRAMUSCULAR | Status: DC

## 2021-09-05 MED ORDER — AZITHROMYCIN 250 MG PO TABS: ORAL_TABLET | ORAL | 0 refills | Status: AC

## 2021-09-05 NOTE — Patient Instructions (Addendum)
Take Z-pack as directed Use cough syrup up to four times daily as needed Continue Dayquil/Nyquil as needed Warm salt water gargles and throat lozenges as needed Kenalog injection given in office Follow-up as needed  Sinusitis, Adult Sinusitis is soreness and swelling (inflammation) of your sinuses. Sinuses are hollow spaces in the bones around your face. They are located: Around your eyes. In the middle of your forehead. Behind your nose. In your cheekbones. Your sinuses and nasal passages are lined with a fluid called mucus. Mucus drains out of your sinuses. Swelling can trap mucus in your sinuses. This lets germs (bacteria, virus, or fungus) grow, which leads to infection. Most of the time, this condition is caused by a virus. What are the causes? This condition is caused by: Allergies. Asthma. Germs. Things that block your nose or sinuses. Growths in the nose (nasal polyps). Chemicals or irritants in the air. Fungus (rare). What increases the risk? You are more likely to develop this condition if: You have a weak body defense system (immune system). You do a lot of swimming or diving. You use nasal sprays too much. You smoke. What are the signs or symptoms? The main symptoms of this condition are pain and a feeling of pressure around the sinuses. Other symptoms include: Stuffy nose (congestion). Runny nose (drainage). Swelling and warmth in the sinuses. Headache. Toothache. A cough that may get worse at night. Mucus that collects in the throat or the back of the nose (postnasal drip). Being unable to smell and taste. Being very tired (fatigue). A fever. Sore throat. Bad breath. How is this diagnosed? This condition is diagnosed based on: Your symptoms. Your medical history. A physical exam. Tests to find out if your condition is short-term (acute) or long-term (chronic). Your doctor may: Check your nose for growths (polyps). Check your sinuses using a tool that  has a light (endoscope). Check for allergies or germs. Do imaging tests, such as an MRI or CT scan. How is this treated? Treatment for this condition depends on the cause and whether it is short-term or long-term. If caused by a virus, your symptoms should go away on their own within 10 days. You may be given medicines to relieve symptoms. They include: Medicines that shrink swollen tissue in the nose. Medicines that treat allergies (antihistamines). A spray that treats swelling of the nostrils.  Rinses that help get rid of thick mucus in your nose (nasal saline washes). If caused by bacteria, your doctor may wait to see if you will get better without treatment. You may be given antibiotic medicine if you have: A very bad infection. A weak body defense system. If caused by growths in the nose, you may need to have surgery. Follow these instructions at home: Medicines Take, use, or apply over-the-counter and prescription medicines only as told by your doctor. These may include nasal sprays. If you were prescribed an antibiotic medicine, take it as told by your doctor. Do not stop taking the antibiotic even if you start to feel better. Hydrate and humidify  Drink enough water to keep your pee (urine) pale yellow. Use a cool mist humidifier to keep the humidity level in your home above 50%. Breathe in steam for 10-15 minutes, 3-4 times a day, or as told by your doctor. You can do this in the bathroom while a hot shower is running. Try not to spend time in cool or dry air. Rest Rest as much as you can. Sleep with your head raised (elevated).  Make sure you get enough sleep each night. General instructions  Put a warm, moist washcloth on your face 3-4 times a day, or as often as told by your doctor. This will help with discomfort. Wash your hands often with soap and water. If there is no soap and water, use hand sanitizer. Do not smoke. Avoid being around people who are smoking (secondhand  smoke). Keep all follow-up visits as told by your doctor. This is important. Contact a doctor if: You have a fever. Your symptoms get worse. Your symptoms do not get better within 10 days. Get help right away if: You have a very bad headache. You cannot stop throwing up (vomiting). You have very bad pain or swelling around your face or eyes. You have trouble seeing. You feel confused. Your neck is stiff. You have trouble breathing. Summary Sinusitis is swelling of your sinuses. Sinuses are hollow spaces in the bones around your face. This condition is caused by tissues in your nose that become inflamed or swollen. This traps germs. These can lead to infection. If you were prescribed an antibiotic medicine, take it as told by your doctor. Do not stop taking it even if you start to feel better. Keep all follow-up visits as told by your doctor. This is important. This information is not intended to replace advice given to you by your health care provider. Make sure you discuss any questions you have with your health care provider. Document Revised: 04/14/2018 Document Reviewed: 04/14/2018 Elsevier Patient Education  2022 [place].   Otitis Media, Adult Otitis media is a condition in which the middle ear is red and swollen (inflamed) and full of fluid. The middle ear is the part of the ear that contains bones for hearing as well as air that helps send sounds to the brain. The condition usually goes away on its own. What are the causes? This condition is caused by a blockage in the eustachian tube. This tube connects the middle ear to the back of the nose. It normally allows air into the middle ear. The blockage is caused by fluid or swelling. Problems that can cause blockage include: A cold or infection that affects the nose, mouth, or throat. Allergies. An irritant, such as tobacco smoke. Adenoids that have become large. The adenoids are soft tissue located in the back of the throat,  behind the nose and the roof of the mouth. Growth or swelling in the upper part of the throat, just behind the nose (nasopharynx). Damage to the ear caused by a change in pressure. This is called barotrauma. What increases the risk? You are more likely to develop this condition if you: Smoke or are exposed to tobacco smoke. Have an opening in the roof of your mouth (cleft palate). Have acid reflux. Have problems in your body's defense system (immune system). What are the signs or symptoms? Symptoms of this condition include: Ear pain. Fever. Problems with hearing. Being tired. Fluid leaking from the ear. Ringing in the ear. How is this treated? This condition can go away on its own within 3-5 days. But if the condition is caused by germs (bacteria) and does not go away on its own, or if it keeps coming back, your doctor may: Give you antibiotic medicines. Give you medicines for pain. Follow these instructions at home: Take over-the-counter and prescription medicines only as told by your doctor. If you were prescribed an antibiotic medicine, take it as told by your doctor. Do not stop taking it  even if you start to feel better. Keep all follow-up visits. Contact a doctor if: You have bleeding from your nose. There is a lump on your neck. You are not feeling better in 5 days. You feel worse instead of better. Get help right away if: You have pain that is not helped with medicine. You have swelling, redness, or pain around your ear. You get a stiff neck. You cannot move part of your face (paralysis). You notice that the bone behind your ear hurts when you touch it. You get a very bad headache. Summary Otitis media means that the middle ear is red, swollen, and full of fluid. This condition usually goes away on its own. If the problem does not go away, treatment may be needed. You may be given medicines to treat the infection or to treat your pain. If you were prescribed an  antibiotic medicine, take it as told by your doctor. Do not stop taking it even if you start to feel better. Keep all follow-up visits. This information is not intended to replace advice given to you by your health care provider. Make sure you discuss any questions you have with your health care provider. Document Revised: 02/20/2021 Document Reviewed: 02/20/2021 Elsevier Patient Education  [place].

## 2021-09-05 NOTE — Telephone Encounter (Signed)
Followed up with patient who reports she is not feeling well.  She is currently sitting a doctors office to be evaluated and tested for Covid/other illnesses. She is congested, stuffy, cold chills and doesn't feel well. Pt aware I will reach out to CT team and see if can get her rescheduled for Friday CT. Advised to keep me updated on status d/t scheduled ablation next week. Patient verbalized understanding and agreeable to plan.

## 2021-09-05 NOTE — Telephone Encounter (Signed)
Left detailed message asking pt to let us know what results of Covid/testing is. Aware CT is holding a spot for Friday but will need to ensure covid test comes back negative. Asked pt to update [place] once she hears back.

## 2021-09-05 NOTE — Progress Notes (Signed)
Acute Office Visit  Subjective:    Patient ID: Teresa Blair, female    DOB: 04-24-1951, 70 y.o.   MRN: [id number]  Chief Complaint  Patient presents with   URI    HPI Patient is in today for sinus congestion, post-nasal-drip, fever, bilateral ear pain, sore throat and cough. Onset of symptoms was 3-days-ago. Treatment has included Dayquil/Nyquil. 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 Home covid test was negative, in house covid/flu, and strep test are negative. She tells me she is scheduled to undergo a cardiac ablation for chronic a-fib next week.    Past Medical History:  Diagnosis Date   A-fib [facility])    Anxiety 12/01/2020   Asthma    as a child   Atrial fibrillation ([place]) 09/16/2020   Chronic anticoagulation 12/01/2020   Closed fracture of right acetabulum ([place]) 09/03/2011   Enthesopathy of hip region 09/03/2011   Gestational diabetes    Headache    History of CVA (cerebrovascular accident) 09/16/2020   Hypercholesterolemia    Hypertension    Kidney stones    Mixed hyperlipidemia 09/16/2020   Morbid obesity ([place]) 09/16/2020   Obesity    Osteoarthritis    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   Primary hypertension 09/16/2020   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   Right hip pain 09/03/2011   Scleroderma, localized    Sequelae, 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   Stroke ([place]) 09/12/2020   left side weakness/speech affected   Wears glasses     Past Surgical History:  Procedure Laterality Date   BUBBLE STUDY  05/04/2021   Procedure: BUBBLE STUDY;  Surgeon: Elouise Munroe, MD;  Location: [facility];  Service: Cardiovascular;;   CARDIOVERSION N/A 05/04/2021   Procedure: CARDIOVERSION;  Surgeon: Elouise Munroe, MD;  Location: [facility] ENDOSCOPY;  Service: Cardiovascular;  Laterality: N/A;   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 06/08/2015   Procedure: CONVERSION TO TOTAL HIP, ANTERIOR TOTAL HIP;  Surgeon: Kathryne Hitch, MD;  Location: [facility];  Service: Orthopedics;  Laterality: Right;   FRACTURE SURGERY     right hip   TEE WITHOUT CARDIOVERSION N/A 05/04/2021   Procedure: TRANSESOPHAGEAL ECHOCARDIOGRAM (TEE);  Surgeon: Elouise Munroe, MD;  Location: [facility] ENDOSCOPY;  Service: Cardiovascular;  Laterality: N/A;   TOTAL HIP ARTHROPLASTY Right 06/08/2015   TUBAL LIGATION     WISDOM TOOTH EXTRACTION      Family History  Problem Relation Age of Onset   Cancer Mother    Cancer Father    Hypertension Other     Social History   Socioeconomic History   Marital status: Single    Spouse name: Not on file   Number of children: 3   Years of education: Not on file   Highest education level: Not on file  Occupational History    Comment: full time  Tobacco Use   Smoking status: Never   Smokeless tobacco: Never  Vaping Use   Vaping Use: Never used  Substance and Sexual Activity   Alcohol use: No   Drug use: No   Sexual activity: Not Currently  Other Topics Concern   Not on file  Social History Narrative   Lives alone   Right Handed   Drinks no caffeine   Social Determinants of Health   Financial Resource Strain: Not on file  Food Insecurity: Not on file  Transportation Needs: Not on file  Physical Activity: Not on file  Stress: Not  on file  Social Connections: Not on file  Intimate Partner Violence: Not on file    Outpatient Medications Prior to Visit  Medication Sig Dispense Refill   ELIQUIS 5 MG TABS tablet Take 1 tablet (5 mg total) by mouth 2 (two) times daily. 60 tablet 12   Evolocumab (REPATHA SURECLICK) 665 MG/ML SOAJ Inject 140 mg into the skin every 14 (fourteen) days. 2 mL 11   LORazepam (ATIVAN) 0.5 MG tablet Take 1 tablet (0.5 mg total) by mouth 2 (two) times daily as needed for anxiety (try with 1/2 pill first). 30 tablet 3   metoprolol succinate (TOPROL-XL) 25 MG 24 hr tablet Take one tablet by mouth daily daily in the morning and 0.5 tablet by mouth daily in the evening.  (Patient taking differently: Take 12.5-25 mg by mouth See admin instructions. Take one tablet by mouth daily daily in the morning and 0.5 tablet by mouth daily in the evening.) 135 tablet 3   No facility-administered medications prior to visit.    Allergies  Allergen Reactions   Perflutren Shortness Of Breath    Protein contrast (pain radiating from feet through spine)   Atorvastatin Other (See Comments)    Joint pain, aches   Other Hives and Swelling    Poppy seeds   Pravastatin Other (See Comments)    Joint pain, aches   Sulfa Antibiotics Hives   Sulfamethoxazole Rash    Review of Systems  Constitutional:  Positive for chills, fatigue and fever.  HENT:  Positive for congestion, ear pain (BL), postnasal drip, rhinorrhea, sinus pressure, sinus pain and sore throat.   Eyes:  Positive for pain (pressure behind bilateral eyes).  Respiratory:  Positive for cough. Negative for shortness of breath.   Cardiovascular:  Negative for chest pain.  Gastrointestinal:  Negative for abdominal pain, diarrhea and nausea.  Endocrine: Negative.   Genitourinary: Negative.   Musculoskeletal:        Body aches  Allergic/Immunologic: Negative.   Neurological:  Positive for headaches. Negative for dizziness.      Objective:    Physical Exam Vitals reviewed.  Constitutional:      Appearance: Normal appearance.  HENT:     Right Ear: Tenderness present. Tympanic membrane is erythematous.     Left Ear: Tenderness present. Tympanic membrane is erythematous.     Nose: Congestion and rhinorrhea present.     Right Turbinates: Swollen.     Left Turbinates: Swollen.     Right Sinus: Frontal sinus tenderness present.     Left Sinus: Frontal sinus tenderness present.     Mouth/Throat:     Pharynx: Posterior oropharyngeal erythema present.  Cardiovascular:     Rate and Rhythm: Normal rate. Rhythm irregular.     Pulses: Normal pulses.     Heart sounds: Normal heart sounds.  Pulmonary:     Effort:  Pulmonary effort is normal.     Breath sounds: Normal breath sounds.  Abdominal:     General: Bowel sounds are normal.     Palpations: Abdomen is soft.  Lymphadenopathy:     Cervical: Cervical adenopathy present.  Skin:    General: Skin is warm and dry.     Capillary Refill: Capillary refill takes less than 2 seconds.  Neurological:     General: No focal deficit present.     Mental Status: She is alert and oriented to person, place, and time.  Psychiatric:        Mood and Affect: Mood normal.  Behavior: Behavior normal.    Pulse 92   Temp 97.6 F (36.4 C)   Ht 5' 5" (1.651 m)   Wt 240 lb (108.9 kg)   SpO2 96%   BMI 39.94 kg/m   BP 124/68   Pulse 92   Temp 97.6 F (36.4 C)   Ht 5' 5" (1.651 m)   Wt 240 lb (108.9 kg)   SpO2 96%   BMI 39.94 kg/m   Wt Readings from Last 3 Encounters:  09/05/21 240 lb (108.9 kg)  08/23/21 243 lb 6.4 oz (110.4 kg)  07/17/21 254 lb 12.8 oz (115.6 kg)    Health Maintenance Due  Topic Date Due   Hepatitis C Screening  Never done   Zoster Vaccines- Shingrix (1 of 2) Never done   COLONOSCOPY (Pts 45-19yr Insurance coverage will need to be confirmed)  Never done   MAMMOGRAM  Never done   DEXA SCAN  Never done   COVID-19 Vaccine (3 - Pfizer risk series) 08/05/2020     Lab Results  Component Value Date   WBC 6.0 08/23/2021   HGB 15.1 08/23/2021   HCT 45.3 08/23/2021   MCV 86 08/23/2021   PLT 220 08/23/2021   Lab Results  Component Value Date   NA 144 08/23/2021   K 4.6 08/23/2021   CO2 24 08/23/2021   GLUCOSE 99 08/23/2021   BUN 12 08/23/2021   CREATININE 0.71 08/23/2021   BILITOT 0.5 08/23/2021   ALKPHOS 87 08/23/2021   AST 15 08/23/2021   ALT 17 08/23/2021   PROT 7.3 08/23/2021   ALBUMIN 4.8 08/23/2021   CALCIUM 10.1 08/23/2021   ANIONGAP 4 (L) 06/10/2015   EGFR 93 08/23/2021   Lab Results  Component Value Date   CHOL 121 08/23/2021   Lab Results  Component Value Date   HDL 51 08/23/2021   Lab Results   Component Value Date   LDLCALC 50 08/23/2021   Lab Results  Component Value Date   TRIG 108 08/23/2021   Lab Results  Component Value Date   CHOLHDL 2.4 08/23/2021   No results found for: HGBA1C     Assessment & Plan:    1. Eustachian tube dysfunction, bilateral - triamcinolone acetonide (KENALOG-40) injection 60 mg  2. Non-recurrent acute serous otitis media of both ears  3. Other acute sinusitis, recurrence not specified - azithromycin (ZITHROMAX) 250 MG tablet; Take 2 tablets on day 1, then 1 tablet daily on days 2 through 5  Dispense: 6 tablet; Refill: 0 - promethazine-dextromethorphan (PROMETHAZINE-DM) 6.25-15 MG/5ML syrup; Take 5 mLs by mouth 4 (four) times daily as needed.  Dispense: 118 mL; Refill: 1  4. Acute cough - promethazine-dextromethorphan (PROMETHAZINE-DM) 6.25-15 MG/5ML syrup; Take 5 mLs by mouth 4 (four) times daily as needed.  Dispense: 118 mL; Refill: 1 - POCT Influenza A/B-NEGATIVE - POC COVID-19 BinaxNow-NEGATIVE - POCT rapid strep A-NEGATIVE     Take Z-pack as directed Use cough syrup up to four times daily as needed Continue Dayquil/Nyquil as needed Warm salt water gargles and throat lozenges as needed Kenalog injection given in office Follow-up as needed  Follow-up: PRN  An After Visit Summary was printed and given to the patient.  I, SRip Harbour NP, have reviewed all documentation for this visit. The documentation on 09/05/21 for the exam, diagnosis, procedures, and orders are all accurate and complete.   SRip Harbour NP [facility]([phone]

## 2021-09-06 ENCOUNTER — Telehealth (HOSPITAL_COMMUNITY): Payer: Self-pay | Admitting: Emergency Medicine

## 2021-09-06 NOTE — Telephone Encounter (Signed)
Calling patient to review PVCTA instructions for appt on Friday 09/08/21. Pt states she feels terrible with double ear infection and on antibiotics for strep. States she will call back in the AM to let us know if she wants to proceed with scan on Friday or postpone.   Marchia Bond RN Navigator Cardiac Imaging [facility] [phone] Office  [phone] Cell

## 2021-09-06 NOTE — Telephone Encounter (Signed)
Spoke to pt who is feeling terrible and being treated for strep. He throat feels like razors. Rescheduling ablation to 11/22 Aware CT will also be rescheduled. Aware I will send new instructions via mychart. Pt very appreciative of the follow up and being able to r/s her procedure.  She is agreeable to plan.

## 2021-09-08 ENCOUNTER — Ambulatory Visit (HOSPITAL_COMMUNITY): Payer: PPO

## 2021-09-11 ENCOUNTER — Ambulatory Visit (INDEPENDENT_AMBULATORY_CARE_PROVIDER_SITE_OTHER): Payer: PPO | Admitting: Nurse Practitioner

## 2021-09-11 ENCOUNTER — Other Ambulatory Visit: Payer: Self-pay

## 2021-09-11 ENCOUNTER — Encounter: Payer: Self-pay | Admitting: Nurse Practitioner

## 2021-09-11 VITALS — BP 132/64 | HR 88 | Temp 95.7°F | Ht 65.0 in | Wt 242.0 lb

## 2021-09-11 DIAGNOSIS — Z780 Asymptomatic menopausal state: Secondary | ICD-10-CM

## 2021-09-11 DIAGNOSIS — Z1231 Encounter for screening mammogram for malignant neoplasm of breast: Secondary | ICD-10-CM | POA: Diagnosis not present

## 2021-09-11 DIAGNOSIS — H65112 Acute and subacute allergic otitis media (mucoid) (sanguinous) (serous), left ear: Secondary | ICD-10-CM

## 2021-09-11 DIAGNOSIS — H6992 Unspecified Eustachian tube disorder, left ear: Secondary | ICD-10-CM

## 2021-09-11 DIAGNOSIS — Z1211 Encounter for screening for malignant neoplasm of colon: Secondary | ICD-10-CM

## 2021-09-11 DIAGNOSIS — H6982 Other specified disorders of Eustachian tube, left ear: Secondary | ICD-10-CM | POA: Diagnosis not present

## 2021-09-11 MED ORDER — PREDNISONE 20 MG PO TABS: 20.0000 mg | ORAL_TABLET | Freq: Every day | ORAL | 0 refills | Status: DC

## 2021-09-11 MED ORDER — FLUTICASONE PROPIONATE 50 MCG/ACT NA SUSP
2.0000 | Freq: Every day | NASAL | 6 refills | Status: DC
Start: 2021-09-11 — End: 2021-11-01

## 2021-09-11 MED ORDER — AMOXICILLIN-POT CLAVULANATE 875-125 MG PO TABS: 1.0000 | ORAL_TABLET | Freq: Two times a day (BID) | ORAL | 0 refills | Status: DC

## 2021-09-11 NOTE — Patient Instructions (Addendum)
Take Augmentin twice daily for 10 days Take Prednisone as directed Use Flonase nasal spray daily Take Sudafed OTC as directed Take Tylenol/Ibuprofen as directed Follow-up as needed   Eustachian Tube Dysfunction Eustachian tube dysfunction refers to a condition in which a blockage develops in the narrow passage that connects the middle ear to the back of the nose (eustachian tube). The eustachian tube regulates air pressure in the middle ear by letting air move between the ear and nose. It also helps to drain fluid from the middle ear space. Eustachian tube dysfunction can affect one or both ears. When the eustachian tube does not function properly, air pressure, fluid, or both can build up in the middle ear. What are the causes? This condition occurs when the eustachian tube becomes blocked or cannot open normally. Common causes of this condition include: Ear infections. Colds and other infections that affect the nose, mouth, and throat (upper respiratory tract). Allergies. Irritation from cigarette smoke. Irritation from stomach acid coming up into the esophagus (gastroesophageal reflux). The esophagus is the part of the body that moves food from the mouth to the stomach. Sudden changes in air pressure, such as from descending in an airplane or scuba diving. Abnormal growths in the nose or throat, such as: Growths that line the nose (nasal polyps). Abnormal growth of cells (tumors). Enlarged tissue at the back of the throat (adenoids). What increases the risk? You are more likely to develop this condition if: You smoke. You are overweight. You are a child who has: Certain birth defects of the mouth, such as cleft palate. Large tonsils or adenoids. What are the signs or symptoms? Common symptoms of this condition include: A feeling of fullness in the ear. Ear pain. Clicking or popping noises in the ear. Ringing in the ear (tinnitus). Hearing loss. Loss of  balance. Dizziness. Symptoms may get worse when the air pressure around you changes, such as when you travel to an area of high elevation, fly on an airplane, or go scuba diving. How is this diagnosed? This condition may be diagnosed based on: Your symptoms. A physical exam of your ears, nose, and throat. Tests, such as those that measure: The movement of your eardrum. Your hearing (audiometry). How is this treated? Treatment depends on the cause and severity of your condition. In mild cases, you may relieve your symptoms by moving air into your ears. This is called "popping the ears." In more severe cases, or if you have symptoms of fluid in your ears, treatment may include: Medicines to relieve congestion (decongestants). Medicines that treat allergies (antihistamines). Nasal sprays or ear drops that contain medicines that reduce swelling (steroids). A procedure to drain the fluid in your eardrum. In this procedure, a small tube may be placed in the eardrum to: Drain the fluid. Restore the air in the middle ear space. A procedure to insert a balloon device through the nose to inflate the opening of the eustachian tube (balloon dilation). Follow these instructions at home: Lifestyle Do not do any of the following until your health care provider approves: Travel to high altitudes. Fly in airplanes. Work in a Pension scheme manager or room. Scuba dive. Do not use any products that contain nicotine or tobacco. These products include cigarettes, chewing tobacco, and vaping devices, such as e-cigarettes. If you need help quitting, ask your health care provider. Keep your ears dry. Wear fitted earplugs during showering and bathing. Dry your ears completely after. General instructions Take over-the-counter and prescription medicines only as  told by your health care provider. Use techniques to help pop your ears as recommended by your health care provider. These may include: Chewing  gum. Yawning. Frequent, forceful swallowing. Closing your mouth, holding your nose closed, and gently blowing as if you are trying to blow air out of your nose. Keep all follow-up visits. This is important. Contact a health care provider if: Your symptoms do not go away after treatment. Your symptoms come back after treatment. You are unable to pop your ears. You have: A fever. Pain in your ear. Pain in your head or neck. Fluid draining from your ear. Your hearing suddenly changes. You become very dizzy. You lose your balance. Get help right away if: You have a sudden, severe increase in any of your symptoms. Summary Eustachian tube dysfunction refers to a condition in which a blockage develops in the eustachian tube. It can be caused by ear infections, allergies, inhaled irritants, or abnormal growths in the nose or throat. Symptoms may include ear pain or fullness, hearing loss, or ringing in the ears. Mild cases are treated with techniques to unblock the ears, such as yawning or chewing gum. More severe cases are treated with medicines or procedures. This information is not intended to replace advice given to you by your health care provider. Make sure you discuss any questions you have with your health care provider. Document Revised: 01/23/2021 Document Reviewed: 01/23/2021 Elsevier Patient Education  2022 [place]. Otitis Media, Adult Otitis media is a condition in which the middle ear is red and swollen (inflamed) and full of fluid. The middle ear is the part of the ear that contains bones for hearing as well as air that helps send sounds to the brain. The condition usually goes away on its own. What are the causes? This condition is caused by a blockage in the eustachian tube. This tube connects the middle ear to the back of the nose. It normally allows air into the middle ear. The blockage is caused by fluid or swelling. Problems that can cause blockage include: A cold  or infection that affects the nose, mouth, or throat. Allergies. An irritant, such as tobacco smoke. Adenoids that have become large. The adenoids are soft tissue located in the back of the throat, behind the nose and the roof of the mouth. Growth or swelling in the upper part of the throat, just behind the nose (nasopharynx). Damage to the ear caused by a change in pressure. This is called barotrauma. What increases the risk? You are more likely to develop this condition if you: Smoke or are exposed to tobacco smoke. Have an opening in the roof of your mouth (cleft palate). Have acid reflux. Have problems in your body's defense system (immune system). What are the signs or symptoms? Symptoms of this condition include: Ear pain. Fever. Problems with hearing. Being tired. Fluid leaking from the ear. Ringing in the ear. How is this treated? This condition can go away on its own within 3-5 days. But if the condition is caused by germs (bacteria) and does not go away on its own, or if it keeps coming back, your doctor may: Give you antibiotic medicines. Give you medicines for pain. Follow these instructions at home: Take over-the-counter and prescription medicines only as told by your doctor. If you were prescribed an antibiotic medicine, take it as told by your doctor. Do not stop taking it even if you start to feel better. Keep all follow-up visits. Contact a doctor if: You  have bleeding from your nose. There is a lump on your neck. You are not feeling better in 5 days. You feel worse instead of better. Get help right away if: You have pain that is not helped with medicine. You have swelling, redness, or pain around your ear. You get a stiff neck. You cannot move part of your face (paralysis). You notice that the bone behind your ear hurts when you touch it. You get a very bad headache. Summary Otitis media means that the middle ear is red, swollen, and full of fluid. This  condition usually goes away on its own. If the problem does not go away, treatment may be needed. You may be given medicines to treat the infection or to treat your pain. If you were prescribed an antibiotic medicine, take it as told by your doctor. Do not stop taking it even if you start to feel better. Keep all follow-up visits. This information is not intended to replace advice given to you by your health care provider. Make sure you discuss any questions you have with your health care provider. Document Revised: 02/20/2021 Document Reviewed: 02/20/2021 Elsevier Patient Education  [place].

## 2021-09-11 NOTE — Progress Notes (Signed)
Acute Office Visit  Subjective:    Patient ID: Teresa Blair, female    DOB: [birth year], 70 y.o.   MRN: [id number]  CC: Left ear pain  HPI: Teresa Blair is a 70 year old Caucasian female that presents with recurrent left ear pain and decreased hearing. Onset was one week ago. She was prescribed a course of Azithromycin on 09/05/21 for sinusitis and bilateral otitis media. She also received Kenalog 60 mg IM at that same office visit. States she completed antibiotics 2 days ago. She was scheduled to undergo cardiac ablation for chronic a-fib. Cancelled procedure due to URI.   Past Medical History:  Diagnosis Date   A-fib [facility])    Anxiety 12/01/2020   Asthma    as a child   Atrial fibrillation ([place]) 09/16/2020   Chronic anticoagulation 12/01/2020   Closed fracture of right acetabulum ([place]) 09/03/2011   Enthesopathy of hip region 09/03/2011   Gestational diabetes    Headache    History of CVA (cerebrovascular accident) 09/16/2020   Hypercholesterolemia    Hypertension    Kidney stones    Mixed hyperlipidemia 09/16/2020   Morbid obesity ([place]) 09/16/2020   Obesity    Osteoarthritis    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   Primary hypertension 09/16/2020   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   Right hip pain 09/03/2011   Scleroderma, localized    Sequelae, 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   Stroke ([place]) 09/12/2020   left side weakness/speech affected   Wears glasses     Past Surgical History:  Procedure Laterality Date   BUBBLE STUDY  05/04/2021   Procedure: BUBBLE STUDY;  Surgeon: Elouise Munroe, MD;  Location: [place];  Service: Cardiovascular;;   CARDIOVERSION N/A 05/04/2021   Procedure: CARDIOVERSION;  Surgeon: Elouise Munroe, MD;  Location: [facility] ENDOSCOPY;  Service: Cardiovascular;  Laterality: N/A;   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 06/08/2015   Procedure: CONVERSION TO TOTAL HIP, ANTERIOR TOTAL HIP;  Surgeon: Kathryne Hitch, MD;  Location: [place];  Service: Orthopedics;  Laterality: Right;   FRACTURE SURGERY     right hip   TEE WITHOUT CARDIOVERSION N/A 05/04/2021   Procedure: TRANSESOPHAGEAL ECHOCARDIOGRAM (TEE);  Surgeon: Elouise Munroe, MD;  Location: [facility] ENDOSCOPY;  Service: Cardiovascular;  Laterality: N/A;   TOTAL HIP ARTHROPLASTY Right 06/08/2015   TUBAL LIGATION     WISDOM TOOTH EXTRACTION      Family History  Problem Relation Age of Onset   Cancer Mother    Cancer Father    Hypertension Other     Social History   Socioeconomic History   Marital status: Single    Spouse name: Not on file   Number of children: 3   Years of education: Not on file   Highest education level: Not on file  Occupational History    Comment: full time  Tobacco Use   Smoking status: Never   Smokeless tobacco: Never  Vaping Use   Vaping Use: Never used  Substance and Sexual Activity   Alcohol use: No   Drug use: No   Sexual activity: Not Currently  Other Topics Concern   Not on file  Social History Narrative   Lives alone   Right Handed   Drinks no caffeine   Social Determinants of Health   Financial Resource Strain: Not on file  Food Insecurity: Not on file  Transportation Needs: Not on file  Physical Activity: Not on file  Stress: Not on file  Social Connections: Not  on file  Intimate Partner Violence: Not on file    Outpatient Medications Prior to Visit  Medication Sig Dispense Refill   ELIQUIS 5 MG TABS tablet Take 1 tablet (5 mg total) by mouth 2 (two) times daily. 60 tablet 12   Evolocumab (REPATHA SURECLICK) 127 MG/ML SOAJ Inject 140 mg into the skin every 14 (fourteen) days. 2 mL 11   LORazepam (ATIVAN) 0.5 MG tablet Take 1 tablet (0.5 mg total) by mouth 2 (two) times daily as needed for anxiety (try with 1/2 pill first). 30 tablet 3   metoprolol succinate (TOPROL-XL) 25 MG 24 hr tablet Take one tablet by mouth daily daily in the morning and 0.5 tablet by mouth daily in the evening. (Patient taking  differently: Take 12.5-25 mg by mouth See admin instructions. Take one tablet by mouth daily daily in the morning and 0.5 tablet by mouth daily in the evening.) 135 tablet 3   promethazine-dextromethorphan (PROMETHAZINE-DM) 6.25-15 MG/5ML syrup Take 5 mLs by mouth 4 (four) times daily as needed. 118 mL 1   Facility-Administered Medications Prior to Visit  Medication Dose Route Frequency Provider Last Rate Last Admin   triamcinolone acetonide (KENALOG-40) injection 60 mg  60 mg Intramuscular Once Rip Harbour, NP        Allergies  Allergen Reactions   Perflutren Shortness Of Breath    Protein contrast (pain radiating from feet through spine)   Atorvastatin Other (See Comments)    Joint pain, aches   Other Hives and Swelling    Poppy seeds   Pravastatin Other (See Comments)    Joint pain, aches   Sulfa Antibiotics Hives   Sulfamethoxazole Rash    Review of Systems  Constitutional:  Negative for fatigue and fever.  HENT:  Positive for ear pain (left ear) and hearing loss (left ear). Negative for ear discharge.   Eyes: Negative.   Respiratory: Negative.    Cardiovascular: Negative.   Gastrointestinal: Negative.   Endocrine: Negative.   Genitourinary: Negative.   Musculoskeletal: Negative.   Allergic/Immunologic: Positive for environmental allergies.  Neurological: Negative.   Hematological: Negative.   Psychiatric/Behavioral: Negative.        Objective:    Physical Exam Vitals reviewed.  Constitutional:      Appearance: Normal appearance.  HENT:     Right Ear: Tympanic membrane is erythematous.     Left Ear: Tenderness present. A middle ear effusion is present. Tympanic membrane is injected and erythematous.  Cardiovascular:     Rate and Rhythm: Normal rate. Rhythm irregular.     Pulses: Normal pulses.     Heart sounds: Normal heart sounds.  Pulmonary:     Effort: Pulmonary effort is normal.     Breath sounds: Normal breath sounds.  Skin:    General: Skin is  warm and dry.     Capillary Refill: Capillary refill takes less than 2 seconds.  Neurological:     General: No focal deficit present.     Mental Status: She is alert and oriented to person, place, and time.  Psychiatric:        Mood and Affect: Mood normal.        Behavior: Behavior normal.     Wt Readings from Last 3 Encounters:  09/05/21 240 lb (108.9 kg)  08/23/21 243 lb 6.4 oz (110.4 kg)  07/17/21 254 lb 12.8 oz (115.6 kg)    Health Maintenance Due  Topic Date Due   Hepatitis C Screening  Never done   Zoster  Vaccines- Shingrix (1 of 2) Never done   COLONOSCOPY (Pts 45-83yrs Insurance coverage will need to be confirmed)  Never done   MAMMOGRAM  Never done   DEXA SCAN  Never done   COVID-19 Vaccine (3 - Pfizer risk series) 08/05/2020        Lab Results  Component Value Date   WBC 6.0 08/23/2021   HGB 15.1 08/23/2021   HCT 45.3 08/23/2021   MCV 86 08/23/2021   PLT 220 08/23/2021   Lab Results  Component Value Date   NA 144 08/23/2021   K 4.6 08/23/2021   CO2 24 08/23/2021   GLUCOSE 99 08/23/2021   BUN 12 08/23/2021   CREATININE 0.71 08/23/2021   BILITOT 0.5 08/23/2021   ALKPHOS 87 08/23/2021   AST 15 08/23/2021   ALT 17 08/23/2021   PROT 7.3 08/23/2021   ALBUMIN 4.8 08/23/2021   CALCIUM 10.1 08/23/2021   ANIONGAP 4 (L) 06/10/2015   EGFR 93 08/23/2021   Lab Results  Component Value Date   CHOL 121 08/23/2021   Lab Results  Component Value Date   HDL 51 08/23/2021   Lab Results  Component Value Date   LDLCALC 50 08/23/2021   Lab Results  Component Value Date   TRIG 108 08/23/2021   Lab Results  Component Value Date   CHOLHDL 2.4 08/23/2021        Assessment & Plan:   1. Acute dysfunction of left eustachian tube - predniSONE (DELTASONE) 20 MG tablet; Take 1 tablet (20 mg total) by mouth daily with breakfast. 1 po tid for 3 days then 1 po bid for 3 days then 1 po qd for 3 days  Dispense: 18 tablet; Refill: 0 - fluticasone (FLONASE) 50  MCG/ACT nasal spray; Place 2 sprays into both nostrils daily.  Dispense: 16 g; Refill: 6  2. Non-recurrent acute allergic otitis media of left ear - amoxicillin-clavulanate (AUGMENTIN) 875-125 MG tablet; Take 1 tablet by mouth 2 (two) times daily.  Dispense: 20 tablet; Refill: 0  3. Screening mammogram for breast cancer - MM DIGITAL SCREENING BILATERAL  4. Screening for colon cancer - Ambulatory referral to Gastroenterology  5. Postmenopausal - DG Bone Density   Take Augmentin twice daily for 10 days Take Prednisone as directed Use Flonase nasal spray daily Take Sudafed OTC as directed Take Tylenol/Ibuprofen as directed Follow-up as needed    Follow-up: PRN  An After Visit Summary was printed and given to the patient.  Rip Harbour, NP [place] [phone]

## 2021-09-12 ENCOUNTER — Ambulatory Visit (HOSPITAL_COMMUNITY): Admit: 2021-09-12 | Payer: PPO | Admitting: Cardiology

## 2021-09-12 ENCOUNTER — Encounter (HOSPITAL_COMMUNITY): Payer: Self-pay

## 2021-09-12 SURGERY — ATRIAL FIBRILLATION ABLATION
Anesthesia: General

## 2021-09-14 ENCOUNTER — Encounter (HOSPITAL_BASED_OUTPATIENT_CLINIC_OR_DEPARTMENT_OTHER): Payer: Self-pay | Admitting: Cardiovascular Disease

## 2021-09-14 ENCOUNTER — Telehealth: Payer: Self-pay | Admitting: Legal Medicine

## 2021-09-14 NOTE — Telephone Encounter (Signed)
   Teresa Blair has been scheduled for the following appointment:  WHAT: MAMMOGRAM AND BONE DENSITY WHERE: [facility] DATE: 12/01/21 TIME: 1:30 PM ARRIVAL TIME  A message has been left for the patient.

## 2021-09-14 NOTE — Procedures (Signed)
Patient Name: Teresa Blair, Teresa Blair Date: 08/29/2021 Gender: Female D.O.B: 20-Aug-1951 Age (years): 61 Referring Provider: Godfrey Pick Tobb DO Height (inches): 65 Interpreting Physician: Shelva Majestic MD, ABSM Weight (lbs): 250 RPSGT: Gwenyth Allegra BMI: 42 MRN: [id number] Neck Size: 17.00  CLINICAL INFORMATION The patient is referred for a CPAP titration to treat sleep apnea.  Date of NPSG:  03/16/2021:  AHI 13.2/h; RDI 13.6/h; REM AHI 58.2/h; supine AHI 26.2/h; O2 nadir 73%.  SLEEP STUDY TECHNIQUE As per the AASM Manual for the Scoring of Sleep and Associated Events v2.3 (April 2016) with a hypopnea requiring 4% desaturations.  The channels recorded and monitored were frontal, central and occipital EEG, electrooculogram (EOG), submentalis EMG (chin), nasal and oral airflow, thoracic and abdominal wall motion, anterior tibialis EMG, snore microphone, electrocardiogram, and pulse oximetry. Continuous positive airway pressure (CPAP) was initiated at the beginning of the study and titrated to treat sleep-disordered breathing.  MEDICATIONS amoxicillin-clavulanate (AUGMENTIN) 875-125 MG tablet ELIQUIS 5 MG TABS tablet Evolocumab (REPATHA SURECLICK) 408 MG/ML SOAJ fluticasone (FLONASE) 50 MCG/ACT nasal spray metoprolol succinate (TOPROL-XL) 25 MG 24 hr tablet predniSONE (DELTASONE) 20 MG tablet promethazine-dextromethorphan (PROMETHAZINE-DM) 6.25-15 MG/5ML syrup Medications self-administered by patient taken the night of the study : LORAZEPAM  TECHNICIAN COMMENTS Comments added by technician: None Comments added by scorer: N/A  RESPIRATORY PARAMETERS Optimal PAP Pressure (cm): 14 AHI at Optimal Pressure (/hr): 1.5 Overall Minimal O2 (%): 70.0 Supine % at Optimal Pressure (%): 41 Minimal O2 at Optimal Pressure (%): 86.0   SLEEP ARCHITECTURE The study was initiated at 11:05:40 PM and ended at 5:07:46 AM.  Sleep onset time was 9.3 minutes and the sleep efficiency was 94.0%%.  The total sleep time was 340.5 minutes.  The patient spent 3.2%% of the night in stage N1 sleep, 74.9%% in stage N2 sleep, 0.0%% in stage N3 and 21.9% in REM.Stage REM latency was 71.5 minutes  Wake after sleep onset was 12.3. Alpha intrusion was absent. Supine sleep was 72.39%.  CARDIAC DATA The 2 lead EKG demonstrated sinus rhythm. The mean heart rate was 70.2 beats per minute. Other EKG findings include: Atrial Fibrillation.  LEG MOVEMENT DATA The total Periodic Limb Movements of Sleep (PLMS) were 0. The PLMS index was 0.0. A PLMS index of <15 is considered normal in adults.  IMPRESSIONS - CPAP was initiated at 7 cm and was tittrated to PAP pressure of 14 cm of water (AHI 1.5/h, RDI 1.5/h, O2 nadir 86%. - Central sleep apnea was not noted during this titration (CAI = 0.7/h). - Severe oxygen desaturations were observed during this titration to a nadir of 70.0% at 10 cm of water. - No snoring was audible during this study. - 2-lead EKG demonstrated: Atrial Fibrillation - Clinically significant periodic limb movements were not noted during this study. Arousals associated with PLMs were rare.  DIAGNOSIS - Obstructive Sleep Apnea (G47.33)  RECOMMENDATIONS - Recommend an initial trial of CPAP Auto therapy with EPR 3 at 13 -  18 cm H2O with heated humidification.  A Small size Fisher[employer]Paykel Full Face Mask Simplus mask was used for the titration. - Effort should be made to optimize nasal and oropharyngeal patency. - Avoid alcohol, sedatives and other CNS depressants that may worsen sleep apnea and disrupt normal sleep architecture. - Sleep hygiene should be reviewed to assess factors that may improve sleep quality. - Weight management (BMI 42) and regular exercise should be initiated or continued. - Recommend a download in 30 days and sleep clinic evaluation after  4 weeks of therapy.    [Electronically signed] 09/14/2021 09:18 AM  Shelva Majestic MD, [facility], [place], American Board of  Sleep Medicine   NPI: [id number]  [place] PH: [phone]   FX: [phone] [place]

## 2021-09-22 ENCOUNTER — Telehealth: Payer: Self-pay | Admitting: *Deleted

## 2021-09-22 NOTE — Telephone Encounter (Signed)
Left message that Dr Claiborne Billings has completed her CPAP titration sleep study. CPAP machine order will be sent to [facility]. Call back if any questions and/or concerns.

## 2021-09-22 NOTE — Telephone Encounter (Signed)
-----   Message from Teresa Sine, MD sent at 09/14/2021  9:24 AM EDT ----- Teresa Blair, please notify pt and set up with DME for CPAP initiation

## 2021-10-02 ENCOUNTER — Telehealth: Payer: Self-pay | Admitting: Cardiology

## 2021-10-02 DIAGNOSIS — Z01812 Encounter for preprocedural laboratory examination: Secondary | ICD-10-CM

## 2021-10-02 DIAGNOSIS — I4819 Other persistent atrial fibrillation: Secondary | ICD-10-CM

## 2021-10-02 NOTE — Telephone Encounter (Signed)
Pt reports she is doing much better since we last talked, but it took awhile to get over. She is calling to confirm is she can still have ablation on 11/22 (date held d/t illness). Advised that yes date is still held for her.  Discussed procedure instructions again, very briefly. Will send via mychart and pt agreeable. She will stop by the [facility] this week for blood work. Aware office will call to arrange post procedure follow up. Patient verbalized understanding and agreeable to plan.

## 2021-10-02 NOTE — Telephone Encounter (Signed)
Pt wants to know if pt's Ablasion is still on schedule. Pt has been sick and was advised to call to speak with Timmie Foerster when she is feeling better

## 2021-10-02 NOTE — Telephone Encounter (Signed)
No answer no voice mail  

## 2021-10-04 ENCOUNTER — Other Ambulatory Visit: Payer: PPO

## 2021-10-04 ENCOUNTER — Other Ambulatory Visit: Payer: Self-pay

## 2021-10-04 DIAGNOSIS — I4819 Other persistent atrial fibrillation: Secondary | ICD-10-CM | POA: Diagnosis not present

## 2021-10-04 DIAGNOSIS — Z01812 Encounter for preprocedural laboratory examination: Secondary | ICD-10-CM | POA: Diagnosis not present

## 2021-10-04 MED ORDER — METOPROLOL TARTRATE 50 MG PO TABS: 50.0000 mg | ORAL_TABLET | Freq: Once | ORAL | 0 refills | Status: DC

## 2021-10-04 NOTE — Addendum Note (Signed)
Addended by: Stanton Kidney on: 10/04/2021 05:48 PM   Modules accepted: Orders

## 2021-10-05 LAB — BASIC METABOLIC PANEL
BUN/Creatinine Ratio: 20 (ref 12–28)
BUN: 13 mg/dL (ref 8–27)
CO2: 24 mmol/L (ref 20–29)
Calcium: 9.2 mg/dL (ref 8.7–10.3)
Chloride: 104 mmol/L (ref 96–106)
Creatinine, Ser: 0.66 mg/dL (ref 0.57–1.00)
Glucose: 89 mg/dL (ref 70–99)
Potassium: 4.4 mmol/L (ref 3.5–5.2)
Sodium: 142 mmol/L (ref 134–144)
eGFR: 95 mL/min/{1.73_m2} (ref >59)

## 2021-10-05 LAB — CBC
Hematocrit: 45.4 % (ref 34.0–46.6)
Hemoglobin: 14.6 g/dL (ref 11.1–15.9)
MCH: 28.1 pg (ref 26.6–33.0)
MCHC: 32.2 g/dL (ref 31.5–35.7)
MCV: 88 fL (ref 79–97)
Platelets: 170 10*3/uL (ref 150–450)
RBC: 5.19 x10E6/uL (ref 3.77–5.28)
RDW: 12.8 % (ref 11.7–15.4)
WBC: 6.6 10*3/uL (ref 3.4–10.8)

## 2021-10-09 ENCOUNTER — Telehealth: Payer: Self-pay | Admitting: Cardiology

## 2021-10-09 ENCOUNTER — Telehealth (HOSPITAL_COMMUNITY): Payer: Self-pay | Admitting: Emergency Medicine

## 2021-10-09 NOTE — Telephone Encounter (Signed)
See phone note 10/09/21.

## 2021-10-09 NOTE — Telephone Encounter (Signed)
Attempted to call patient regarding upcoming cardiac CT appointment. °Left message on voicemail with name and callback number °Laithan Conchas RN Navigator Cardiac Imaging °[facility] °[phone] Office °[phone] Cell ° °

## 2021-10-09 NOTE — Telephone Encounter (Signed)
Called patient about message, informed her to take her morning dose and take the one time dose 2 hours prior to CT. Patient verbalized understanding.

## 2021-10-09 NOTE — Telephone Encounter (Signed)
Pt c/o medication issue:  1. Name of Medication: metoprolol tartrate (LOPRESSOR) 50 MG tablet   2. How are you currently taking this medication (dosage and times per day)?   3. Are you having a reaction (difficulty breathing--STAT)?   4. What is your medication issue? Patient states she takes metoprolol succinate (TOPROL-XL) 25 MG 24 hr tablet at 6:00 am daily. She does not know if she needs to take the metoprolol tartrate (LOPRESSOR) 50 MG tablet  in addition to her regular dose, or to skip her normal dose and take this at 11:00 am instead    The patient is worried about overdosing on medication  Please advise

## 2021-10-10 ENCOUNTER — Other Ambulatory Visit: Payer: Self-pay

## 2021-10-10 ENCOUNTER — Ambulatory Visit (HOSPITAL_COMMUNITY)
Admission: RE | Admit: 2021-10-10 | Discharge: 2021-10-10 | Disposition: A | Discharge status: Home / Self Care | Payer: PPO | Source: Ambulatory Visit | Attending: Cardiology | Admitting: Cardiology

## 2021-10-10 DIAGNOSIS — I4819 Other persistent atrial fibrillation: Secondary | ICD-10-CM | POA: Diagnosis not present

## 2021-10-10 MED ORDER — IOHEXOL 350 MG/ML SOLN
95.0000 mL | Freq: Once | INTRAVENOUS | Status: AC | PRN
  Administered 2021-10-10: 95 mL via INTRAVENOUS

## 2021-10-16 NOTE — Anesthesia Preprocedure Evaluation (Addendum)
Anesthesia Evaluation  Patient identified by MRN, date of birth, ID band Patient awake    Reviewed: Allergy & Precautions, H&P , NPO status , Patient's Chart, lab work & pertinent test results, reviewed documented beta blocker date and time   Airway Mallampati: III  TM Distance: >3 FB Neck ROM: Full    Dental no notable dental hx. (+) Teeth Intact, Dental Advisory Given   Pulmonary asthma , sleep apnea ,    Pulmonary exam normal breath sounds clear to auscultation       Cardiovascular Exercise Tolerance: Good hypertension, Pt. on medications and Pt. on home beta blockers + dysrhythmias Atrial Fibrillation  Rhythm:Irregular Rate:Normal     Neuro/Psych  Headaches, Anxiety CVA, Residual Symptoms negative psych ROS   GI/Hepatic negative GI ROS, Neg liver ROS,   Endo/Other  diabetesMorbid obesity  Renal/GU negative Renal ROS  negative genitourinary   Musculoskeletal  (+) Arthritis , Osteoarthritis,    Abdominal   Peds  Hematology negative hematology ROS (+)   Anesthesia Other Findings   Reproductive/Obstetrics negative OB ROS                            Anesthesia Physical Anesthesia Plan  ASA: 3  Anesthesia Plan: General   Post-op Pain Management: Tylenol PO (pre-op) and Minimal or no pain anticipated   Induction: Intravenous  PONV Risk Score and Plan: 3 and Ondansetron, Dexamethasone and Treatment may vary due to age or medical condition  Airway Management Planned: Oral ETT  Additional Equipment:   Intra-op Plan:   Post-operative Plan: Extubation in OR  Informed Consent: I have reviewed the patients History and Physical, chart, labs and discussed the procedure including the risks, benefits and alternatives for the proposed anesthesia with the patient or authorized representative who has indicated his/her understanding and acceptance.     Dental advisory given  Plan Discussed  with: CRNA  Anesthesia Plan Comments:        Anesthesia Quick Evaluation

## 2021-10-16 NOTE — Pre-Procedure Instructions (Signed)
Attempted to call patient regarding procedure instructions.  Left voicemail on the following items: Arrival time 0530 Nothing to eat or drink after midnight No meds AM of procedure Responsible person to drive you home and stay with you for 24 hrs  Have you missed any doses of anti-coagulant Eliquis- take both doses today, don't take any in the morning.

## 2021-10-17 ENCOUNTER — Ambulatory Visit (HOSPITAL_COMMUNITY): Payer: PPO | Admitting: Anesthesiology

## 2021-10-17 ENCOUNTER — Other Ambulatory Visit: Payer: Self-pay

## 2021-10-17 ENCOUNTER — Encounter (HOSPITAL_COMMUNITY)
Admission: RE | Disposition: A | Discharge status: Home / Self Care | Payer: Self-pay | Source: Home / Self Care | Attending: Cardiology

## 2021-10-17 ENCOUNTER — Ambulatory Visit (HOSPITAL_COMMUNITY)
Admission: RE | Admit: 2021-10-17 | Discharge: 2021-10-17 | Disposition: A | Discharge status: Home / Self Care | Payer: PPO | Attending: Cardiology | Admitting: Cardiology

## 2021-10-17 ENCOUNTER — Encounter (HOSPITAL_COMMUNITY): Payer: Self-pay | Admitting: Cardiology

## 2021-10-17 DIAGNOSIS — I4891 Unspecified atrial fibrillation: Secondary | ICD-10-CM | POA: Diagnosis not present

## 2021-10-17 DIAGNOSIS — I4819 Other persistent atrial fibrillation: Secondary | ICD-10-CM | POA: Diagnosis not present

## 2021-10-17 DIAGNOSIS — Z8673 Personal history of transient ischemic attack (TIA), and cerebral infarction without residual deficits: Secondary | ICD-10-CM | POA: Insufficient documentation

## 2021-10-17 DIAGNOSIS — E7800 Pure hypercholesterolemia, unspecified: Secondary | ICD-10-CM | POA: Diagnosis not present

## 2021-10-17 DIAGNOSIS — G4733 Obstructive sleep apnea (adult) (pediatric): Secondary | ICD-10-CM | POA: Diagnosis not present

## 2021-10-17 DIAGNOSIS — I10 Essential (primary) hypertension: Secondary | ICD-10-CM | POA: Insufficient documentation

## 2021-10-17 HISTORY — PX: ATRIAL FIBRILLATION ABLATION: EP1191

## 2021-10-17 LAB — POCT ACTIVATED CLOTTING TIME
Activated Clotting Time: 294 seconds
Activated Clotting Time: 300 seconds
Activated Clotting Time: 312 seconds
Activated Clotting Time: 347 seconds

## 2021-10-17 SURGERY — ATRIAL FIBRILLATION ABLATION
Anesthesia: General

## 2021-10-17 MED ORDER — HEPARIN SODIUM (PORCINE) 1000 UNIT/ML IJ SOLN
INTRAMUSCULAR | Status: AC
  Filled 2021-10-17: qty 1

## 2021-10-17 MED ORDER — SODIUM CHLORIDE 0.9 % IV SOLN: INTRAVENOUS | Status: DC

## 2021-10-17 MED ORDER — ONDANSETRON HCL 4 MG/2ML IJ SOLN
INTRAMUSCULAR | Status: DC | PRN
  Administered 2021-10-17: 4 mg via INTRAVENOUS

## 2021-10-17 MED ORDER — PROPOFOL 10 MG/ML IV BOLUS
INTRAVENOUS | Status: DC | PRN
  Administered 2021-10-17: 120 mg via INTRAVENOUS
  Administered 2021-10-17: 30 mg via INTRAVENOUS

## 2021-10-17 MED ORDER — ONDANSETRON HCL 4 MG/2ML IJ SOLN: 4.0000 mg | Freq: Four times a day (QID) | INTRAMUSCULAR | Status: DC | PRN

## 2021-10-17 MED ORDER — HEPARIN (PORCINE) IN NACL 1000-0.9 UT/500ML-% IV SOLN
INTRAVENOUS | Status: AC
  Filled 2021-10-17: qty 500

## 2021-10-17 MED ORDER — ACETAMINOPHEN 325 MG PO TABS
650.0000 mg | ORAL_TABLET | ORAL | Status: DC | PRN
  Filled 2021-10-17: qty 2

## 2021-10-17 MED ORDER — ACETAMINOPHEN 500 MG PO TABS
1000.0000 mg | ORAL_TABLET | Freq: Once | ORAL | Status: AC
  Administered 2021-10-17: 1000 mg via ORAL
  Filled 2021-10-17: qty 2

## 2021-10-17 MED ORDER — HEPARIN SODIUM (PORCINE) 1000 UNIT/ML IJ SOLN
INTRAMUSCULAR | Status: DC | PRN
  Administered 2021-10-17: 3000 [IU] via INTRAVENOUS
  Administered 2021-10-17: 15000 [IU] via INTRAVENOUS
  Administered 2021-10-17: 2000 [IU] via INTRAVENOUS
  Administered 2021-10-17: 3000 [IU] via INTRAVENOUS

## 2021-10-17 MED ORDER — PHENYLEPHRINE HCL-NACL 20-0.9 MG/250ML-% IV SOLN
INTRAVENOUS | Status: DC | PRN
  Administered 2021-10-17: 25 ug/min via INTRAVENOUS

## 2021-10-17 MED ORDER — ROCURONIUM BROMIDE 10 MG/ML (PF) SYRINGE
PREFILLED_SYRINGE | INTRAVENOUS | Status: DC | PRN
  Administered 2021-10-17: 10 mg via INTRAVENOUS
  Administered 2021-10-17: 50 mg via INTRAVENOUS

## 2021-10-17 MED ORDER — DEXAMETHASONE SODIUM PHOSPHATE 10 MG/ML IJ SOLN
INTRAMUSCULAR | Status: DC | PRN
  Administered 2021-10-17: 4 mg via INTRAVENOUS

## 2021-10-17 MED ORDER — SODIUM CHLORIDE 0.9% FLUSH: 3.0000 mL | Freq: Two times a day (BID) | INTRAVENOUS | Status: DC

## 2021-10-17 MED ORDER — PROTAMINE SULFATE 10 MG/ML IV SOLN
INTRAVENOUS | Status: DC | PRN
  Administered 2021-10-17: 40 mg via INTRAVENOUS

## 2021-10-17 MED ORDER — DOBUTAMINE INFUSION FOR EP/ECHO/NUC (1000 MCG/ML)
INTRAVENOUS | Status: AC
  Filled 2021-10-17: qty 250

## 2021-10-17 MED ORDER — SODIUM CHLORIDE 0.9 % IV SOLN: 250.0000 mL | INTRAVENOUS | Status: DC | PRN

## 2021-10-17 MED ORDER — SUGAMMADEX SODIUM 200 MG/2ML IV SOLN
INTRAVENOUS | Status: DC | PRN
  Administered 2021-10-17: 200 mg via INTRAVENOUS

## 2021-10-17 MED ORDER — SODIUM CHLORIDE 0.9% FLUSH: 3.0000 mL | INTRAVENOUS | Status: DC | PRN

## 2021-10-17 MED ORDER — LIDOCAINE 2% (20 MG/ML) 5 ML SYRINGE
INTRAMUSCULAR | Status: DC | PRN
  Administered 2021-10-17: 60 mg via INTRAVENOUS

## 2021-10-17 MED ORDER — FENTANYL CITRATE (PF) 100 MCG/2ML IJ SOLN
INTRAMUSCULAR | Status: DC | PRN
  Administered 2021-10-17 (×2): 50 ug via INTRAVENOUS

## 2021-10-17 MED ORDER — HEPARIN SODIUM (PORCINE) 1000 UNIT/ML IJ SOLN
INTRAMUSCULAR | Status: DC | PRN
  Administered 2021-10-17: 1000 [IU] via INTRAVENOUS

## 2021-10-17 MED ORDER — HEPARIN (PORCINE) IN NACL 1000-0.9 UT/500ML-% IV SOLN
INTRAVENOUS | Status: DC | PRN
  Administered 2021-10-17 (×5): 500 mL

## 2021-10-17 MED ORDER — DOBUTAMINE INFUSION FOR EP/ECHO/NUC (1000 MCG/ML)
INTRAVENOUS | Status: DC | PRN
  Administered 2021-10-17: 20 ug/kg/min via INTRAVENOUS

## 2021-10-17 MED ORDER — APIXABAN 5 MG PO TABS
5.0000 mg | ORAL_TABLET | Freq: Once | ORAL | Status: AC
Start: 2021-10-17 — End: 2021-10-17
  Administered 2021-10-17: 5 mg via ORAL
  Filled 2021-10-17: qty 1

## 2021-10-17 MED ORDER — MIDAZOLAM HCL 5 MG/5ML IJ SOLN
INTRAMUSCULAR | Status: DC | PRN
  Administered 2021-10-17: 2 mg via INTRAVENOUS

## 2021-10-17 SURGICAL SUPPLY — 23 items
BAG SNAP BAND KOVER 36X36 (MISCELLANEOUS) ×2 IMPLANT
BLANKET WARM UNDERBOD FULL ACC (MISCELLANEOUS) ×2 IMPLANT
CATH OCTARAY 2.0 F 3-3-3-3-3 (CATHETERS) ×2 IMPLANT
CATH S CIRCA THERM PROBE 10F (CATHETERS) ×2 IMPLANT
CATH SMTCH THERMOCOOL SF DF (CATHETERS) ×2 IMPLANT
CATH SOUNDSTAR ECO 8FR (CATHETERS) ×2 IMPLANT
CATH WEBSTER BI DIR CS D-F CRV (CATHETERS) ×2 IMPLANT
CLOSURE PERCLOSE PROSTYLE (VASCULAR PRODUCTS) ×2 IMPLANT
COVER SWIFTLINK CONNECTOR (BAG) ×2 IMPLANT
MAT PREVALON FULL STRYKER (MISCELLANEOUS) ×2 IMPLANT
PACK EP LATEX FREE (CUSTOM PROCEDURE TRAY) ×2
PACK EP LF (CUSTOM PROCEDURE TRAY) ×1 IMPLANT
PAD DEFIB RADIO PHYSIO CONN (PAD) ×2 IMPLANT
PATCH CARTO3 (PAD) ×2 IMPLANT
SHEATH BAYLIS SUREFLEX  M 8.5 (SHEATH) ×1
SHEATH BAYLIS SUREFLEX M 8.5 (SHEATH) ×1 IMPLANT
SHEATH BAYLIS TRANSSEPTAL 98CM (NEEDLE) ×2 IMPLANT
SHEATH CARTO VIZIGO SM CVD (SHEATH) ×2 IMPLANT
SHEATH PINNACLE 7F 10CM (SHEATH) ×2 IMPLANT
SHEATH PINNACLE 8F 10CM (SHEATH) ×4 IMPLANT
SHEATH PINNACLE 9F 10CM (SHEATH) ×2 IMPLANT
SHEATH PROBE COVER 6X72 (BAG) ×2 IMPLANT
TUBING SMART ABLATE COOLFLOW (TUBING) ×2 IMPLANT

## 2021-10-17 NOTE — Progress Notes (Signed)
Patient had minimal bleeding to R groin site. Pressure held for 10 minutes that resolved. Camnitz, MD stated to extend bedrest for 30 minutes and ambulate.

## 2021-10-17 NOTE — Discharge Instructions (Signed)
Post procedure care instructions No driving for 4 days. No lifting over 5 lbs for 1 week. No vigorous or sexual activity for 1 week. You may return to work/your usual activities on 10/25/21. Keep procedure site clean & dry. If you notice increased pain, swelling, bleeding or pus, call/return!  You may shower after 24 hours, but no soaking in baths/hot tubs/pools for 1 week.    You have an appointment set up with the [facility].  Multiple studies have shown that being followed by a dedicated atrial fibrillation clinic in addition to the standard care you receive from your other physicians improves health. We believe that enrollment in the atrial fibrillation clinic will allow [place] to better care for you.   The phone number to the [facility] is [phone]. The clinic is staffed Monday through Friday from 8:30am to 5pm.  Parking Directions: The clinic is located in the Heart and Vascular Building connected to [facility]. 1)From [address] turn on to [address] and go to the 3rd entrance  (Heart and Vascular entrance) on the right. 2)Look to the right for Heart &Vascular Parking Garage. 3)A code for the entrance is required, for Dec is 5555.   4)Take the elevators to the 1st floor. Registration is in the room with the glass walls at the end of the hallway.  If you have any trouble parking or locating the clinic, please don't hesitate to call [phone].

## 2021-10-17 NOTE — Anesthesia Procedure Notes (Signed)
Procedure Name: Intubation Date/Time: 10/17/2021 7:39 AM Performed by: Wilburn Cornelia, CRNA Pre-anesthesia Checklist: Patient identified, Emergency Drugs available, Suction available, Patient being monitored and Timeout performed Patient Re-evaluated:Patient Re-evaluated prior to induction Oxygen Delivery Method: Circle system utilized Preoxygenation: Pre-oxygenation with 100% oxygen Induction Type: IV induction Ventilation: Mask ventilation without difficulty Laryngoscope Size: Mac and 3 Grade View: Grade I Tube type: Oral Tube size: 7.0 mm Number of attempts: 1 Airway Equipment and Method: Stylet Placement Confirmation: ETT inserted through vocal cords under direct vision, positive ETCO2, CO2 detector and breath sounds checked- equal and bilateral Secured at: 21 cm Tube secured with: Tape Dental Injury: Teeth and Oropharynx as per pre-operative assessment

## 2021-10-17 NOTE — H&P (Signed)
Electrophysiology Office Note   Date:  10/17/2021   ID:  Teresa Blair 21-Mar-1951, MRN [id number]  PCP:  Lillard Anes, MD  Cardiologist:  [place] Primary Electrophysiologist:  Raiana Pharris Meredith Leeds, MD    Chief Complaint: AF   History of Present Illness: Teresa Blair is a 70 y.o. female who is being seen today for the evaluation of AF at the request of No ref. provider found. Presenting today for electrophysiology evaluation.  She has a history of persistent atrial fibrillation, CVA, hypertension.  She was hospitalized October 2021 with CVA and new onset atrial fibrillation.  November 2021 she was scheduled for a TEE cardioversion, though she had a left atrial appendage thrombus and thus cardioversion was not pursued.  She had another attempt at TEE and cardioversion 04/26/2021, though she did not convert to sinus rhythm.  Today, denies symptoms of palpitations, chest pain, shortness of breath, orthopnea, PND, lower extremity edema, claudication, dizziness, presyncope, syncope, bleeding, or neurologic sequela. The patient is tolerating medications without difficulties. Ablation today.    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   Primary hypertension 09/16/2020   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   Right hip pain 09/03/2011   Scleroderma, localized    Sequelae, 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   Stroke ([place]) 09/12/2020   left  side weakness/speech affected   Wears glasses    Past Surgical History:  Procedure Laterality Date   BUBBLE STUDY  05/04/2021   Procedure: BUBBLE STUDY;  Surgeon: Elouise Munroe, MD;  Location: [place];  Service: Cardiovascular;;   CARDIOVERSION N/A 05/04/2021   Procedure: CARDIOVERSION;  Surgeon: Elouise Munroe, MD;  Location: [facility] ENDOSCOPY;  Service: Cardiovascular;  Laterality: N/A;   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 06/08/2015   Procedure: CONVERSION TO TOTAL HIP, ANTERIOR TOTAL HIP;  Surgeon: Kathryne Hitch, MD;  Location: [place];  Service: Orthopedics;  Laterality: Right;   FRACTURE SURGERY     right hip   TEE WITHOUT CARDIOVERSION N/A 05/04/2021   Procedure: TRANSESOPHAGEAL ECHOCARDIOGRAM (TEE);  Surgeon: Elouise Munroe, MD;  Location: [facility] ENDOSCOPY;  Service: Cardiovascular;  Laterality: N/A;   TOTAL HIP ARTHROPLASTY Right 06/08/2015   TUBAL LIGATION     WISDOM TOOTH EXTRACTION       Current Facility-Administered Medications  Medication Dose Route Frequency Provider Last Rate Last Admin   0.9 %  sodium chloride infusion   Intravenous Continuous Constance Haw, MD 50 mL/hr at 10/17/21 0641 Continued from Pre-op at 10/17/21 0641    Allergies:   Perflutren, Atorvastatin, Other, Pravastatin, Sulfa antibiotics, and Sulfamethoxazole   Social History:  The patient  reports that she has never smoked. She has never used smokeless tobacco. She reports that she does not drink alcohol and does not use drugs.   Family History:  The patient's family history includes Cancer in her father and mother; Hypertension in an other family  member.   ROS:  Please see the history of present illness.   Otherwise, review of systems is positive for none.   All other systems are reviewed and negative.   PHYSICAL EXAM: VS:  BP (!) 149/87   Pulse 78   Temp 98.3 F (36.8 C) (Oral)   Resp 16   Ht 5\' 5"  (1.651 m)   Wt 106.6 kg   LMP 02/25/2015 (Approximate)   SpO2 98%   BMI 39.11 kg/m  ,  BMI Body mass index is 39.11 kg/m. GEN: Well nourished, well developed, in no acute distress  HEENT: normal  Neck: no JVD, carotid bruits, or masses Cardiac: irregular; no murmurs, rubs, or gallops,no edema  Respiratory:  clear to auscultation bilaterally, normal work of breathing GI: soft, nontender, nondistended, + BS MS: no deformity or atrophy  Skin: warm and dry Neuro:  Strength and sensation are intact Psych: euthymic mood, full affect   Recent Labs: 04/26/2021: Magnesium 2.1 08/23/2021: ALT 17 10/04/2021: BUN 13; Creatinine, Ser 0.66; Hemoglobin 14.6; Platelets 170; Potassium 4.4; Sodium 142    Lipid Panel     Component Value Date/Time   CHOL 121 08/23/2021 0845   TRIG 108 08/23/2021 0845   HDL 51 08/23/2021 0845   CHOLHDL 2.4 08/23/2021 0845   LDLCALC 50 08/23/2021 0845     Wt Readings from Last 3 Encounters:  10/17/21 106.6 kg  09/11/21 109.8 kg  09/05/21 108.9 kg      Other studies Reviewed: Additional studies/ records that were reviewed today include: TEE 05/17/21  Review of the above records today demonstrates:   1. Left ventricular ejection fraction, by estimation, is 60 to 65%. The  left ventricle has normal function.   2. Right ventricular systolic function is normal. The right ventricular  size is normal.   3. Left atrial size was mildly dilated. No left atrial/left atrial  appendage thrombus was detected. The LAA emptying velocity was 39 cm/s.   4. The mitral valve is normal in structure. Trivial mitral valve  regurgitation.   5. The aortic valve is tricuspid. Aortic valve regurgitation is not  visualized. No aortic stenosis is present.   6. There is mild (Grade II) atheroma plaque involving the transverse  aorta.   7. Evidence of atrial level shunting detected by color flow Doppler.  There is a small patent foramen ovale with predominantly left to right  shunting across the atrial septum.    ASSESSMENT AND PLAN:  1.  Persistent atrial  fibrillation: [name] has presented today for surgery, with the diagnosis of AF.  The various methods of treatment have been discussed with the patient and family. After consideration of risks, benefits and other options for treatment, the patient has consented to  Procedure(s): Catheter ablation as a surgical intervention .  Risks include but not limited to complete heart block, stroke, esophageal damage, nerve damage, bleeding, vascular damage, tamponade, perforation, MI, and death. The patient's history has been reviewed, patient examined, no change in status, stable for surgery.  I have reviewed the patient's chart and labs.  Questions were answered to the patient's satisfaction.    Chioma Mukherjee Curt Bears, MD 10/17/2021 7:06 AM

## 2021-10-17 NOTE — Anesthesia Postprocedure Evaluation (Signed)
Anesthesia Post Note  Patient: Teresa Blair  Procedure(s) Performed: ATRIAL FIBRILLATION ABLATION     Patient location during evaluation: Cath Lab Anesthesia Type: General Level of consciousness: awake and alert Pain management: pain level controlled Vital Signs Assessment: post-procedure vital signs reviewed and stable Respiratory status: spontaneous breathing, nonlabored ventilation and respiratory function stable Cardiovascular status: blood pressure returned to baseline and stable Postop Assessment: no apparent nausea or vomiting Anesthetic complications: no   There were no known notable events for this encounter.  Last Vitals:  Vitals:   10/17/21 1050 10/17/21 1055  BP: (!) 122/56 (!) 114/59  Pulse: 76 71  Resp: 18 16  Temp: (!) 36.4 C   SpO2: 96% 91%    Last Pain:  Vitals:   10/17/21 0958  TempSrc: Core (Comment)  PainSc: 0-No pain                 Shambria Camerer,W. EDMOND

## 2021-10-17 NOTE — Transfer of Care (Signed)
Immediate Anesthesia Transfer of Care Note  Patient: Teresa Blair  Procedure(s) Performed: ATRIAL FIBRILLATION ABLATION  Patient Location: Cath Lab  Anesthesia Type:General  Level of Consciousness: awake and alert   Airway & Oxygen Therapy: Patient Spontanous Breathing and Patient connected to nasal cannula oxygen  Post-op Assessment: Report given to RN and Post -op Vital signs reviewed and stable  Post vital signs: Reviewed and stable  Last Vitals:  Vitals Value Taken Time  BP 126/75 10/17/21 1034  Temp    Pulse 67 10/17/21 1037  Resp 17 10/17/21 1037  SpO2 96 % 10/17/21 1037  Vitals shown include unvalidated device data.  Last Pain:  Vitals:   10/17/21 0958  TempSrc: Core (Comment)  PainSc: 0-No pain      Patients Stated Pain Goal: 3 (02/77/41 2878)  Complications: There were no known notable events for this encounter.

## 2021-10-18 ENCOUNTER — Encounter (HOSPITAL_COMMUNITY): Payer: Self-pay | Admitting: Cardiology

## 2021-10-26 ENCOUNTER — Encounter (HOSPITAL_COMMUNITY): Payer: Self-pay

## 2021-10-30 ENCOUNTER — Ambulatory Visit: Payer: PPO

## 2021-11-01 ENCOUNTER — Ambulatory Visit (HOSPITAL_COMMUNITY)
Admission: RE | Admit: 2021-11-01 | Discharge: 2021-11-01 | Disposition: A | Discharge status: Home / Self Care | Payer: PPO | Source: Ambulatory Visit | Attending: Nurse Practitioner | Admitting: Nurse Practitioner

## 2021-11-01 ENCOUNTER — Other Ambulatory Visit: Payer: Self-pay

## 2021-11-01 VITALS — BP 150/50 | HR 58 | Ht 65.0 in | Wt 241.6 lb

## 2021-11-01 DIAGNOSIS — I4891 Unspecified atrial fibrillation: Secondary | ICD-10-CM | POA: Insufficient documentation

## 2021-11-01 DIAGNOSIS — Z79899 Other long term (current) drug therapy: Secondary | ICD-10-CM | POA: Insufficient documentation

## 2021-11-01 DIAGNOSIS — Z7901 Long term (current) use of anticoagulants: Secondary | ICD-10-CM | POA: Diagnosis not present

## 2021-11-01 DIAGNOSIS — D6869 Other thrombophilia: Secondary | ICD-10-CM

## 2021-11-01 DIAGNOSIS — G4733 Obstructive sleep apnea (adult) (pediatric): Secondary | ICD-10-CM | POA: Insufficient documentation

## 2021-11-01 DIAGNOSIS — I4819 Other persistent atrial fibrillation: Secondary | ICD-10-CM

## 2021-11-01 MED ORDER — DILTIAZEM HCL 30 MG PO TABS: ORAL_TABLET | ORAL | 1 refills | Status: DC

## 2021-11-01 NOTE — Patient Instructions (Signed)
Diltiazem 30mg  take 1 tablet Every 4 Hours As Needed For HR >100 and Top BP >100

## 2021-11-02 ENCOUNTER — Encounter (HOSPITAL_COMMUNITY): Payer: Self-pay | Admitting: Nurse Practitioner

## 2021-11-02 NOTE — Progress Notes (Signed)
Primary Care Physician: Lillard Anes, MD Referring Physician: Dr. Milton Ferguson TIFFANEY HEIMANN is a 70 y.o. female with a h/o atrial fib ablation 11/22 with Dr. Curt Bears. She asked to be seen today as she had afib over the weekend. She is in SR today. Having afib concerned her as she through the procedure was not  effective. No trigger for the afib. She had HR's around 100 bpm. SHe is metoprolol daily. Did not take any extra rate control meds during the episode. She does have OSA but is waiting on CPAP, on back order. She is trying to lose weight had has lost 20 lbs so far. No swallowing or groin issues since ablation.    Today, she denies symptoms of palpitations, chest pain, shortness of breath, orthopnea, PND, lower extremity edema, dizziness, presyncope, syncope, or neurologic sequela. The patient is tolerating medications without difficulties and is otherwise without complaint today.   Past Medical History:  Diagnosis Date   A-fib [facility])    Anxiety 12/01/2020   Asthma    as a child   Atrial fibrillation ([place]) 09/16/2020   Chronic anticoagulation 12/01/2020   Closed fracture of right acetabulum ([place]) 09/03/2011   Enthesopathy of hip region 09/03/2011   Gestational diabetes    Headache    History of CVA (cerebrovascular accident) 09/16/2020   Hypercholesterolemia    Hypertension    Kidney stones    Mixed hyperlipidemia 09/16/2020   Morbid obesity ([place]) 09/16/2020   Obesity    Osteoarthritis    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   Primary hypertension 09/16/2020   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   Right hip pain 09/03/2011   Scleroderma, localized    Sequelae, 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   Stroke ([place]) 09/12/2020   left side weakness/speech affected   Wears glasses    Past Surgical History:  Procedure Laterality Date   ATRIAL FIBRILLATION ABLATION N/A 10/17/2021   Procedure: ATRIAL FIBRILLATION ABLATION;  Surgeon: Constance Haw, MD;  Location: [place] CV LAB;  Service: Cardiovascular;  Laterality: N/A;   BUBBLE STUDY  05/04/2021   Procedure: BUBBLE STUDY;  Surgeon: Elouise Munroe, MD;  Location: [place];  Service: Cardiovascular;;   CARDIOVERSION N/A 05/04/2021   Procedure: CARDIOVERSION;  Surgeon: Elouise Munroe, MD;  Location: [facility] ENDOSCOPY;  Service: Cardiovascular;  Laterality: N/A;   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 06/08/2015   Procedure: CONVERSION TO TOTAL HIP, ANTERIOR TOTAL HIP;  Surgeon: Kathryne Hitch, MD;  Location: [place];  Service: Orthopedics;  Laterality: Right;   FRACTURE SURGERY     right hip   TEE WITHOUT CARDIOVERSION N/A 05/04/2021   Procedure: TRANSESOPHAGEAL ECHOCARDIOGRAM (TEE);  Surgeon: Elouise Munroe, MD;  Location: [facility] ENDOSCOPY;  Service: Cardiovascular;  Laterality: N/A;   TOTAL HIP ARTHROPLASTY Right 06/08/2015   TUBAL LIGATION     WISDOM TOOTH EXTRACTION      Current Outpatient Medications  Medication Sig Dispense Refill   diclofenac Sodium (VOLTAREN) 1 % GEL Apply 1 application topically daily as needed (pain).     diltiazem (CARDIZEM) 30 MG tablet Take 1 Tablet Every 4 Hours As Needed For HR >100 and Top BP >100 45 tablet 1   ELIQUIS 5 MG TABS tablet Take 1 tablet (5 mg total) by mouth 2 (two) times daily. 60 tablet 12   Evolocumab (REPATHA SURECLICK) 161 MG/ML SOAJ Inject 140 mg into the skin every 14 (fourteen) days. 2 mL 11   metoprolol succinate (TOPROL-XL) 25  MG 24 hr tablet Take one tablet by mouth daily daily in the morning and 0.5 tablet by mouth daily in the evening. (Patient taking differently: Take 12.5-25 mg by mouth See admin instructions. Take one tablet by mouth daily daily in the morning and 0.5 tablet by mouth daily in the evening.) 135 tablet 3   vitamin C (ASCORBIC ACID) 250 MG tablet Take 500 mg by mouth daily.     No current facility-administered medications for this encounter.    Allergies  Allergen Reactions   Perflutren Shortness Of  Breath    Protein contrast (pain radiating from feet through spine)   Atorvastatin Other (See Comments)    Joint pain, aches   Other Hives and Swelling    Poppy seeds   Pravastatin Other (See Comments)    Joint pain, aches   Sulfa Antibiotics Hives   Sulfamethoxazole Rash    Social History   Socioeconomic History   Marital status: Divorced    Spouse name: Not on file   Number of children: 3   Years of education: Not on file   Highest education level: Not on file  Occupational History    Comment: full time  Tobacco Use   Smoking status: Never   Smokeless tobacco: Never  Vaping Use   Vaping Use: Never used  Substance and Sexual Activity   Alcohol use: No   Drug use: No   Sexual activity: Not Currently  Other Topics Concern   Not on file  Social History Narrative   Lives alone   Right Handed   Drinks no caffeine   Social Determinants of Health   Financial Resource Strain: Not on file  Food Insecurity: Not on file  Transportation Needs: Not on file  Physical Activity: Not on file  Stress: Not on file  Social Connections: Not on file  Intimate Partner Violence: Not on file    Family History  Problem Relation Age of Onset   Cancer Mother    Cancer Father    Hypertension Other     ROS- All systems are reviewed and negative except as per the HPI above  Physical Exam: Vitals:   11/01/21 1505  BP: (!) 150/50  Pulse: (!) 58  Weight: 109.6 kg  Height: 5\' 5"  (1.651 m)   Wt Readings from Last 3 Encounters:  11/01/21 109.6 kg  10/17/21 106.6 kg  09/11/21 109.8 kg    Labs: Lab Results  Component Value Date   NA 142 10/04/2021   K 4.4 10/04/2021   CL 104 10/04/2021   CO2 24 10/04/2021   GLUCOSE 89 10/04/2021   BUN 13 10/04/2021   CREATININE 0.66 10/04/2021   CALCIUM 9.2 10/04/2021   MG 2.1 04/26/2021   Lab Results  Component Value Date   INR 1.01 05/26/2015   Lab Results  Component Value Date   CHOL 121 08/23/2021   HDL 51 08/23/2021    LDLCALC 50 08/23/2021   TRIG 108 08/23/2021     GEN- The patient is well appearing, alert and oriented x 3 today.   Head- normocephalic, atraumatic Eyes-  Sclera clear, conjunctiva pink Ears- hearing intact Oropharynx- clear Neck- supple, no JVP Lymph- no cervical lymphadenopathy Lungs- Clear to ausculation bilaterally, normal work of breathing Heart- Regular rate and rhythm, no murmurs, rubs or gallops, PMI not laterally displaced GI- soft, NT, ND, + BS Extremities- no clubbing, cyanosis, or edema MS- no significant deformity or atrophy Skin- no rash or lesion Psych- euthymic mood, full affect Neuro-  strength and sensation are intact  EKG-Vent. rate 58 BPM PR interval 172 ms QRS duration 84 ms QT/QTcB 428/420 ms P-R-T axes 48 57 59 Sinus bradycardia with sinus arrhythmia Cannot rule out Anterior infarct , age undetermined Abnormal ECG    Assessment and Plan:  1. Afib  Recent afib ablation Some afib over the weekend  In SR today Reassured pt that this is normal after ablation to have some afib and does not mean the procedure was not successful  Continue daily metoprolol   I will Rx cardizem 30 mg and instructions given on how to use to treat afib episodes  Avoid alcohol  Awaiting CPAP Continue with weight loss efforts Exercise program encouraged  2. [id number]  score of at least 5 Continue eliquis 5 mg bid  Reminded not to miss doses    F/u with Dr. Curt Bears  as scheduled Afib clinic as needed    Butch Penny C. [place], [facility] [address] [place], [place] [address] [phone]

## 2021-11-13 ENCOUNTER — Encounter: Payer: Self-pay | Admitting: Cardiology

## 2021-11-13 MED ORDER — ELIQUIS 5 MG PO TABS: 5.0000 mg | ORAL_TABLET | Freq: Two times a day (BID) | ORAL | 12 refills | Status: DC

## 2021-11-13 MED ORDER — METOPROLOL SUCCINATE ER 25 MG PO TB24: ORAL_TABLET | ORAL | 3 refills | Status: DC

## 2021-11-14 ENCOUNTER — Ambulatory Visit (HOSPITAL_COMMUNITY): Payer: PPO | Admitting: Nurse Practitioner

## 2021-11-30 ENCOUNTER — Other Ambulatory Visit: Payer: Self-pay | Admitting: Cardiology

## 2021-11-30 NOTE — Telephone Encounter (Signed)
Prescription refill request for Eliquis received. Indication:Afib Last office visit:12/22 Scr:0.6 Age: 71 Weight:109.6 kg  Prescription refilled

## 2021-12-11 ENCOUNTER — Ambulatory Visit (INDEPENDENT_AMBULATORY_CARE_PROVIDER_SITE_OTHER): Payer: PPO | Admitting: Legal Medicine

## 2021-12-11 ENCOUNTER — Encounter: Payer: Self-pay | Admitting: Legal Medicine

## 2021-12-11 ENCOUNTER — Other Ambulatory Visit: Payer: Self-pay

## 2021-12-11 VITALS — BP 130/70 | HR 72 | Temp 98.7°F | Resp 15 | Ht 65.0 in | Wt 242.0 lb

## 2021-12-11 DIAGNOSIS — N3000 Acute cystitis without hematuria: Secondary | ICD-10-CM | POA: Diagnosis not present

## 2021-12-11 DIAGNOSIS — N951 Menopausal and female climacteric states: Secondary | ICD-10-CM

## 2021-12-11 LAB — POCT URINALYSIS DIP (CLINITEK)
Bilirubin, UA: NEGATIVE
Blood, UA: NEGATIVE
Glucose, UA: NEGATIVE mg/dL
Ketones, POC UA: NEGATIVE mg/dL
Nitrite, UA: NEGATIVE
POC PROTEIN,UA: NEGATIVE
Spec Grav, UA: 1.025 (ref 1.010–1.025)
Urobilinogen, UA: 0.2 E.U./dL
pH, UA: 5.5 (ref 5.0–8.0)

## 2021-12-11 MED ORDER — CIPROFLOXACIN HCL 500 MG PO TABS: 500.0000 mg | ORAL_TABLET | Freq: Two times a day (BID) | ORAL | 0 refills | Status: AC

## 2021-12-11 MED ORDER — ESTROGENS CONJUGATED 0.625 MG/GM VA CREA: 1.0000 | TOPICAL_CREAM | Freq: Every day | VAGINAL | 12 refills | Status: DC

## 2021-12-11 NOTE — Progress Notes (Signed)
Acute Office Visit  Subjective:    Patient ID: Teresa Blair, female    DOB: 1951/03/02, 71 y.o.   MRN: [id number]  Chief Complaint  Patient presents with   Urinary Tract Infection    HPI: Patient is in today for dysuria, back pain, frequency urination since 12/08/2021. Patient does not feel well.   Past Medical History:  Diagnosis Date   A-fib [facility])    Anxiety 12/01/2020   Asthma    as a child   Atrial fibrillation ([place]) 09/16/2020   Chronic anticoagulation 12/01/2020   Closed fracture of right acetabulum ([place]) 09/03/2011   Enthesopathy of hip region 09/03/2011   Gestational diabetes    Headache    History of CVA (cerebrovascular accident) 09/16/2020   Hypercholesterolemia    Hypertension    Kidney stones    Mixed hyperlipidemia 09/16/2020   Morbid obesity ([place]) 09/16/2020   Obesity    Osteoarthritis    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   Primary hypertension 09/16/2020   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   Right hip pain 09/03/2011   Scleroderma, localized    Sequelae, 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   Stroke ([place]) 09/12/2020   left side weakness/speech affected   Wears glasses     Past Surgical History:  Procedure Laterality Date   ATRIAL FIBRILLATION ABLATION N/A 10/17/2021   Procedure: ATRIAL FIBRILLATION ABLATION;  Surgeon: Constance Haw, MD;  Location: [place] CV LAB;  Service: Cardiovascular;  Laterality: N/A;   BUBBLE STUDY  05/04/2021   Procedure: BUBBLE STUDY;  Surgeon: Elouise Munroe, MD;  Location: [place];  Service: Cardiovascular;;   CARDIOVERSION N/A 05/04/2021   Procedure: CARDIOVERSION;  Surgeon: Elouise Munroe, MD;  Location: [facility] ENDOSCOPY;  Service: Cardiovascular;  Laterality: N/A;   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 06/08/2015   Procedure: CONVERSION TO TOTAL HIP, ANTERIOR TOTAL HIP;  Surgeon: Kathryne Hitch, MD;  Location: [place];  Service: Orthopedics;  Laterality: Right;   FRACTURE SURGERY      right hip   TEE WITHOUT CARDIOVERSION N/A 05/04/2021   Procedure: TRANSESOPHAGEAL ECHOCARDIOGRAM (TEE);  Surgeon: Elouise Munroe, MD;  Location: [facility] ENDOSCOPY;  Service: Cardiovascular;  Laterality: N/A;   TOTAL HIP ARTHROPLASTY Right 06/08/2015   TUBAL LIGATION     WISDOM TOOTH EXTRACTION      Family History  Problem Relation Age of Onset   Cancer Mother    Cancer Father    Hypertension Other     Social History   Socioeconomic History   Marital status: Divorced    Spouse name: Not on file   Number of children: 3   Years of education: Not on file   Highest education level: Not on file  Occupational History    Comment: full time  Tobacco Use   Smoking status: Never   Smokeless tobacco: Never  Vaping Use   Vaping Use: Never used  Substance and Sexual Activity   Alcohol use: No   Drug use: No   Sexual activity: Not Currently  Other Topics Concern   Not on file  Social History Narrative   Lives alone   Right Handed   Drinks no caffeine   Social Determinants of Health   Financial Resource Strain: Not on file  Food Insecurity: Not on file  Transportation Needs: Not on file  Physical Activity: Not on file  Stress: Not on file  Social Connections: Not on file  Intimate Partner Violence: Not on file    Outpatient  Medications Prior to Visit  Medication Sig Dispense Refill   diclofenac Sodium (VOLTAREN) 1 % GEL Apply 1 application topically daily as needed (pain).     diltiazem (CARDIZEM) 30 MG tablet Take 1 Tablet Every 4 Hours As Needed For HR >100 and Top BP >100 45 tablet 1   ELIQUIS 5 MG TABS tablet TAKE 1 TABLET(5 MG) BY MOUTH TWICE DAILY 60 tablet 12   Evolocumab (REPATHA SURECLICK) 140 MG/ML SOAJ Inject 140 mg into the skin every 14 (fourteen) days. 2 mL 11   metoprolol succinate (TOPROL-XL) 25 MG 24 hr tablet Take one tablet by mouth daily daily in the morning and 0.5 tablet by mouth daily in the evening. 135 tablet 3   vitamin C (ASCORBIC ACID) 250 MG tablet  Take 500 mg by mouth daily.     No facility-administered medications prior to visit.    Allergies  Allergen Reactions   Perflutren Shortness Of Breath    Protein contrast (pain radiating from feet through spine)   Atorvastatin Other (See Comments)    Joint pain, aches   Other Hives and Swelling    Poppy seeds   Pravastatin Other (See Comments)    Joint pain, aches   Sulfa Antibiotics Hives   Sulfamethoxazole Rash    Review of Systems  Constitutional:  Positive for chills. Negative for fatigue and fever.  HENT:  Negative for congestion, ear pain and sore throat.   Respiratory:  Negative for cough and shortness of breath.   Cardiovascular:  Positive for palpitations. Negative for chest pain.  Gastrointestinal:  Negative for abdominal pain, constipation, diarrhea, nausea and vomiting.  Endocrine: Negative for polydipsia, polyphagia and polyuria.  Genitourinary:  Positive for dysuria, flank pain and frequency. Negative for difficulty urinating and vaginal discharge.  Musculoskeletal:  Negative for arthralgias, back pain and myalgias.  Skin:  Negative for rash.  Neurological:  Negative for headaches.  Psychiatric/Behavioral:  Negative for dysphoric mood. The patient is not nervous/anxious.       Objective:    Physical Exam Vitals reviewed.  Constitutional:      Appearance: Normal appearance.  HENT:     Right Ear: Tympanic membrane normal.     Left Ear: Tympanic membrane normal.  Eyes:     Extraocular Movements: Extraocular movements intact.     Conjunctiva/sclera: Conjunctivae normal.     Pupils: Pupils are equal, round, and reactive to light.  Neck:     Vascular: No carotid bruit.  Cardiovascular:     Rate and Rhythm: Normal rate and regular rhythm.     Pulses: Normal pulses.     Heart sounds: Normal heart sounds.  Pulmonary:     Effort: Pulmonary effort is normal.     Breath sounds: Normal breath sounds.  Abdominal:     General: Bowel sounds are normal.      Palpations: Abdomen is soft.     Tenderness: There is no abdominal tenderness. There is no right CVA tenderness or left CVA tenderness.  Skin:    General: Skin is warm and dry.     Capillary Refill: Capillary refill takes less than 2 seconds.  Neurological:     General: No focal deficit present.     Mental Status: She is alert and oriented to person, place, and time.  Psychiatric:        Mood and Affect: Mood normal.        Behavior: Behavior normal.    BP 130/70    Pulse 72  Temp 98.7 F (37.1 C)    Resp 15    Ht $R'5\' 5"'LZ$  (1.651 m)    Wt 242 lb (109.8 kg)    LMP 02/25/2015 (Approximate)    SpO2 96%    BMI 40.27 kg/m  Wt Readings from Last 3 Encounters:  12/11/21 242 lb (109.8 kg)  11/01/21 241 lb 9.6 oz (109.6 kg)  10/17/21 235 lb (106.6 kg)    Health Maintenance Due  Topic Date Due   Hepatitis C Screening  Never done   TETANUS/TDAP  Never done   Zoster Vaccines- Shingrix (1 of 2) Never done   COLONOSCOPY (Pts 45-33yrs Insurance coverage will need to be confirmed)  Never done   MAMMOGRAM  Never done   Pneumonia Vaccine 37+ Years old (2 - PCV) 06/09/2016   DEXA SCAN  Never done    There are no preventive care reminders to display for this patient.   No results found for: TSH Lab Results  Component Value Date   WBC 6.6 10/04/2021   HGB 14.6 10/04/2021   HCT 45.4 10/04/2021   MCV 88 10/04/2021   PLT 170 10/04/2021   Lab Results  Component Value Date   NA 142 10/04/2021   K 4.4 10/04/2021   CO2 24 10/04/2021   GLUCOSE 89 10/04/2021   BUN 13 10/04/2021   CREATININE 0.66 10/04/2021   BILITOT 0.5 08/23/2021   ALKPHOS 87 08/23/2021   AST 15 08/23/2021   ALT 17 08/23/2021   PROT 7.3 08/23/2021   ALBUMIN 4.8 08/23/2021   CALCIUM 9.2 10/04/2021   ANIONGAP 4 (L) 06/10/2015   EGFR 95 10/04/2021   Lab Results  Component Value Date   CHOL 121 08/23/2021   Lab Results  Component Value Date   HDL 51 08/23/2021   Lab Results  Component Value Date   LDLCALC 50  08/23/2021   Lab Results  Component Value Date   TRIG 108 08/23/2021   Lab Results  Component Value Date   CHOLHDL 2.4 08/23/2021   No results found for: HGBA1C     Assessment & Plan:   Problem List Items Addressed This Visit   None Visit Diagnoses     Acute cystitis without hematuria    -  Primary   Relevant Orders   POCT URINALYSIS DIP (CLINITEK) Patient hs acute cystitis, treat with cipro        Orders Placed This Encounter  Procedures   Urine Culture   POCT URINALYSIS DIP (CLINITEK)     Follow-up: Return if symptoms worsen or fail to improve.  I,Benz Vandenberghe,acting as a scribe for Reinaldo Meeker, MD.,have documented all relevant documentation on the behalf of Reinaldo Meeker, MD,as directed by  Reinaldo Meeker, MD while in the presence of Reinaldo Meeker, MD.   An After Visit Summary was printed and given to the patient.  Reinaldo Meeker, MD [facility] [phone]

## 2021-12-12 LAB — URINE CULTURE

## 2021-12-13 NOTE — Progress Notes (Signed)
Urine culture normal lp

## 2021-12-20 ENCOUNTER — Other Ambulatory Visit: Payer: Self-pay | Admitting: Cardiology

## 2021-12-25 ENCOUNTER — Encounter: Payer: Self-pay | Admitting: Legal Medicine

## 2021-12-25 ENCOUNTER — Telehealth (INDEPENDENT_AMBULATORY_CARE_PROVIDER_SITE_OTHER): Payer: PPO | Admitting: Legal Medicine

## 2021-12-25 VITALS — BP 133/69 | HR 60 | Temp 102.0°F | Ht 65.0 in | Wt 242.0 lb

## 2021-12-25 DIAGNOSIS — N3090 Cystitis, unspecified without hematuria: Secondary | ICD-10-CM

## 2021-12-25 DIAGNOSIS — U071 COVID-19: Secondary | ICD-10-CM | POA: Diagnosis not present

## 2021-12-25 MED ORDER — NIRMATRELVIR/RITONAVIR (PAXLOVID)TABLET: 3.0000 | ORAL_TABLET | Freq: Two times a day (BID) | ORAL | 0 refills | Status: AC

## 2021-12-25 MED ORDER — DOXYCYCLINE HYCLATE 100 MG PO TABS: 100.0000 mg | ORAL_TABLET | Freq: Two times a day (BID) | ORAL | 0 refills | Status: DC

## 2021-12-25 NOTE — Telephone Encounter (Signed)
Pt no showed mammogram and bone density on 12/01/21

## 2021-12-25 NOTE — Progress Notes (Signed)
Virtual Visit via Video Note   This visit type was conducted due to national recommendations for restrictions regarding the COVID-19 Pandemic (e.g. social distancing) in an effort to limit this patient's exposure and mitigate transmission in our community.  Due to her co-morbid illnesses, this patient is at least at moderate risk for complications without adequate follow up.  This format is felt to be most appropriate for this patient at this time.  All issues noted in this document were discussed and addressed.  A limited physical exam was performed with this format.  A verbal consent was obtained for the virtual visit.   Date:  12/25/2021   ID:  Teresa, Blair 14-May-1951, MRN [id number]  Patient Location: Home Provider Location: Office/Clinic  PCP:  Lillard Anes, MD   Evaluation Performed:  New Patient Evaluation  Chief Complaint:  Patient took a covid test on 12/24/2020 and it came positive. She mentioned to have headache, cough, diarrhea, e  History of Present Illness:    Teresa Blair is a 71 y.o. female with covid positive. She has pyuria with headache Temp 102 degrees, back pain  The patient does have symptoms concerning for COVID-19 infection (fever, chills, cough, or new shortness of breath).    Past Medical History:  Diagnosis Date   A-fib [facility])    Anxiety 12/01/2020   Asthma    as a child   Atrial fibrillation ([place]) 09/16/2020   Chronic anticoagulation 12/01/2020   Closed fracture of right acetabulum ([place]) 09/03/2011   Enthesopathy of hip region 09/03/2011   Gestational diabetes    Headache    History of CVA (cerebrovascular accident) 09/16/2020   Hypercholesterolemia    Hypertension    Kidney stones    Mixed hyperlipidemia 09/16/2020   Morbid obesity ([place]) 09/16/2020   Obesity    Osteoarthritis    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   Primary hypertension 09/16/2020   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    Right hip pain 09/03/2011   Scleroderma, localized    Sequelae, 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   Stroke ([place]) 09/12/2020   left side weakness/speech affected   Wears glasses     Past Surgical History:  Procedure Laterality Date   ATRIAL FIBRILLATION ABLATION N/A 10/17/2021   Procedure: ATRIAL FIBRILLATION ABLATION;  Surgeon: Constance Haw, MD;  Location: [place] CV LAB;  Service: Cardiovascular;  Laterality: N/A;   BUBBLE STUDY  05/04/2021   Procedure: BUBBLE STUDY;  Surgeon: Elouise Munroe, MD;  Location: [place];  Service: Cardiovascular;;   CARDIOVERSION N/A 05/04/2021   Procedure: CARDIOVERSION;  Surgeon: Elouise Munroe, MD;  Location: [facility] ENDOSCOPY;  Service: Cardiovascular;  Laterality: N/A;   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 06/08/2015   Procedure: CONVERSION TO TOTAL HIP, ANTERIOR TOTAL HIP;  Surgeon: Kathryne Hitch, MD;  Location: [place];  Service: Orthopedics;  Laterality: Right;   FRACTURE SURGERY     right hip   TEE WITHOUT CARDIOVERSION N/A 05/04/2021   Procedure: TRANSESOPHAGEAL ECHOCARDIOGRAM (TEE);  Surgeon: Elouise Munroe, MD;  Location: [facility] ENDOSCOPY;  Service: Cardiovascular;  Laterality: N/A;   TOTAL HIP ARTHROPLASTY Right 06/08/2015   TUBAL LIGATION     WISDOM TOOTH EXTRACTION      Family History  Problem Relation Age of Onset   Cancer Mother    Cancer Father    Hypertension Other     Social History   Socioeconomic History   Marital status: Divorced    Spouse name: Not  on file   Number of children: 3   Years of education: Not on file   Highest education level: Not on file  Occupational History    Comment: full time  Tobacco Use   Smoking status: Never   Smokeless tobacco: Never  Vaping Use   Vaping Use: Never used  Substance and Sexual Activity   Alcohol use: No   Drug use: No   Sexual activity: Not Currently  Other Topics Concern   Not on file  Social History Narrative   Lives alone   Right Handed   Drinks no caffeine   Social  Determinants of Health   Financial Resource Strain: Not on file  Food Insecurity: Not on file  Transportation Needs: Not on file  Physical Activity: Not on file  Stress: Not on file  Social Connections: Not on file  Intimate Partner Violence: Not on file    Outpatient Medications Prior to Visit  Medication Sig Dispense Refill   conjugated estrogens (PREMARIN) vaginal cream Place 1 Applicatorful vaginally daily. 42.5 g 12   diclofenac Sodium (VOLTAREN) 1 % GEL Apply 1 application topically daily as needed (pain).     diltiazem (CARDIZEM) 30 MG tablet Take 1 Tablet Every 4 Hours As Needed For HR >100 and Top BP >100 45 tablet 1   ELIQUIS 5 MG TABS tablet TAKE 1 TABLET(5 MG) BY MOUTH TWICE DAILY 60 tablet 12   Evolocumab (REPATHA SURECLICK) 938 MG/ML SOAJ Inject 140 mg into the skin every 14 (fourteen) days. 2 mL 11   metoprolol succinate (TOPROL-XL) 25 MG 24 hr tablet Take one tablet by mouth daily daily in the morning and 0.5 tablet by mouth daily in the evening. 135 tablet 3   vitamin C (ASCORBIC ACID) 250 MG tablet Take 500 mg by mouth daily.     No facility-administered medications prior to visit.    Allergies:   Perflutren, Atorvastatin, Other, Pravastatin, Sulfa antibiotics, and Sulfamethoxazole   Social History   Tobacco Use   Smoking status: Never   Smokeless tobacco: Never  Vaping Use   Vaping Use: Never used  Substance Use Topics   Alcohol use: No   Drug use: No     Review of Systems  Constitutional:  Positive for chills, fever and malaise/fatigue.  HENT:  Positive for congestion and sore throat. Negative for ear pain.   Respiratory:  Positive for cough. Negative for shortness of breath.   Cardiovascular:  Negative for chest pain and palpitations.  Gastrointestinal:  Positive for diarrhea and nausea. Negative for abdominal pain, constipation and vomiting.  Genitourinary:  Positive for dysuria, flank pain and frequency.  Musculoskeletal:  Positive for back pain.   Neurological:  Positive for dizziness and headaches.  Psychiatric/Behavioral:  Negative for memory loss.     Labs/Other Tests and Data Reviewed:    Recent Labs: 04/26/2021: Magnesium 2.1 08/23/2021: ALT 17 10/04/2021: BUN 13; Creatinine, Ser 0.66; Hemoglobin 14.6; Platelets 170; Potassium 4.4; Sodium 142   Recent Lipid Panel Lab Results  Component Value Date/Time   CHOL 121 08/23/2021 08:45 AM   TRIG 108 08/23/2021 08:45 AM   HDL 51 08/23/2021 08:45 AM   CHOLHDL 2.4 08/23/2021 08:45 AM   LDLCALC 50 08/23/2021 08:45 AM    Wt Readings from Last 3 Encounters:  12/25/21 242 lb (109.8 kg)  12/11/21 242 lb (109.8 kg)  11/01/21 241 lb 9.6 oz (109.6 kg)     Objective:    Vital Signs:  BP 133/69    Pulse  60    Temp (!) 102 F (38.9 C)    Ht 5\' 5"  (1.651 m)    Wt 242 lb (109.8 kg)    LMP 02/25/2015 (Approximate)    BMI 40.27 kg/m    Physical Exam reviewed  ASSESSMENT & PLAN:    Diagnoses and all orders for this visit: Cystitis -     doxycycline (VIBRA-TABS) 100 MG tablet; Take 1 tablet (100 mg total) by mouth 2 (two) times daily. Patient has cystitis with fever and back pain use doxycycline COVID -     nirmatrelvir/ritonavir EUA (PAXLOVID) 20 x 150 MG & 10 x 100MG  TABS; Take 3 tablets by mouth 2 (two) times daily for 5 days. (Take nirmatrelvir 150 mg two tablets twice daily for 5 days and ritonavir 100 mg one tablet twice daily for 5 days) Patient GFR is 93 Patient has Covid and > 65, treat with paxlovid  .       COVID-19 Education: The signs and symptoms of COVID-19 were discussed with the patient and how to seek care for testing (follow up with PCP or arrange E-visit). The importance of social distancing was discussed today.   I spent 20 minutes dedicated to the care of this patient on the date of this encounter to include face-to-face time with the patient, as well as:   Follow Up:  In Person prn  Signed, Reinaldo Meeker, MD  12/25/2021 9:16 AM    [place]

## 2022-02-05 ENCOUNTER — Encounter: Payer: Self-pay | Admitting: Cardiology

## 2022-02-05 ENCOUNTER — Ambulatory Visit: Payer: PPO | Admitting: Cardiology

## 2022-02-05 ENCOUNTER — Other Ambulatory Visit: Payer: Self-pay

## 2022-02-05 VITALS — BP 142/72 | HR 72 | Ht 65.0 in | Wt 242.6 lb

## 2022-02-05 DIAGNOSIS — D6869 Other thrombophilia: Secondary | ICD-10-CM

## 2022-02-05 DIAGNOSIS — I4819 Other persistent atrial fibrillation: Secondary | ICD-10-CM

## 2022-02-05 NOTE — Patient Instructions (Addendum)
Medication Instructions:  ?NO CHANGES ?*If you need a refill on your cardiac medications before your next appointment, please call your pharmacy* ? ? ?Lab Work: ?NONE ?If you have labs (blood work) drawn today and your tests are completely normal, you will receive your results only by: ?MyChart Message (if you have MyChart) OR ?A paper copy in the mail ?If you have any lab test that is abnormal or we need to change your treatment, we will call you to review the results. ? ? ?Testing/Procedures: ?NONE ? ? ?Follow-Up: ?At [facility], you and your health needs are our priority.  As part of our continuing mission to provide you with exceptional heart care, we have created designated Provider Care Teams.  These Care Teams include your primary Cardiologist (physician) and Advanced Practice Providers (APPs -  Physician Assistants and Nurse Practitioners) who all work together to provide you with the care you need, when you need it. ? ?We recommend signing up for the patient portal called "MyChart".  Sign up information is provided on this After Visit Summary.  MyChart is used to connect with patients for Virtual Visits (Telemedicine).  Patients are able to view lab/test results, encounter notes, upcoming appointments, etc.  Non-urgent messages can be sent to your provider as well.   ?To learn more about what you can do with MyChart, go to [email or web address].   ? ?Your next appointment:   ?3 month(s) ? ?The format for your next appointment:   ?In Person ? ?Provider: ?DR Curt Bears ?   ?DR CAMNITZ  ? ? ?Other Instructions ?NONE ?

## 2022-02-05 NOTE — Progress Notes (Signed)
Electrophysiology Office Note   Date:  02/05/2022   ID:  Teresa Blair, Teresa Blair 09/20/1951, MRN [id number]  PCP:  Lillard Anes, MD  Cardiologist:  [place] Primary Electrophysiologist:  Tarquin Welcher Meredith Leeds, MD    Chief Complaint: AF   History of Present Illness: Teresa Blair is a 72 y.o. female who is being seen today for the evaluation of AF at the request of Lillard Anes,*. Presenting today for electrophysiology evaluation.  She has a history significant for persistent atrial fibrillation, CVA, hypertension, obesity, obstructive sleep apnea.  She was hospitalized October 2021 with a CVA and new onset atrial fibrillation.  November 2021 she was scheduled for a TEE cardioversion but was found to have a left atrial appendage thrombus.  She had another attempt at TEE and cardioversion but did not convert to sinus rhythm.  She is now status post atrial fibrillation ablation 10/17/2021.  Today, denies symptoms of palpitations, chest pain, shortness of breath, orthopnea, PND, lower extremity edema, claudication, dizziness, presyncope, syncope, bleeding, or neurologic sequela. The patient is tolerating medications without difficulties.  Since her ablation she has done well.  She has had no chest pain or shortness of breath.  She is able do all of her daily activities without restriction.  She is overall happy with her control.  She has had minimal palpitations.   Past Medical History:  Diagnosis Date   A-fib [facility])    Anxiety 12/01/2020   Asthma    as a child   Atrial fibrillation ([place]) 09/16/2020   Chronic anticoagulation 12/01/2020   Closed fracture of right acetabulum ([place]) 09/03/2011   Enthesopathy of hip region 09/03/2011   Gestational diabetes    Headache    History of CVA (cerebrovascular accident) 09/16/2020   Hypercholesterolemia    Hypertension    Kidney stones    Mixed hyperlipidemia 09/16/2020   Morbid obesity ([place]) 09/16/2020   Obesity    Osteoarthritis     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   Primary hypertension 09/16/2020   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   Right hip pain 09/03/2011   Scleroderma, localized    Sequelae, 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   Stroke ([place]) 09/12/2020   left side weakness/speech affected   Wears glasses    Past Surgical History:  Procedure Laterality Date   ATRIAL FIBRILLATION ABLATION N/A 10/17/2021   Procedure: ATRIAL FIBRILLATION ABLATION;  Surgeon: Constance Haw, MD;  Location: [place] CV LAB;  Service: Cardiovascular;  Laterality: N/A;   BUBBLE STUDY  05/04/2021   Procedure: BUBBLE STUDY;  Surgeon: Elouise Munroe, MD;  Location: [place];  Service: Cardiovascular;;   CARDIOVERSION N/A 05/04/2021   Procedure: CARDIOVERSION;  Surgeon: Elouise Munroe, MD;  Location: [facility] ENDOSCOPY;  Service: Cardiovascular;  Laterality: N/A;   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 06/08/2015   Procedure: CONVERSION TO TOTAL HIP, ANTERIOR TOTAL HIP;  Surgeon: Kathryne Hitch, MD;  Location: [place];  Service: Orthopedics;  Laterality: Right;   FRACTURE SURGERY     right hip   TEE WITHOUT CARDIOVERSION N/A 05/04/2021   Procedure: TRANSESOPHAGEAL ECHOCARDIOGRAM (TEE);  Surgeon: Elouise Munroe, MD;  Location: [facility]OSCOPY;  Service: Cardiovascular;  Laterality: N/A;   TOTAL HIP ARTHROPLASTY Right 06/08/2015   TUBAL LIGATION     WISDOM TOOTH EXTRACTION       Current Outpatient Medications  Medication Sig Dispense Refill   conjugated estrogens (PREMARIN) vaginal cream Place 1 Applicatorful vaginally daily. 42.5 g 12   diclofenac  Sodium (VOLTAREN) 1 % GEL Apply 1 application topically daily as needed (pain).     diltiazem (CARDIZEM) 30 MG tablet Take 1 Tablet Every 4 Hours As Needed For HR >100 and Top BP >100 45 tablet 1   ELIQUIS 5 MG TABS tablet TAKE 1 TABLET(5 MG) BY MOUTH TWICE DAILY 60 tablet 12   Evolocumab (REPATHA SURECLICK) 371 MG/ML SOAJ Inject 140 mg into the skin every 14  (fourteen) days. 2 mL 11   metoprolol succinate (TOPROL-XL) 25 MG 24 hr tablet Take one tablet by mouth daily daily in the morning and 0.5 tablet by mouth daily in the evening. 135 tablet 3   vitamin C (ASCORBIC ACID) 250 MG tablet Take 500 mg by mouth daily.     No current facility-administered medications for this visit.    Allergies:   Perflutren, Atorvastatin, Other, Pravastatin, Sulfa antibiotics, and Sulfamethoxazole   Social History:  The patient  reports that she has never smoked. She has never used smokeless tobacco. She reports that she does not drink alcohol and does not use drugs.   Family History:  The patient's family history includes Cancer in her father and mother; Hypertension in an other family member.    ROS:  Please see the history of present illness.   Otherwise, review of systems is positive for none.   All other systems are reviewed and negative.   PHYSICAL EXAM: VS:  BP (!) 142/72    Pulse 72    Ht '5\' 5"'$  (1.651 m)    Wt 242 lb 9.6 oz (110 kg)    LMP 02/25/2015 (Approximate)    SpO2 95%    BMI 40.37 kg/m  , BMI Body mass index is 40.37 kg/m. GEN: Well nourished, well developed, in no acute distress  HEENT: normal  Neck: no JVD, carotid bruits, or masses Cardiac: RRR; no murmurs, rubs, or gallops,no edema  Respiratory:  clear to auscultation bilaterally, normal work of breathing GI: soft, nontender, nondistended, + BS MS: no deformity or atrophy  Skin: warm and dry Neuro:  Strength and sensation are intact Psych: euthymic mood, full affect  EKG:  EKG is ordered today. Personal review of the ekg ordered shows sinus rhythm, PVC, rate 72  Recent Labs: 04/26/2021: Magnesium 2.1 08/23/2021: ALT 17 10/04/2021: BUN 13; Creatinine, Ser 0.66; Hemoglobin 14.6; Platelets 170; Potassium 4.4; Sodium 142    Lipid Panel     Component Value Date/Time   CHOL 121 08/23/2021 0845   TRIG 108 08/23/2021 0845   HDL 51 08/23/2021 0845   CHOLHDL 2.4 08/23/2021 0845   LDLCALC  50 08/23/2021 0845     Wt Readings from Last 3 Encounters:  02/05/22 242 lb 9.6 oz (110 kg)  12/25/21 242 lb (109.8 kg)  12/11/21 242 lb (109.8 kg)      Other studies Reviewed: Additional studies/ records that were reviewed today include: TEE 05/17/21  Review of the above records today demonstrates:   1. Left ventricular ejection fraction, by estimation, is 60 to 65%. The  left ventricle has normal function.   2. Right ventricular systolic function is normal. The right ventricular  size is normal.   3. Left atrial size was mildly dilated. No left atrial/left atrial  appendage thrombus was detected. The LAA emptying velocity was 39 cm/s.   4. The mitral valve is normal in structure. Trivial mitral valve  regurgitation.   5. The aortic valve is tricuspid. Aortic valve regurgitation is not  visualized. No aortic stenosis is present.  6. There is mild (Grade II) atheroma plaque involving the transverse  aorta.   7. Evidence of atrial level shunting detected by color flow Doppler.  There is a small patent foramen ovale with predominantly left to right  shunting across the atrial septum.    ASSESSMENT AND PLAN:  1.  Persistent atrial fibrillation: Currently on Eliquis 5 mg twice daily.  CHA2DS2-VASc of at least 4.  She is now status post ablation 10/17/2021.  She fortunately remains in normal rhythm.  She is overall happy with her control.  No changes.  2.  Obstructive sleep apnea: CPAP compliance encouraged   3.  Hypertension: Mildly elevated today but usually well controlled  4.  Morbid obesity: Body mass index is 40.37 kg/m. Diet and exercise encouraged.  She is lost 20 pounds since her ablation.  5.  Secondary hypercoagulable state: Currently on Eliquis for atrial fibrillation as above  Current medicines are reviewed at length with the patient today.   The patient does not have concerns regarding her medicines.  The following changes were made today: None  Labs/ tests  ordered today include:  Orders Placed This Encounter  Procedures   EKG 12-Lead     Disposition:   FU with Andrianna Manalang 3 months  Signed, Epiphany Seltzer Meredith Leeds, MD  02/05/2022 11:45 AM     [facility] [address] [place] [address] [phone] (office) [phone] (fax)

## 2022-02-13 DIAGNOSIS — L821 Other seborrheic keratosis: Secondary | ICD-10-CM | POA: Diagnosis not present

## 2022-02-13 DIAGNOSIS — D2239 Melanocytic nevi of other parts of face: Secondary | ICD-10-CM | POA: Diagnosis not present

## 2022-02-13 DIAGNOSIS — D225 Melanocytic nevi of trunk: Secondary | ICD-10-CM | POA: Diagnosis not present

## 2022-02-13 DIAGNOSIS — D485 Neoplasm of uncertain behavior of skin: Secondary | ICD-10-CM | POA: Diagnosis not present

## 2022-02-13 DIAGNOSIS — L814 Other melanin hyperpigmentation: Secondary | ICD-10-CM | POA: Diagnosis not present

## 2022-02-13 DIAGNOSIS — L820 Inflamed seborrheic keratosis: Secondary | ICD-10-CM | POA: Diagnosis not present

## 2022-02-20 NOTE — Progress Notes (Deleted)
? ?Subjective:  ?Patient ID: Teresa Blair, female    DOB: 1951-06-07  Age: 71 y.o. MRN: [id number] ? ?No chief complaint on file. ? ? ?HPI ?Afib: Patient is taking Eliquis 5 mg  twice a day, Diltiazem 30 mg 4 hours as needed. ? ?Patient presents for follow up of hypertension.  Patient tolerating Metoprolol 25 mg twice a day well without side effects.  Patient was diagnosed with hypertension  and has been treated for hypertension for 5 years.Patient is working on maintaining diet and exercise regimen and follows up as directed.  ? ?Patient presents with hyperlipidemia.  Compliance with treatment has been good; patient takes medicines as directed, maintains low cholesterol diet, follows up as directed, and maintains exercise regimen.  Patient is using Repatha 140 mg every 14 days without problems.  ?Current Outpatient Medications on File Prior to Visit  ?Medication Sig Dispense Refill  ? conjugated estrogens (PREMARIN) vaginal cream Place 1 Applicatorful vaginally daily. 42.5 g 12  ? diclofenac Sodium (VOLTAREN) 1 % GEL Apply 1 application topically daily as needed (pain).    ? diltiazem (CARDIZEM) 30 MG tablet Take 1 Tablet Every 4 Hours As Needed For HR >100 and Top BP >100 45 tablet 1  ? ELIQUIS 5 MG TABS tablet TAKE 1 TABLET(5 MG) BY MOUTH TWICE DAILY 60 tablet 12  ? Evolocumab (REPATHA SURECLICK) 354 MG/ML SOAJ Inject 140 mg into the skin every 14 (fourteen) days. 2 mL 11  ? metoprolol succinate (TOPROL-XL) 25 MG 24 hr tablet Take one tablet by mouth daily daily in the morning and 0.5 tablet by mouth daily in the evening. 135 tablet 3  ? vitamin C (ASCORBIC ACID) 250 MG tablet Take 500 mg by mouth daily.    ? ?No current facility-administered medications on file prior to visit.  ? ?Past Medical History:  ?Diagnosis Date  ? A-fib ([place])   ? Anxiety 12/01/2020  ? Asthma   ? as a child  ? Atrial fibrillation ([place]) 09/16/2020  ? Chronic anticoagulation 12/01/2020  ? Closed fracture of right acetabulum ([place]) 09/03/2011   ? Enthesopathy of hip region 09/03/2011  ? Gestational diabetes   ? Headache   ? History of CVA (cerebrovascular accident) 09/16/2020  ? Hypercholesterolemia   ? Hypertension   ? Kidney stones   ? Mixed hyperlipidemia 09/16/2020  ? Morbid obesity ([place]) 09/16/2020  ? Obesity   ? Osteoarthritis   ? 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  ? Primary hypertension 09/16/2020  ? 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  ? Right hip pain 09/03/2011  ? Scleroderma, localized   ? 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 Stroke ([place]) 09/12/2020  ? left side weakness/speech affected  ? Wears glasses   ? ?Past Surgical History:  ?Procedure Laterality Date  ? ATRIAL FIBRILLATION ABLATION N/A 10/17/2021  ? Procedure: ATRIAL FIBRILLATION ABLATION;  Surgeon: Constance Haw, MD;  Location: [place] CV LAB;  Service: Cardiovascular;  Laterality: N/A;  ? BUBBLE STUDY  05/04/2021  ? Procedure: BUBBLE STUDY;  Surgeon: Elouise Munroe, MD;  Location: [place];  Service: Cardiovascular;;  ? CARDIOVERSION N/A 05/04/2021  ? Procedure: CARDIOVERSION;  Surgeon: Elouise Munroe, MD;  Location: [facility] ENDOSCOPY;  Service: Cardiovascular;  Laterality: N/A;  ? 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 06/08/2015  ? Procedure: CONVERSION TO TOTAL HIP, ANTERIOR TOTAL HIP;  Surgeon: Kathryne Hitch, MD;  Location: [place];  Service: Orthopedics;  Laterality: Right;  ? FRACTURE SURGERY    ? right hip  ?  TEE WITHOUT CARDIOVERSION N/A 05/04/2021  ? Procedure: TRANSESOPHAGEAL ECHOCARDIOGRAM (TEE);  Surgeon: Elouise Munroe, MD;  Location: [facility] ENDOSCOPY;  Service: Cardiovascular;  Laterality: N/A;  ? TOTAL HIP ARTHROPLASTY Right 06/08/2015  ? TUBAL LIGATION    ? WISDOM TOOTH EXTRACTION    ?  ?Family History  ?Problem Relation Age of Onset  ? Cancer Mother   ? Cancer Father   ? Hypertension Other   ? ?Social History  ? ?Socioeconomic History  ? Marital status: Divorced  ?  Spouse name: Not on file  ? Number of children: 3  ? Years of  education: Not on file  ? Highest education level: Not on file  ?Occupational History  ?  Comment: full time  ?Tobacco Use  ? Smoking status: Never  ? Smokeless tobacco: Never  ?Vaping Use  ? Vaping Use: Never used  ?Substance and Sexual Activity  ? Alcohol use: No  ? Drug use: No  ? Sexual activity: Not Currently  ?Other Topics Concern  ? Not on file  ?Social History Narrative  ? Lives alone  ? Right Handed  ? Drinks no caffeine  ? ?Social Determinants of Health  ? ?Financial Resource Strain: Not on file  ?Food Insecurity: Not on file  ?Transportation Needs: Not on file  ?Physical Activity: Not on file  ?Stress: Not on file  ?Social Connections: Not on file  ? ? ?Review of Systems ? ? ?Objective:  ?LMP 02/25/2015 (Approximate)  ? ? ?  02/05/2022  ? 11:30 AM 12/25/2021  ?  9:08 AM 12/11/2021  ?  2:11 PM  ?BP/Weight  ?Systolic BP 341 937 902  ?Diastolic BP 72 69 70  ?Wt. (Lbs) 242.6 242 242  ?BMI 40.37 kg/m2 40.27 kg/m2 40.27 kg/m2  ? ? ?Physical Exam ? ?Diabetic Foot Exam - Simple   ?No data filed ?  ?  ? ?Lab Results  ?Component Value Date  ? WBC 6.6 10/04/2021  ? HGB 14.6 10/04/2021  ? HCT 45.4 10/04/2021  ? PLT 170 10/04/2021  ? GLUCOSE 89 10/04/2021  ? CHOL 121 08/23/2021  ? TRIG 108 08/23/2021  ? HDL 51 08/23/2021  ? [place] 50 08/23/2021  ? ALT 17 08/23/2021  ? AST 15 08/23/2021  ? NA 142 10/04/2021  ? K 4.4 10/04/2021  ? CL 104 10/04/2021  ? CREATININE 0.66 10/04/2021  ? BUN 13 10/04/2021  ? CO2 24 10/04/2021  ? INR 1.01 05/26/2015  ? ? ? ? ?Assessment & Plan:  ? ?Problem List Items Addressed This Visit   ? ?  ? Cardiovascular and Mediastinum  ? Atrial fibrillation ([place]) - Primary  ? Hypertension  ?  ? Respiratory  ? OSA (obstructive sleep apnea)  ?  ? Hematopoietic and Hemostatic  ? Acquired thrombophilia ([place])  ?  ? Other  ? Mixed hyperlipidemia  ? Anxiety  ?. ? ?No orders of the defined types were placed in this encounter. ? ? ?No orders of the defined types were placed in this encounter. ?  ? ?Follow-up: No  follow-ups on file. ? ?An After Visit Summary was printed and given to the patient. ? ?Reinaldo Meeker, MD ?[place] ?[phone] ?

## 2022-02-21 ENCOUNTER — Ambulatory Visit: Payer: PPO | Admitting: Legal Medicine

## 2022-02-21 DIAGNOSIS — F419 Anxiety disorder, unspecified: Secondary | ICD-10-CM

## 2022-02-21 DIAGNOSIS — G4733 Obstructive sleep apnea (adult) (pediatric): Secondary | ICD-10-CM

## 2022-02-21 DIAGNOSIS — D6869 Other thrombophilia: Secondary | ICD-10-CM

## 2022-02-21 DIAGNOSIS — I10 Essential (primary) hypertension: Secondary | ICD-10-CM

## 2022-02-21 DIAGNOSIS — E782 Mixed hyperlipidemia: Secondary | ICD-10-CM

## 2022-02-21 DIAGNOSIS — I4819 Other persistent atrial fibrillation: Secondary | ICD-10-CM

## 2022-04-20 ENCOUNTER — Telehealth: Payer: Self-pay

## 2022-04-20 NOTE — Telephone Encounter (Signed)
Patient requesting update of DMV paperwork. She dropped off approximately one month ago.   Royce Macadamia, [place] 04/20/22 9:26 AM

## 2022-05-14 ENCOUNTER — Ambulatory Visit: Payer: PPO | Admitting: Cardiology

## 2022-05-17 NOTE — Telephone Encounter (Signed)
Patient was notified to come to pick up.

## 2022-05-21 ENCOUNTER — Other Ambulatory Visit: Payer: Self-pay | Admitting: Cardiology

## 2022-08-20 ENCOUNTER — Ambulatory Visit: Payer: PPO | Attending: Cardiology | Admitting: Cardiology

## 2022-08-20 ENCOUNTER — Encounter: Payer: Self-pay | Admitting: Cardiology

## 2022-08-20 VITALS — BP 158/90 | HR 75 | Ht 65.0 in | Wt 249.0 lb

## 2022-08-20 DIAGNOSIS — I4819 Other persistent atrial fibrillation: Secondary | ICD-10-CM

## 2022-08-20 DIAGNOSIS — D6869 Other thrombophilia: Secondary | ICD-10-CM

## 2022-08-20 NOTE — Progress Notes (Signed)
Electrophysiology Office Note   Date:  08/20/2022   ID:  Adley, Castello 1951/10/12, MRN [id number]  PCP:  Lillard Anes, MD  Cardiologist:  [place] Primary Electrophysiologist:  Vincent Ehrler Meredith Leeds, MD    Chief Complaint: AF   History of Present Illness: Teresa Blair is a 71 y.o. female who is being seen today for the evaluation of AF at the request of Lillard Anes,*. Presenting today for electrophysiology evaluation.  She has a history of persistent atrial fibrillation, CVA, hypertension, obesity, obstructive sleep apnea.  She was hospitalized 10/15/2020 with CVA new onset atrial fibrillation.  November 2021 she was scheduled for TEE cardioversion but was found to have a left atrial appendage thrombus.  Other than a TEE and cardioversion which did not sinus rhythm.  She is status post atrial fibrillation ablation 10/15/2021.  Today, denies symptoms of palpitations, chest pain, shortness of breath, orthopnea, PND, lower extremity edema, claudication, dizziness, presyncope, syncope, bleeding, or neurologic sequela. The patient is tolerating medications without difficulties.  She has had no further episodes of atrial fibrillation.  She is overall feeling well.  She has no major complaints at this time.  She is taking a trip to [place] with her school for an academic competition.  She is quite excited about the trip.  Otherwise she has no complaints.   Past Medical History:  Diagnosis Date   A-fib [facility])    Anxiety 12/01/2020   Asthma    as a child   Atrial fibrillation ([place]) 09/16/2020   Chronic anticoagulation 12/01/2020   Closed fracture of right acetabulum ([place]) 09/03/2011   Enthesopathy of hip region 09/03/2011   Gestational diabetes    Headache    History of CVA (cerebrovascular accident) 09/16/2020   Hypercholesterolemia    Hypertension    Kidney stones    Mixed hyperlipidemia 09/16/2020   Morbid obesity ([place]) 09/16/2020   Obesity     Osteoarthritis    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   Primary hypertension 09/16/2020   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   Right hip pain 09/03/2011   Scleroderma, localized    Sequelae, 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   Stroke ([place]) 09/12/2020   left side weakness/speech affected   Wears glasses    Past Surgical History:  Procedure Laterality Date   ATRIAL FIBRILLATION ABLATION N/A 10/17/2021   Procedure: ATRIAL FIBRILLATION ABLATION;  Surgeon: Constance Haw, MD;  Location: [place] CV LAB;  Service: Cardiovascular;  Laterality: N/A;   BUBBLE STUDY  05/04/2021   Procedure: BUBBLE STUDY;  Surgeon: Elouise Munroe, MD;  Location: [place];  Service: Cardiovascular;;   CARDIOVERSION N/A 05/04/2021   Procedure: CARDIOVERSION;  Surgeon: Elouise Munroe, MD;  Location: [facility] ENDOSCOPY;  Service: Cardiovascular;  Laterality: N/A;   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 06/08/2015   Procedure: CONVERSION TO TOTAL HIP, ANTERIOR TOTAL HIP;  Surgeon: Kathryne Hitch, MD;  Location: [place];  Service: Orthopedics;  Laterality: Right;   FRACTURE SURGERY     right hip   TEE WITHOUT CARDIOVERSION N/A 05/04/2021   Procedure: TRANSESOPHAGEAL ECHOCARDIOGRAM (TEE);  Surgeon: Elouise Munroe, MD;  Location: [facility] ENDOSCOPY;  Service: Cardiovascular;  Laterality: N/A;   TOTAL HIP ARTHROPLASTY Right 06/08/2015   TUBAL LIGATION     WISDOM TOOTH EXTRACTION       Current Outpatient Medications  Medication Sig Dispense Refill   conjugated estrogens (PREMARIN) vaginal cream Place 1 Applicatorful vaginally daily. 42.5 g 12   diclofenac  Sodium (VOLTAREN) 1 % GEL Apply 1 application topically daily as needed (pain).     ELIQUIS 5 MG TABS tablet TAKE 1 TABLET(5 MG) BY MOUTH TWICE DAILY 60 tablet 12   metoprolol succinate (TOPROL-XL) 25 MG 24 hr tablet Take one tablet by mouth daily daily in the morning and 0.5 tablet by mouth daily in the evening. 135 tablet 3   REPATHA  SURECLICK 626 MG/ML SOAJ ADMINISTER 1 ML UNDER THE SKIN EVERY 14 DAYS 2 mL 11   vitamin C (ASCORBIC ACID) 250 MG tablet Take 500 mg by mouth daily.     diltiazem (CARDIZEM) 30 MG tablet Take 1 Tablet Every 4 Hours As Needed For HR >100 and Top BP >100 (Patient not taking: Reported on 08/20/2022) 45 tablet 1   No current facility-administered medications for this visit.    Allergies:   Perflutren, Atorvastatin, Other, Pravastatin, Sulfa antibiotics, and Sulfamethoxazole   Social History:  The patient  reports that she has never smoked. She has never used smokeless tobacco. She reports that she does not drink alcohol and does not use drugs.   Family History:  The patient's family history includes Cancer in her father and mother; Hypertension in an other family member.    ROS:  Please see the history of present illness.   ROS:  Please see the history of present illness.   Otherwise, review of systems is positive for none.   All other systems are reviewed and negative.   PHYSICAL EXAM: VS:  BP (!) 158/90   Pulse 75   Ht '5\' 5"'$  (1.651 m)   Wt 249 lb (112.9 kg)   LMP 02/25/2015 (Approximate)   SpO2 95%   BMI 41.44 kg/m  , BMI Body mass index is 41.44 kg/m. GEN: Well nourished, well developed, in no acute distress  HEENT: normal  Neck: no JVD, carotid bruits, or masses Cardiac: RRR; no murmurs, rubs, or gallops,no edema  Respiratory:  clear to auscultation bilaterally, normal work of breathing GI: soft, nontender, nondistended, + BS MS: no deformity or atrophy  Skin: warm and dry Neuro:  Strength and sensation are intact Psych: euthymic mood, full affect  EKG:  EKG is ordered today. Personal review of the ekg ordered shows sinus rhythm  Recent Labs: 08/23/2021: ALT 17 10/04/2021: BUN 13; Creatinine, Ser 0.66; Hemoglobin 14.6; Platelets 170; Potassium 4.4; Sodium 142    Lipid Panel     Component Value Date/Time   CHOL 121 08/23/2021 0845   TRIG 108 08/23/2021 0845   HDL 51  08/23/2021 0845   CHOLHDL 2.4 08/23/2021 0845   LDLCALC 50 08/23/2021 0845     Wt Readings from Last 3 Encounters:  08/20/22 249 lb (112.9 kg)  02/05/22 242 lb 9.6 oz (110 kg)  12/25/21 242 lb (109.8 kg)      Other studies Reviewed: Additional studies/ records that were reviewed today include: TEE 05/17/21  Review of the above records today demonstrates:   1. Left ventricular ejection fraction, by estimation, is 60 to 65%. The  left ventricle has normal function.   2. Right ventricular systolic function is normal. The right ventricular  size is normal.   3. Left atrial size was mildly dilated. No left atrial/left atrial  appendage thrombus was detected. The LAA emptying velocity was 39 cm/s.   4. The mitral valve is normal in structure. Trivial mitral valve  regurgitation.   5. The aortic valve is tricuspid. Aortic valve regurgitation is not  visualized. No aortic stenosis is  present.   6. There is mild (Grade II) atheroma plaque involving the transverse  aorta.   7. Evidence of atrial level shunting detected by color flow Doppler.  There is a small patent foramen ovale with predominantly left to right  shunting across the atrial septum.    ASSESSMENT AND PLAN:  1.  Persistent atrial fibrillation: Currently on Eliquis 5 mg twice daily.  CHA2DS2-VASc at least 4.  Status post ablation 10/17/2021.  Remains in normal rhythm.  No changes.  2.  Obstructive sleep apnea: CPAP compliance encouraged  3.  Hypertension: Elevated today and elevated on recheck.  Less than 130/80 at home.  No changes at this time.  4.  Morbid obesity: Diet and exercise encouraged Body mass index is 41.44 kg/m.  5.  Second hypercoagulable state: Currently on Eliquis for atrial fibrillation as above  6.  Hyperlipidemia: Currently on Repatha.  Current medicines are reviewed at length with the patient today.   The patient does not have concerns regarding her medicines.  The following changes were made  today: None  Labs/ tests ordered today include:  Orders Placed This Encounter  Procedures   EKG 12-Lead     Disposition:   FU with Gatlyn Lipari 12 months  Signed, Jaselynn Tamas Meredith Leeds, MD  08/20/2022 9:11 AM     [facility] [address] [place] [address] [phone] (office) [phone] (fax)

## 2022-08-20 NOTE — Patient Instructions (Signed)
Medication Instructions:  Your physician recommends that you continue on your current medications as directed. Please refer to the Current Medication list given to you today.  *If you need a refill on your cardiac medications before your next appointment, please call your pharmacy*   Lab Work: None ordered   Testing/Procedures: None ordered   Follow-Up: At [facility], you and your health needs are our priority.  As part of our continuing mission to provide you with exceptional heart care, we have created designated Provider Care Teams.  These Care Teams include your primary Cardiologist (physician) and Advanced Practice Providers (APPs -  Physician Assistants and Nurse Practitioners) who all work together to provide you with the care you need, when you need it.  Your next appointment:   1 year(s)  The format for your next appointment:   In Person  Provider:   Will Camnitz, MD    Thank you for choosing [facility]!!   Fiorella Hanahan, RN [phone]  Other Instructions   Important Information About Sugar           

## 2022-08-21 ENCOUNTER — Telehealth: Payer: Self-pay | Admitting: *Deleted

## 2022-08-21 NOTE — Telephone Encounter (Signed)
Received a call from [facility] @ [facility] to inform me that she will be transferring the CPAP request to [place]. Patient is requesting to get set up this week and they have nothing available.

## 2022-09-09 DIAGNOSIS — R300 Dysuria: Secondary | ICD-10-CM | POA: Diagnosis not present

## 2022-09-10 DIAGNOSIS — R300 Dysuria: Secondary | ICD-10-CM | POA: Diagnosis not present

## 2022-11-07 ENCOUNTER — Other Ambulatory Visit: Payer: Self-pay | Admitting: Cardiology

## 2022-11-14 DIAGNOSIS — G4733 Obstructive sleep apnea (adult) (pediatric): Secondary | ICD-10-CM | POA: Diagnosis not present

## 2022-11-22 ENCOUNTER — Encounter: Payer: Self-pay | Admitting: Cardiology

## 2022-11-23 MED ORDER — METOPROLOL SUCCINATE ER 25 MG PO TB24: ORAL_TABLET | ORAL | 2 refills | Status: DC

## 2022-12-04 ENCOUNTER — Other Ambulatory Visit: Payer: Self-pay | Admitting: Cardiology

## 2022-12-04 DIAGNOSIS — I4819 Other persistent atrial fibrillation: Secondary | ICD-10-CM

## 2022-12-05 NOTE — Telephone Encounter (Signed)
Prescription refill request for Eliquis received. Indication: Afib  Last office visit: 08/20/22 ([place])  Scr: 0.66 (10/04/21)  Age: 71 Weight: 112.9kg  Labs overdue. Called pt and made her aware of overdue labs. Pt stated she would go to [place] office tomorrow afternoon to have labs drawn. Orders placed. Refill sent to prevent missed doses.

## 2022-12-05 NOTE — Telephone Encounter (Signed)
Overdue for labs

## 2022-12-06 DIAGNOSIS — I4819 Other persistent atrial fibrillation: Secondary | ICD-10-CM | POA: Diagnosis not present

## 2022-12-06 LAB — CBC
Hematocrit: 39 % (ref 34.0–46.6)
Hemoglobin: 13.5 g/dL (ref 11.1–15.9)
MCH: 29.2 pg (ref 26.6–33.0)
MCHC: 34.6 g/dL (ref 31.5–35.7)
MCV: 84 fL (ref 79–97)
Platelets: 183 10*3/uL (ref 150–450)
RBC: 4.63 x10E6/uL (ref 3.77–5.28)
RDW: 11.9 % (ref 11.7–15.4)
WBC: 6.8 10*3/uL (ref 3.4–10.8)

## 2022-12-07 LAB — BASIC METABOLIC PANEL
BUN/Creatinine Ratio: 22 (ref 12–28)
BUN: 11 mg/dL (ref 8–27)
CO2: 25 mmol/L (ref 20–29)
Calcium: 9.1 mg/dL (ref 8.7–10.3)
Chloride: 106 mmol/L (ref 96–106)
Creatinine, Ser: 0.5 mg/dL — ABNORMAL LOW (ref 0.57–1.00)
Glucose: 103 mg/dL — ABNORMAL HIGH (ref 70–99)
Potassium: 4.3 mmol/L (ref 3.5–5.2)
Sodium: 142 mmol/L (ref 134–144)
eGFR: 101 mL/min/{1.73_m2} (ref >59)

## 2022-12-15 DIAGNOSIS — G4733 Obstructive sleep apnea (adult) (pediatric): Secondary | ICD-10-CM | POA: Diagnosis not present

## 2022-12-24 ENCOUNTER — Encounter: Payer: Self-pay | Admitting: Cardiology

## 2022-12-28 NOTE — Telephone Encounter (Signed)
Left message to call back  

## 2023-01-01 NOTE — Telephone Encounter (Signed)
Left message to call back  

## 2023-01-01 NOTE — Telephone Encounter (Signed)
Patient called in requesting a message be left for Sherri to call her back. Advised patient Venida Jarvis tried calling her on 02/02. Patient stated she did not get the VM and requested callback on her mobile number. Please advise.

## 2023-01-02 ENCOUNTER — Telehealth: Payer: Self-pay | Admitting: Cardiology

## 2023-01-02 ENCOUNTER — Telehealth: Payer: Self-pay

## 2023-01-02 DIAGNOSIS — R9431 Abnormal electrocardiogram [ECG] [EKG]: Secondary | ICD-10-CM | POA: Diagnosis not present

## 2023-01-02 DIAGNOSIS — R079 Chest pain, unspecified: Secondary | ICD-10-CM | POA: Diagnosis not present

## 2023-01-02 DIAGNOSIS — Z8673 Personal history of transient ischemic attack (TIA), and cerebral infarction without residual deficits: Secondary | ICD-10-CM | POA: Diagnosis not present

## 2023-01-02 DIAGNOSIS — I4891 Unspecified atrial fibrillation: Secondary | ICD-10-CM | POA: Diagnosis not present

## 2023-01-02 DIAGNOSIS — I10 Essential (primary) hypertension: Secondary | ICD-10-CM | POA: Diagnosis not present

## 2023-01-02 DIAGNOSIS — Z7901 Long term (current) use of anticoagulants: Secondary | ICD-10-CM | POA: Diagnosis not present

## 2023-01-02 DIAGNOSIS — Z79899 Other long term (current) drug therapy: Secondary | ICD-10-CM | POA: Diagnosis not present

## 2023-01-02 NOTE — Telephone Encounter (Signed)
Patient called and stated that her blood pressure was elevated 160s top number, stated she call her heart doctor and they told her she needs to be seen by her PCP.  I asked patient was she having any symptoms, SOB, tingling of face, arms or hands. Patient stated she was having a little tingling.  Recommend patient go to the hospital and she [facility] I told her that would be fine but a hospital will be able to do blood work but if they feel like she needs to go to hospital they will send her there with her her having the tingling and BP elevated she need to be seen as soon as possible.  Patient verbalized understanding and stated she will go be seen now.

## 2023-01-02 NOTE — Telephone Encounter (Signed)
Left message to call back  

## 2023-01-02 NOTE — Telephone Encounter (Signed)
Spoke to pt, see telephone note. Pt advised to contact PCP

## 2023-01-02 NOTE — Telephone Encounter (Signed)
Pt advised to follow up with her PCP about her blood pressures. Pt agreeable to plan.

## 2023-01-02 NOTE — Telephone Encounter (Signed)
Patient was returning call. Please advise  

## 2023-01-03 ENCOUNTER — Encounter (HOSPITAL_COMMUNITY): Payer: Self-pay | Admitting: *Deleted

## 2023-01-05 DIAGNOSIS — I10 Essential (primary) hypertension: Secondary | ICD-10-CM | POA: Diagnosis not present

## 2023-01-11 ENCOUNTER — Telehealth: Payer: Self-pay | Admitting: Cardiology

## 2023-01-11 ENCOUNTER — Other Ambulatory Visit: Payer: Self-pay | Admitting: Cardiology

## 2023-01-11 DIAGNOSIS — I4819 Other persistent atrial fibrillation: Secondary | ICD-10-CM

## 2023-01-11 NOTE — Telephone Encounter (Signed)
*  STAT* If patient is at the pharmacy, call can be transferred to refill team.   1. Which medications need to be refilled? (please list name of each medication and dose if known)   ELIQUIS 5 MG TABS tablet    2. Which pharmacy/location (including street and city if local pharmacy) is medication to be sent to?  [id number] - [place], [place] - [address] AT [address]    3. Do they need a 30 day or 90 day supply? 30 day   Patient is completely out of medication

## 2023-01-11 NOTE — Telephone Encounter (Signed)
Eliquis 26m refill request received. Patient is 72years old, weight-112.9kg, Crea-0.60 [place] and last seen by Dr. CCurt Bearson 08/20/22. Dose is appropriate based on dosing criteria.   This is a duplicate & sent refill on via previous encounter.

## 2023-01-11 NOTE — Telephone Encounter (Signed)
Eliquis 54m refill request received. Patient is 72years old, weight-112.9kg, Crea-0.60 [place] and last seen by Dr. CCurt Bearson 08/20/22. Dose is appropriate based on dosing criteria. Will send in refill to requested pharmacy

## 2023-01-15 ENCOUNTER — Encounter: Payer: Self-pay | Admitting: Nurse Practitioner

## 2023-01-15 ENCOUNTER — Ambulatory Visit (INDEPENDENT_AMBULATORY_CARE_PROVIDER_SITE_OTHER): Payer: PPO | Admitting: Nurse Practitioner

## 2023-01-15 VITALS — BP 126/60 | HR 68 | Temp 97.3°F | Resp 16 | Ht 65.0 in | Wt 252.0 lb

## 2023-01-15 DIAGNOSIS — I10 Essential (primary) hypertension: Secondary | ICD-10-CM | POA: Diagnosis not present

## 2023-01-15 DIAGNOSIS — E782 Mixed hyperlipidemia: Secondary | ICD-10-CM | POA: Diagnosis not present

## 2023-01-15 DIAGNOSIS — G4733 Obstructive sleep apnea (adult) (pediatric): Secondary | ICD-10-CM | POA: Diagnosis not present

## 2023-01-15 DIAGNOSIS — I4819 Other persistent atrial fibrillation: Secondary | ICD-10-CM | POA: Diagnosis not present

## 2023-01-15 MED ORDER — AMLODIPINE BESYLATE 5 MG PO TABS: 5.0000 mg | ORAL_TABLET | Freq: Every day | ORAL | 1 refills | Status: DC

## 2023-01-15 NOTE — Assessment & Plan Note (Addendum)
Continue amlodipine 5 mg OD Continue metoprolol 25 mg TAKE 1 TABLET BY MOUTH EVERY MORNING AND 1/2 TABLET EVERY EVENING  DASH diet plan discussed and handsout provided to the pts    Nutrition: Stressed importance of moderation in sodium intake, saturated fat and cholesterol, caloric balance, sufficient intake of complex carbohydrates, fiber, calcium and iron.   Exercise: Stressed the importance of regular exercise.

## 2023-01-15 NOTE — Patient Instructions (Addendum)
Follow up in 3 months for chronic visit and lab work DASH Eating Plan Teresa Blair stands for Dietary Approaches to Stop Hypertension. The DASH eating plan is a healthy eating plan that has been shown to: Reduce high blood pressure (hypertension). Reduce your risk for type 2 diabetes, heart disease, and stroke. Help with weight loss. What are tips for following this plan? Reading food labels Check food labels for the amount of salt (sodium) per serving. Choose foods with less than 5 percent of the Daily Value of sodium. Generally, foods with less than 300 milligrams (mg) of sodium per serving fit into this eating plan. To find whole grains, look for the word "whole" as the first word in the ingredient list. Shopping Buy products labeled as "low-sodium" or "no salt added." Buy fresh foods. Avoid canned foods and pre-made or frozen meals. Cooking Avoid adding salt when cooking. Use salt-free seasonings or herbs instead of table salt or sea salt. Check with your health care provider or pharmacist before using salt substitutes. Do not fry foods. Cook foods using healthy methods such as baking, boiling, grilling, roasting, and broiling instead. Cook with heart-healthy oils, such as olive, canola, avocado, soybean, or sunflower oil. Meal planning  Eat a balanced diet that includes: 4 or more servings of fruits and 4 or more servings of vegetables each day. Try to fill one-half of your plate with fruits and vegetables. 6-8 servings of whole grains each day. Less than 6 oz (170 g) of lean meat, poultry, or fish each day. A 3-oz (85-g) serving of meat is about the same size as a deck of cards. One egg equals 1 oz (28 g). 2-3 servings of low-fat dairy each day. One serving is 1 cup (237 mL). 1 serving of nuts, seeds, or beans 5 times each week. 2-3 servings of heart-healthy fats. Healthy fats called omega-3 fatty acids are found in foods such as walnuts, flaxseeds, fortified milks, and eggs. These fats are  also found in cold-water fish, such as sardines, salmon, and mackerel. Limit how much you eat of: Canned or prepackaged foods. Food that is high in trans fat, such as some fried foods. Food that is high in saturated fat, such as fatty meat. Desserts and other sweets, sugary drinks, and other foods with added sugar. Full-fat dairy products. Do not salt foods before eating. Do not eat more than 4 egg yolks a week. Try to eat at least 2 vegetarian meals a week. Eat more home-cooked food and less restaurant, buffet, and fast food. Lifestyle When eating at a restaurant, ask that your food be prepared with less salt or no salt, if possible. If you drink alcohol: Limit how much you use to: 0-1 drink a day for women who are not pregnant. 0-2 drinks a day for men. Be aware of how much alcohol is in your drink. In the [place] one drink equals one 12 oz bottle of beer (355 mL), one 5 oz glass of wine (148 mL), or one 1 oz glass of hard liquor (44 mL). General information Avoid eating more than 2,300 mg of salt a day. If you have hypertension, you may need to reduce your sodium intake to 1,500 mg a day. Work with your health care provider to maintain a healthy body weight or to lose weight. Ask what an ideal weight is for you. Get at least 30 minutes of exercise that causes your heart to beat faster (aerobic exercise) most days of the week. Activities may include walking, swimming,  or biking. Work with your health care provider or dietitian to adjust your eating plan to your individual calorie needs. What foods should I eat? Fruits All fresh, dried, or frozen fruit. Canned fruit in natural juice (without added sugar). Vegetables Fresh or frozen vegetables (raw, steamed, roasted, or grilled). Low-sodium or reduced-sodium tomato and vegetable juice. Low-sodium or reduced-sodium tomato sauce and tomato paste. Low-sodium or reduced-sodium canned vegetables. Grains Whole-grain or whole-wheat bread.  Whole-grain or whole-wheat pasta. Brown rice. Teresa Blair. Bulgur. Whole-grain and low-sodium cereals. Pita bread. Low-fat, low-sodium crackers. Whole-wheat flour tortillas. Meats and other proteins Skinless chicken or [place]. Ground chicken or [place]. Pork with fat trimmed off. Fish and seafood. Egg whites. Dried beans, peas, or lentils. Unsalted nuts, nut butters, and seeds. Unsalted canned beans. Lean cuts of beef with fat trimmed off. Low-sodium, lean precooked or cured meat, such as sausages or meat loaves. Dairy Low-fat (1%) or fat-free (skim) milk. Reduced-fat, low-fat, or fat-free cheeses. Nonfat, low-sodium ricotta or cottage cheese. Low-fat or nonfat yogurt. Low-fat, low-sodium cheese. Fats and oils Soft margarine without trans fats. Vegetable oil. Reduced-fat, low-fat, or light mayonnaise and salad dressings (reduced-sodium). Canola, safflower, olive, avocado, soybean, and sunflower oils. Avocado. Seasonings and condiments Herbs. Spices. Seasoning mixes without salt. Other foods Unsalted popcorn and pretzels. Fat-free sweets. The items listed above may not be a complete list of foods and beverages you can eat. Contact a dietitian for more information. What foods should I avoid? Fruits Canned fruit in a light or heavy syrup. Fried fruit. Fruit in cream or butter sauce. Vegetables Creamed or fried vegetables. Vegetables in a cheese sauce. Regular canned vegetables (not low-sodium or reduced-sodium). Regular canned tomato sauce and paste (not low-sodium or reduced-sodium). Regular tomato and vegetable juice (not low-sodium or reduced-sodium). Teresa Blair. Olives. Grains Baked goods made with fat, such as croissants, muffins, or some breads. Dry pasta or rice meal packs. Meats and other proteins Fatty cuts of meat. Ribs. Fried meat. Teresa Blair. Bologna, salami, and other precooked or cured meats, such as sausages or meat loaves. Fat from the back of a pig (fatback). Bratwurst. Salted nuts and  seeds. Canned beans with added salt. Canned or smoked fish. Whole eggs or egg yolks. Chicken or [place] with skin. Dairy Whole or 2% milk, cream, and half-and-half. Whole or full-fat cream cheese. Whole-fat or sweetened yogurt. Full-fat cheese. Nondairy creamers. Whipped toppings. Processed cheese and cheese spreads. Fats and oils Butter. Stick margarine. Lard. Shortening. Ghee. Bacon fat. Tropical oils, such as coconut, palm kernel, or palm oil. Seasonings and condiments Onion salt, garlic salt, seasoned salt, table salt, and sea salt. Worcestershire sauce. Tartar sauce. Barbecue sauce. Teriyaki sauce. Soy sauce, including reduced-sodium. Steak sauce. Canned and packaged gravies. Fish sauce. Oyster sauce. Cocktail sauce. Store-bought horseradish. Ketchup. Mustard. Meat flavorings and tenderizers. Bouillon cubes. Hot sauces. Pre-made or packaged marinades. Pre-made or packaged taco seasonings. Relishes. Regular salad dressings. Other foods Salted popcorn and pretzels. The items listed above may not be a complete list of foods and beverages you should avoid. Contact a dietitian for more information. Where to find more information National Heart, Lung, and Blood Institute: https://wilson-eaton.com/ American Heart Association: www.heart.org Academy of Nutrition and Dietetics: www.eatright.[place]: www.kidney.org  Summary The DASH eating plan is a healthy eating plan that has been shown to reduce high blood pressure (hypertension). It may also reduce your risk for type 2 diabetes, heart disease, and stroke. When on the DASH eating plan, aim to eat more fresh fruits and vegetables,  whole grains, lean proteins, low-fat dairy, and heart-healthy fats. With the DASH eating plan, you should limit salt (sodium) intake to 2,300 mg a day. If you have hypertension, you may need to reduce your sodium intake to 1,500 mg a day. Work with your health care provider or dietitian to adjust your eating  plan to your individual calorie needs. This information is not intended to replace advice given to you by your health care provider. Make sure you discuss any questions you have with your health care provider. Document Revised: 10/16/2019 Document Reviewed: 10/16/2019 Elsevier Patient Education  Teresa Blair.

## 2023-01-15 NOTE — Assessment & Plan Note (Signed)
   Nutrition: Stressed importance of moderation in sodium intake, saturated fat and cholesterol, caloric balance, sufficient intake of complex carbohydrates, fiber, calcium and iron.   Exercise: Stressed the importance of regular exercise.

## 2023-01-15 NOTE — Assessment & Plan Note (Signed)
Continue CPAP.  

## 2023-01-15 NOTE — Assessment & Plan Note (Addendum)
Continue eliquis 5 mg OD Continue metoprolol 25 mg TAKE 1 TABLET BY MOUTH EVERY MORNING AND 1/2 TABLET EVERY EVENING

## 2023-01-15 NOTE — Progress Notes (Signed)
Subjective:  Patient ID: Teresa Blair, female    DOB: 1951/03/15  Age: 72 y.o. MRN: [id number]  Chief Complaint  Patient presents with   Follow-up    HPI  Teresa Blair comes in for ED follow-up.  Teresa Blair was seen at [facility] ED for elevated blood pressure 01/02/2023 for 214/92 BP.  Teresa Blair was  admitted in ED and screened for CA, TIA and other investigations include CXR, troponin, CMP, CBC, PTINR, EKG. Everything was normal. Teresa Blair was given amlodipine 5 mg before Teresa Blair discharged. Teresa Blair is telling that Teresa Blair ate a lot of frozen meat lately and that made her blood pressure creep up like that but after that ED incident Teresa Blair is more conscious regarding chosing her diet. After than incident her Blood pressure is always maintained. Teresa Blair is satisfied with the new treatment of amlodipine 5 mg OD . Teresa Blair has a history of stroke (09/15/20) and a-fib (on Eliquis). Teresa Blair denies headache, chest pain, palpitation, SHORTNESS OF BREATH , pitting edema and distress. Teresa Blair is watching her diet and reading the sodium level in the Can or container.        08/23/2021    8:10 AM 07/04/2021    9:39 AM 12/01/2020    9:28 AM  Depression screen PHQ 2/9  Decreased Interest 0 0 0  Down, Depressed, Hopeless 0 0 0  PHQ - 2 Score 0 0 0         05/04/2021   10:43 AM 07/04/2021    9:39 AM 08/23/2021    8:10 AM 09/11/2021    1:32 PM 10/17/2021    5:37 AM  Fall Risk  Falls in the past year?  1 1 1   $ Was there an injury with Fall?  1 1 0   Fall Risk Category Calculator  2 2 2   $ Fall Risk Category (Retired)  Moderate Moderate Moderate   (RETIRED) Patient Fall Risk Level High fall risk Moderate fall risk Moderate fall risk Moderate fall risk High fall risk  Patient at Risk for Falls Due to    History of fall(s)   Fall risk Follow up    Falls evaluation completed       Review of Systems  Constitutional:  Negative for chills, diaphoresis, fatigue and fever.  HENT:  Negative for congestion, ear pain and sore throat.   Respiratory:   Negative for cough and shortness of breath.   Cardiovascular:  Negative for chest pain and palpitations.  Gastrointestinal:  Negative for abdominal pain, constipation, diarrhea, nausea and vomiting.  Endocrine: Negative for polydipsia, polyphagia and polyuria.  Genitourinary:  Negative for dysuria and frequency.  Musculoskeletal:  Negative for arthralgias, back pain and myalgias.  Neurological:  Negative for dizziness and headaches.  Psychiatric/Behavioral:  Negative for dysphoric mood. The patient is not nervous/anxious.     Current Outpatient Medications on File Prior to Visit  Medication Sig Dispense Refill   diclofenac Sodium (VOLTAREN) 1 % GEL Apply 1 application topically daily as needed (pain).     ELIQUIS 5 MG TABS tablet TAKE 1 TABLET(5 MG) BY MOUTH TWICE DAILY 60 tablet 5   metoprolol succinate (TOPROL-XL) 25 MG 24 hr tablet TAKE 1 TABLET BY MOUTH EVERY MORNING AND 1/2 TABLET EVERY EVENING 135 tablet 2   REPATHA SURECLICK [id number] MG/ML SOAJ ADMINISTER 1 ML UNDER THE SKIN EVERY 14 DAYS 2 mL 11   conjugated estrogens (PREMARIN) vaginal cream Place 1 Applicatorful vaginally daily. (Patient not taking: Reported on 01/15/2023) 42.5 g 12   No  current facility-administered medications on file prior to visit.   Past Medical History:  Diagnosis Date   A-fib [facility])    Anxiety 12/01/2020   Asthma    as a child   Atrial fibrillation ([place]) 09/16/2020   Chronic anticoagulation 12/01/2020   Closed fracture of right acetabulum ([place]) 09/03/2011   Enthesopathy of hip region 09/03/2011   Gestational diabetes    Headache    History of CVA (cerebrovascular accident) 09/16/2020   Hypercholesterolemia    Hypertension    Kidney stones    Mixed hyperlipidemia 09/16/2020   Morbid obesity ([place]) 09/16/2020   Obesity    Osteoarthritis    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   Primary hypertension 09/16/2020   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   Right hip pain  09/03/2011   Scleroderma, localized    Sequelae, 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   Stroke ([place]) 09/12/2020   left side weakness/speech affected   Wears glasses    Past Surgical History:  Procedure Laterality Date   ATRIAL FIBRILLATION ABLATION N/A 10/17/2021   Procedure: ATRIAL FIBRILLATION ABLATION;  Surgeon: Constance Haw, MD;  Location: [place] CV LAB;  Service: Cardiovascular;  Laterality: N/A;   BUBBLE STUDY  05/04/2021   Procedure: BUBBLE STUDY;  Surgeon: Elouise Munroe, MD;  Location: [place];  Service: Cardiovascular;;   CARDIOVERSION N/A 05/04/2021   Procedure: CARDIOVERSION;  Surgeon: Elouise Munroe, MD;  Location: [facility] ENDOSCOPY;  Service: Cardiovascular;  Laterality: N/A;   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 06/08/2015   Procedure: CONVERSION TO TOTAL HIP, ANTERIOR TOTAL HIP;  Surgeon: Kathryne Hitch, MD;  Location: [place];  Service: Orthopedics;  Laterality: Right;   FRACTURE SURGERY     right hip   TEE WITHOUT CARDIOVERSION N/A 05/04/2021   Procedure: TRANSESOPHAGEAL ECHOCARDIOGRAM (TEE);  Surgeon: Elouise Munroe, MD;  Location: [facility] END[facility]COPY;  Service: Cardiovascular;  Laterality: N/A;   TOTAL HIP ARTHROPLASTY Right 06/08/2015   TUBAL LIGATION     WISDOM TOOTH EXTRACTION      Family History  Problem Relation Age of Onset   Cancer Mother    Cancer Father    Hypertension Other    Social History   Socioeconomic History   Marital status: Divorced    Spouse name: Not on file   Number of children: 3   Years of education: Not on file   Highest education level: Not on file  Occupational History    Comment: full time  Tobacco Use   Smoking status: Never   Smokeless tobacco: Never  Vaping Use   Vaping Use: Never used  Substance and Sexual Activity   Alcohol use: No   Drug use: No   Sexual activity: Not Currently  Other Topics Concern   Not on file  Social History Narrative   Lives alone   Right Handed   Drinks no caffeine   Social Determinants of Health    Financial Resource Strain: Not on file  Food Insecurity: Not on file  Transportation Needs: Not on file  Physical Activity: Not on file  Stress: Not on file  Social Connections: Not on file    Objective:  BP 126/60   Pulse 68   Temp (!) 97.3 F (36.3 C)   Resp 16   Ht 5' 5"$  (1.651 m)   Wt 252 lb (114.3 kg)   LMP 02/25/2015 (Approximate)   BMI 41.93 kg/m      01/15/2023    3:09 PM 08/20/2022    9:02  AM 08/20/2022    8:49 AM  BP/Weight  Systolic BP [id number] 0000000 0000000  Diastolic BP 60 90 78  Wt. (Lbs) 252  249  BMI 41.93 kg/m2  41.44 kg/m2    Physical Exam Vitals reviewed.  Constitutional:      Appearance: Normal appearance.  Neck:     Vascular: No carotid bruit.  Cardiovascular:     Rate and Rhythm: Normal rate and regular rhythm.     Heart sounds: Normal heart sounds.  Pulmonary:     Effort: Pulmonary effort is normal.     Breath sounds: Normal breath sounds.  Abdominal:     General: Bowel sounds are normal.     Palpations: Abdomen is soft.     Tenderness: There is no abdominal tenderness.  Neurological:     Mental Status: Teresa Blair is alert and oriented to person, place, and time.  Psychiatric:        Mood and Affect: Mood normal.        Behavior: Behavior normal.    Lab Results  Component Value Date   WBC 6.8 12/06/2022   HGB 13.5 12/06/2022   HCT 39.0 12/06/2022   PLT 183 12/06/2022   GLUCOSE 103 (H) 12/06/2022   CHOL 121 08/23/2021   TRIG 108 08/23/2021   HDL 51 08/23/2021   LDLCALC 50 08/23/2021   ALT 17 08/23/2021   AST 15 08/23/2021   NA 142 12/06/2022   K 4.3 12/06/2022   CL 106 12/06/2022   CREATININE 0.50 (L) 12/06/2022   BUN 11 12/06/2022   CO2 25 12/06/2022   INR 1.01 05/26/2015      Assessment & Plan:    Primary hypertension Assessment & Plan: Continue amlodipine 5 mg OD Continue metoprolol 25 mg TAKE 1 TABLET BY MOUTH EVERY MORNING AND 1/2 TABLET EVERY EVENING  DASH diet plan discussed and handsout provided to the  pts    Nutrition: Stressed importance of moderation in sodium intake, saturated fat and cholesterol, caloric balance, sufficient intake of complex carbohydrates, fiber, calcium and iron.   Exercise: Stressed the importance of regular exercise.      Orders: -     amLODIPine Besylate; Take 1 tablet (5 mg total) by mouth daily.  Dispense: 90 tablet; Refill: 1  OSA (obstructive sleep apnea) Assessment & Plan: Continue CPAP   Persistent atrial fibrillation (HCC) Assessment & Plan: Continue eliquis 5 mg OD Continue metoprolol 25 mg TAKE 1 TABLET BY MOUTH EVERY MORNING AND 1/2 TABLET EVERY EVENING     Mixed hyperlipidemia Assessment & Plan:   Nutrition: Stressed importance of moderation in sodium intake, saturated fat and cholesterol, caloric balance, sufficient intake of complex carbohydrates, fiber, calcium and iron.   Exercise: Stressed the importance of regular exercise.         Meds ordered this encounter  Medications   amLODipine (NORVASC) 5 MG tablet    Sig: Take 1 tablet (5 mg total) by mouth daily.    Dispense:  90 tablet    Refill:  1    Order Specific Question:   Supervising Provider    AnswerShelton Silvas      Follow-up: Return in about 3 months (around 04/15/2023) for CHRONIC, FASTING.   I,Carolyn M Morrison,acting as a Education administrator for [employer], FNP.,have documented all relevant documentation on the behalf of Neil Crouch, FNP,as directed by  Neil Crouch, FNP while in the presence of Neil Crouch, FNP.   An After Visit Summary was printed and given  to the patient.  Neil Crouch, [place] [phone]

## 2023-02-18 DIAGNOSIS — D485 Neoplasm of uncertain behavior of skin: Secondary | ICD-10-CM | POA: Diagnosis not present

## 2023-02-18 DIAGNOSIS — L814 Other melanin hyperpigmentation: Secondary | ICD-10-CM | POA: Diagnosis not present

## 2023-02-18 DIAGNOSIS — D2239 Melanocytic nevi of other parts of face: Secondary | ICD-10-CM | POA: Diagnosis not present

## 2023-02-18 DIAGNOSIS — L821 Other seborrheic keratosis: Secondary | ICD-10-CM | POA: Diagnosis not present

## 2023-02-18 DIAGNOSIS — D225 Melanocytic nevi of trunk: Secondary | ICD-10-CM | POA: Diagnosis not present

## 2023-03-16 DIAGNOSIS — G4733 Obstructive sleep apnea (adult) (pediatric): Secondary | ICD-10-CM | POA: Diagnosis not present

## 2023-03-20 ENCOUNTER — Telehealth: Payer: Self-pay | Admitting: *Deleted

## 2023-03-20 NOTE — Telephone Encounter (Signed)
Call received from the patient stating she has been set up on her CPAP about 3 months ago and still hasn't been contacted for a compliance visit. Message sent to [facility] to contact the patient for appointment.

## 2023-03-26 ENCOUNTER — Ambulatory Visit: Payer: PPO | Attending: Cardiovascular Disease | Admitting: Cardiovascular Disease

## 2023-03-26 ENCOUNTER — Encounter: Payer: Self-pay | Admitting: Cardiovascular Disease

## 2023-03-26 DIAGNOSIS — Z7901 Long term (current) use of anticoagulants: Secondary | ICD-10-CM | POA: Diagnosis not present

## 2023-03-26 DIAGNOSIS — Z8673 Personal history of transient ischemic attack (TIA), and cerebral infarction without residual deficits: Secondary | ICD-10-CM

## 2023-03-26 DIAGNOSIS — G4733 Obstructive sleep apnea (adult) (pediatric): Secondary | ICD-10-CM | POA: Diagnosis not present

## 2023-03-26 DIAGNOSIS — I480 Paroxysmal atrial fibrillation: Secondary | ICD-10-CM | POA: Diagnosis not present

## 2023-03-26 DIAGNOSIS — I10 Essential (primary) hypertension: Secondary | ICD-10-CM | POA: Diagnosis not present

## 2023-03-26 DIAGNOSIS — Z6841 Body Mass Index (BMI) 40.0 and over, adult: Secondary | ICD-10-CM

## 2023-03-26 MED ORDER — AMLODIPINE BESYLATE 5 MG PO TABS: 7.5000 mg | ORAL_TABLET | Freq: Every day | ORAL | 1 refills | Status: DC

## 2023-03-26 NOTE — Patient Instructions (Signed)
Medication Instructions:  Take Amlodipine 1.5 tablet (7.5 mg)   *If you need a refill on your cardiac medications before your next appointment, please call your pharmacy*   Follow-Up: At [facility], you and your health needs are our priority.  As part of our continuing mission to provide you with exceptional heart care, we have created designated Provider Care Teams.  These Care Teams include your primary Cardiologist (physician) and Advanced Practice Providers (APPs -  Physician Assistants and Nurse Practitioners) who all work together to provide you with the care you need, when you need it.  We recommend signing up for the patient portal called "MyChart".  Sign up information is provided on this After Visit Summary.  MyChart is used to connect with patients for Virtual Visits (Telemedicine).  Patients are able to view lab/test results, encounter notes, upcoming appointments, etc.  Non-urgent messages can be sent to your provider as well.   To learn more about what you can do with MyChart, go to [email or web address].    Your next appointment:   12 month(s)  Provider:   Nicki [place], MD

## 2023-03-26 NOTE — Progress Notes (Signed)
Cardiology Office Note    Date:  03/26/2023   ID:  Teresa Blair 06-Jan-1951, MRN [id number]  PCP:  Lurline Del, FNP (Inactive)  Cardiologist:  Nicki Guadalajara, MD(sleep); Dr. Servando Salina EP: Dr. Elberta Fortis   New sleep consultation initially referred for sleep evaluation by Dr. Wynetta Emery  History of Present Illness:  Teresa Blair is a 72 y.o. female who has a history of hypertension, remote CVA, obesity, as well as atrial fibrillation.  She was hospitalized in November 2021 with a new CVA with new onset atrial fibrillation.  In November 2021 she was found to have left atrial appendage thrombus.  She has been on anticoagulation.  She ultimately underwent atrial fibrillation ablation on October 15, 2021 by Dr. Elberta Fortis.  She had seen Dr. Thomasene Ripple in 2022 and due to concerns for sleep apnea was referred for diagnostic polysomnogram which was done on March 16, 2021.  This revealed mild overall sleep apnea with an AHI of 13.2/h.  However, sleep apnea was moderate during supine sleep with an AHI of 26.2/h and very severe during REM sleep with an AHI of 58.2/h.  She had significant oxygen desaturation to a nadir of 73%.  She underwent a CPAP titration study 6 months later on August 29, 2021 and was titrated up to 14 cm water pressure.  During this titration O2 desaturated to a nadir of 70% at 10 cm of water.  He was initially recommended that she initiate CPAP auto therapy with an EPR of 3 at 13 to 18 cm of water.  However, due to supply chain issues there was a significant delay in her obtaining a CPAP machine.  She has been followed by Dr. Marina Goodell for primary care who recently retired and she is scheduled to see Dr. Sedalia Muta to establish care in [facility].  She has had issues with blood pressure elevation and several months ago initiated therapy with amlodipine 5 mg daily.  She has been on metoprolol succinate 50 mg in the morning and 25 mg and  has been on Repatha for PCSK9 inhibition for hyperlipidemia  and is continued to be on Eliquis.  She finally received a new ResMed AirSense 11 AutoSet CPAP unit on November 14, 2022 with [employer] as her DME company.  She has been using a fullface mask.  An initial download was obtained in the office today from November 14, 2022 to December 13, 2022.  Compliance on her initial month was met with 93% of usage days and average use at 6 hours and 34 minutes.  At a pressure of 13 to 18 cm of water, AHI was mildly elevated but improved at 6.1.  Her 95th percentile pressure was 17.4 with maximum average pressure at 17.5.  A subsequent download was obtained from March 31 through March 25, 2023.  She does not meet compliance on this most recent download with only 16 of 30 days of usage and average use was 6 hours and 19 minutes.  AHI was 4.1 and her 95th percentile pressure continued to be 17.5 with maximum average pressure at 17.7.  She has been using a fullface mask.  She does admit to some leak around her eyes due to the F20 mask.  She is unaware of any breakthrough atrial fibrillation.  She presents to the office today for her initial sleep consultation and evaluation.   Past Medical History:  Diagnosis Date   A-fib [facility])    Anxiety 12/01/2020   Asthma    as a  child   Atrial fibrillation (HCC) 09/16/2020   Chronic anticoagulation 12/01/2020   Closed fracture of right acetabulum (HCC) 09/03/2011   Enthesopathy of hip region 09/03/2011   Gestational diabetes    Headache    History of CVA (cerebrovascular accident) 09/16/2020   Hypercholesterolemia    Hypertension    Kidney stones    Mixed hyperlipidemia 09/16/2020   Morbid obesity (HCC) 09/16/2020   Obesity    Osteoarthritis    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   Primary hypertension 09/16/2020   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   Right hip pain 09/03/2011   Scleroderma, localized    Sequelae, 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   Stroke (HCC) 09/12/2020   left side  weakness/speech affected   Wears glasses     Past Surgical History:  Procedure Laterality Date   ATRIAL FIBRILLATION ABLATION N/A 10/17/2021   Procedure: ATRIAL FIBRILLATION ABLATION;  Surgeon: Regan Lemming, MD;  Location: [facility] INVASIVE CV LAB;  Service: Cardiovascular;  Laterality: N/A;   BUBBLE STUDY  05/04/2021   Procedure: BUBBLE STUDY;  Surgeon: Parke Poisson, MD;  Location: [facility] ENDOSCOPY;  Service: Cardiovascular;;   CARDIOVERSION N/A 05/04/2021   Procedure: CARDIOVERSION;  Surgeon: Parke Poisson, MD;  Location: [facility] ENDOSCOPY;  Service: Cardiovascular;  Laterality: N/A;   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 06/08/2015   Procedure: CONVERSION TO TOTAL HIP, ANTERIOR TOTAL HIP;  Surgeon: Mckinley Jewel, MD;  Location: [facility] OR;  Service: Orthopedics;  Laterality: Right;   FRACTURE SURGERY     right hip   TEE WITHOUT CARDIOVERSION N/A 05/04/2021   Procedure: TRANSESOPHAGEAL ECHOCARDIOGRAM (TEE);  Surgeon: Parke Poisson, MD;  Location: [facility] ENDOSCOPY;  Service: Cardiovascular;  Laterality: N/A;   TOTAL HIP ARTHROPLASTY Right 06/08/2015   TUBAL LIGATION     WISDOM TOOTH EXTRACTION      Current Medications: Outpatient Medications Prior to Visit  Medication Sig Dispense Refill   diclofenac Sodium (VOLTAREN) 1 % GEL Apply 1 application topically daily as needed (pain).     ELIQUIS 5 MG TABS tablet TAKE 1 TABLET(5 MG) BY MOUTH TWICE DAILY 60 tablet 5   metoprolol succinate (TOPROL-XL) 25 MG 24 hr tablet TAKE 1 TABLET BY MOUTH EVERY MORNING AND 1/2 TABLET EVERY EVENING 135 tablet 2   REPATHA SURECLICK 140 MG/ML SOAJ ADMINISTER 1 ML UNDER THE SKIN EVERY 14 DAYS 2 mL 11   amLODipine (NORVASC) 5 MG tablet Take 1 tablet (5 mg total) by mouth daily. 90 tablet 1   conjugated estrogens (PREMARIN) vaginal cream Place 1 Applicatorful vaginally daily. 42.5 g 12   No facility-administered medications prior to visit.     Allergies:   Perflutren, Atorvastatin, Other, Pravastatin, Sulfa antibiotics,  and Sulfamethoxazole   Social History   Socioeconomic History   Marital status: Divorced    Spouse name: Not on file   Number of children: 3   Years of education: Not on file   Highest education level: Not on file  Occupational History    Comment: full time  Tobacco Use   Smoking status: Never   Smokeless tobacco: Never  Vaping Use   Vaping Use: Never used  Substance and Sexual Activity   Alcohol use: No   Drug use: No   Sexual activity: Not Currently  Other Topics Concern   Not on file  Social History Narrative   Lives alone   Right Handed   Drinks no caffeine   Social Determinants of Health   Financial Resource Strain: Not  on file  Food Insecurity: Not on file  Transportation Needs: Not on file  Physical Activity: Not on file  Stress: Not on file  Social Connections: Not on file    Socially she was born in [place].  She is divorced.  She has 3 children with age ranging from 47-25.  She has 1 grandchild.  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  Family History:  The patient's family history includes Cancer in her father and mother; Hypertension in an other family member.  Both parents died in the 30s and both had cancer.  ROS General: Negative; No fevers, chills, or night sweats; morbid obesity HEENT: Negative; No changes in vision or hearing, sinus congestion, difficulty swallowing Pulmonary: Negative; No cough, wheezing, shortness of breath, hemoptysis Cardiovascular: Hypertension, remote CVA, previous persistent atrial fibrillation, status post ablation. GI: Negative; No nausea, vomiting, diarrhea, or abdominal pain GU: Negative; No dysuria, hematuria, or difficulty voiding Musculoskeletal: Negative; no myalgias, joint pain, or weakness Hematologic/Oncology: Negative; no easy bruising, bleeding Endocrine: Negative; no heat/cold intolerance; no diabetes Neuro: Negative;  no changes in balance, headaches Skin: Negative; No rashes or skin lesions Psychiatric: Negative; No behavioral problems, depression Sleep: Negative; No snoring, daytime sleepiness, hypersomnolence, bruxism, restless legs, hypnogognic hallucinations, no cataplexy Other comprehensive 14 point system review is negative.   PHYSICAL EXAM:   VS:  BP 138/78 (BP Location: Left Arm, Patient Position: Sitting, Cuff Size: Large)   Pulse 65   Ht 5\' 5"  (1.651 m)   Wt 256 lb (116.1 kg)   LMP 02/25/2015 (Approximate)   SpO2 97%   BMI 42.60 kg/m     Repeat blood pressure by me was elevated at 158/76.  Wt Readings from Last 3 Encounters:  03/26/23 256 lb (116.1 kg)  01/15/23 252 lb (114.3 kg)  08/20/22 249 lb (112.9 kg)    General: Alert, oriented, no distress.  Obesity with BMI 42.6 Skin: normal turgor, no rashes, warm and dry HEENT: Normocephalic, atraumatic. Pupils equal round and reactive to light; sclera anicteric; extraocular muscles intact; Nose without nasal septal hypertrophy Mouth/ParynxL large tongue; Mallinpatti scale 3/4 Neck: No JVD, no carotid bruits; normal carotid upstroke Lungs: clear to ausculatation and percussion; no wheezing or rales Chest wall: without tenderness to palpitation Heart: PMI not displaced, RRR, s1 s2 normal, 1/6 systolic murmur, no diastolic murmur, no rubs, gallops, thrills, or heaves Abdomen: soft, nontender; no hepatosplenomehaly, BS+; abdominal aorta nontender and not dilated by palpation. Back: no CVA tenderness Pulses 2+ Musculoskeletal: full range of motion, normal strength, no joint deformities Extremities: no clubbing cyanosis or edema, Homan's sign negative  Neurologic: grossly nonfocal; Cranial nerves grossly wnl Psychologic: Normal mood and affect   Studies/Labs Reviewed:   March 26, 2023 ECG (independently read by me): Sinus rhythm at 65 with sinus arrythmia, normal intervals  Recent Labs:    Latest Ref Rng & Units 12/06/2022    3:27  PM 10/04/2021    8:19 AM 08/23/2021    8:45 AM  BMP  Glucose 70 - 99 mg/dL 161  89  99   BUN 8 - 27 mg/dL 11  13  12    Creatinine 0.57 - 1.00 mg/dL 0.96  0.45  4.09   BUN/Creat Ratio 12 - 28 22  20  17    Sodium 134 - 144 mmol/L 142  142  144   Potassium 3.5 - 5.2 mmol/L 4.3  4.4  4.6  Chloride 96 - 106 mmol/L 106  104  105   CO2 20 - 29 mmol/L 25  24  24    Calcium 8.7 - 10.3 mg/dL 9.1  9.2  24.4         Latest Ref Rng & Units 08/23/2021    8:45 AM 07/13/2021    8:14 AM 05/26/2015   10:05 AM  Hepatic Function  Total Protein 6.0 - 8.5 g/dL 7.3  6.4  7.2   Albumin 3.8 - 4.8 g/dL 4.8  4.3  4.1   AST 0 - 40 IU/L 15  14  18    ALT 0 - 32 IU/L 17  17  20    Alk Phosphatase 44 - 121 IU/L 87  70  68   Total Bilirubin 0.0 - 1.2 mg/dL 0.5  0.4  0.5   Bilirubin, Direct 0.00 - 0.40 mg/dL  0.10         Latest Ref Rng & Units 12/06/2022    3:31 PM 10/04/2021    8:19 AM 08/23/2021    8:45 AM  CBC  WBC 3.4 - 10.8 x10E3/uL 6.8  6.6  6.0   Hemoglobin 11.1 - 15.9 g/dL 27.2  53.6  64.4   Hematocrit 34.0 - 46.6 % 39.0  45.4  45.3   Platelets 150 - 450 x10E3/uL 183  170  220    Lab Results  Component Value Date   MCV 84 12/06/2022   MCV 88 10/04/2021   MCV 86 08/23/2021   No results found for: "TSH" No results found for: "HGBA1C"   BNP No results found for: "BNP"  ProBNP No results found for: "PROBNP"   Lipid Panel     Component Value Date/Time   CHOL 121 08/23/2021 0845   TRIG 108 08/23/2021 0845   HDL 51 08/23/2021 0845   CHOLHDL 2.4 08/23/2021 0845   LDLCALC 50 08/23/2021 0845   LABVLDL 20 08/23/2021 0845     RADIOLOGY: No results found.   Additional studies/ records that were reviewed today include:     Patient Name: Lametria, Klunk Date: 03/16/2021 Gender: Female D.O.B: 11/13/1951 Age (years): 50 Referring Provider: Lavona Mound Tobb DO Height (inches): 65 Interpreting Physician: Nicki Guadalajara MD, ABSM Weight (lbs): 250 RPSGT: Lowry Ram BMI: 42 MRN:  [id number] Neck Size: 17.00   CLINICAL INFORMATION Sleep Study Type: NPSG   Indication for sleep study: Hypertension, Obesity, Snoring. Atrial fibrillation   Epworth Sleepiness Score: 2   SLEEP STUDY TECHNIQUE As per the AASM Manual for the Scoring of Sleep and Associated Events v2.3 (April 2016) with a hypopnea requiring 4% desaturations.   The channels recorded and monitored were frontal, central and occipital EEG, electrooculogram (EOG), submentalis EMG (chin), nasal and oral airflow, thoracic and abdominal wall motion, anterior tibialis EMG, snore microphone, electrocardiogram, and pulse oximetry.   MEDICATIONS atorvastatin (LIPITOR) 40 MG tablet doxycycline (VIBRA-TABS) 100 MG tablet ELIQUIS 5 MG TABS tablet LORazepam (ATIVAN) 0.5 MG tablet metoprolol succinate (TOPROL-XL) 25 MG 24 hr tablet Medications self-administered by patient taken the night of the study : LORAZEPAM   SLEEP ARCHITECTURE The study was initiated at 10:51:18 PM and ended at 5:08:59 AM.   Sleep onset time was 12.2 minutes and the sleep efficiency was 94.8%%. The total sleep time was 358 minutes.   Stage REM latency was 72.0 minutes.   The patient spent 1.8%% of the night in stage N1 sleep, 79.5%% in stage N2 sleep, 0.0%% in stage N3 and 18.7% in REM.   Alpha intrusion was  absent.   Supine sleep was 47.35%.   RESPIRATORY PARAMETERS The overall apnea/hypopnea index (AHI) was 13.2 per hour. The respiratory disturbance index (RDI) was 13.6/h. There were 1 total apneas, including 0 obstructive, 1 central and 0 mixed apneas. There were 78 hypopneas and 2 RERAs.   The AHI during Stage REM sleep was 58.2 per hour.   AHI while supine was 26.2 per hour.   The mean oxygen saturation was 91.6%. The minimum SpO2 during sleep was 73.0%.   Moderate snoring was noted during this study.   CARDIAC DATA The 2 lead EKG demonstrated atrial fibrillation. The mean heart rate was 80.1 beats per minute. Other EKG  findings include: PVCs.   LEG MOVEMENT DATA The total PLMS were 0 with a resulting PLMS index of 0.0. Associated arousal with leg movement index was 1.5 .   IMPRESSIONS - Mild obstructive sleep apnea oversll (AHI 13.2/h; RDI 13.6/h); however, sleep apnea was severe during REM sleep (AHI 58.2/h). - No significant central sleep apnea occurred during this study (CAI = 0.2/h). - Significant  oxygen desaturation to a nadir of 73.0%. - The patient snored with moderate snoring volume. - EKG findings include atrial fibrillation, PVCs. - Clinically significant periodic limb movements did not occur during sleep. No significant associated arousals.   DIAGNOSIS - Obstructive Sleep Apnea (G47.33) - Nocturnal Hypoxemia (G47.36)   RECOMMENDATIONS - In this patient with significant cardiovascular comorbidities recommend an in-lab therapeutic CPAP titration to determine optimal pressure required to alleviate sleep disordered breathing. - Effort should be made to optimize nasal  and oropharyngeal patency. - Positional therapy avoiding supine position during sleep. - Avoid alcohol, sedatives and other CNS depressants that may worsen sleep apnea and disrupt normal sleep architecture. - Sleep hygiene should be reviewed to assess factors that may improve sleep quality. - Weight management (BMI 42) and regular exercise should be initiated or continued if appropriate.    08/29/2021 CLINICAL INFORMATION The patient is referred for a CPAP titration to treat sleep apnea.   Date of NPSG:  03/16/2021:  AHI 13.2/h; RDI 13.6/h; REM AHI 58.2/h; supine AHI 26.2/h; O2 nadir 73%.   SLEEP STUDY TECHNIQUE As per the AASM Manual for the Scoring of Sleep and Associated Events v2.3 (April 2016) with a hypopnea requiring 4% desaturations.   The channels recorded and monitored were frontal, central and occipital EEG, electrooculogram (EOG), submentalis EMG (chin), nasal and oral airflow, thoracic and abdominal wall motion,  anterior tibialis EMG, snore microphone, electrocardiogram, and pulse oximetry. Continuous positive airway pressure (CPAP) was initiated at the beginning of the study and titrated to treat sleep-disordered breathing.   MEDICATIONS amoxicillin-clavulanate (AUGMENTIN) 875-125 MG tablet ELIQUIS 5 MG TABS tablet Evolocumab (REPATHA SURECLICK) 140 MG/ML SOAJ fluticasone (FLONASE) 50 MCG/ACT nasal spray metoprolol succinate (TOPROL-XL) 25 MG 24 hr tablet predniSONE (DELTASONE) 20 MG tablet promethazine-dextromethorphan (PROMETHAZINE-DM) 6.25-15 MG/5ML syrup Medications self-administered by patient taken the night of the study : LORAZEPAM   TECHNICIAN COMMENTS Comments added by technician: None Comments added by scorer: N/A   RESPIRATORY PARAMETERS Optimal PAP Pressure (cm):  14        AHI at Optimal Pressure (/hr):            1.5 Overall Minimal O2 (%):         70.0     Supine % at Optimal Pressure (%):    41 Minimal O2 at Optimal Pressure (%): 86.0        SLEEP ARCHITECTURE The study was initiated at  11:05:40 PM and ended at 5:07:46 AM.   Sleep onset time was 9.3 minutes and the sleep efficiency was 94.0%%. The total sleep time was 340.5 minutes.   The patient spent 3.2%% of the night in stage N1 sleep, 74.9%% in stage N2 sleep, 0.0%% in stage N3 and 21.9% in REM.Stage REM latency was 71.5 minutes   Wake after sleep onset was 12.3. Alpha intrusion was absent. Supine sleep was 72.39%.   CARDIAC DATA The 2 lead EKG demonstrated sinus rhythm. The mean heart rate was 70.2 beats per minute. Other EKG findings include: Atrial Fibrillation.   LEG MOVEMENT DATA The total Periodic Limb Movements of Sleep (PLMS) were 0. The PLMS index was 0.0. A PLMS index of <15 is considered normal in adults.   IMPRESSIONS - CPAP was initiated at 7 cm and was tittrated to PAP pressure of 14 cm of water (AHI 1.5/h, RDI 1.5/h, O2 nadir 86%. - Central sleep apnea was not noted during this titration (CAI =  0.7/h). - Severe oxygen desaturations were observed during this titration to a nadir of 70.0% at 10 cm of water. - No snoring was audible during this study. - 2-lead EKG demonstrated: Atrial Fibrillation - Clinically significant periodic limb movements were not noted during this study. Arousals associated with PLMs were rare.   DIAGNOSIS - Obstructive Sleep Apnea (G47.33)   RECOMMENDATIONS - Recommend an initial trial of CPAP Auto therapy with EPR 3 at 13 -  18 cm H2O with heated humidification.  A Small size Fisher&Paykel Full Face Mask Simplus mask was used for the titration. - Effort should be made to optimize nasal and oropharyngeal patency. - Avoid alcohol, sedatives and other CNS depressants that may worsen sleep apnea and disrupt normal sleep architecture. - Sleep hygiene should be reviewed to assess factors that may improve sleep quality. - Weight management (BMI 42) and regular exercise should be initiated or continued. - Recommend a download in 30 days and sleep clinic evaluation after 4 weeks of therapy.     ASSESSMENT:    1. OSA (obstructive sleep apnea)   2. Primary hypertension   3. Paroxysmal atrial fibrillation [facility]): Status post A-fib ablation, Dr. Elberta Fortis, October 15, 2021   4. History of CVA (cerebrovascular accident)   5. Chronic anticoagulation   6. 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 Elberta Fortis.  Due to concerns for obstructive sleep apnea, she initially was referred for a diagnostic polysomnogram which was done on March 16, 2021 and showed mild overall sleep apnea with an AHI of 13.2, moderate sleep apnea during supine sleep at 26.2/h, and severe sleep apnea during REM sleep with an AHI of 58.2/h.  She had significant oxygen desaturation  to a nadir of 73%.  During the COVID days, there was significant delay in scheduling.  She finally had a CPAP titration study done on August 29, 2021.  She was titrated up to 14 cm water pressure.  Of note, O2 nadir was 70% at 10 cm water pressure.  She was in atrial fibrillation during the procedure.  It was recommended she initiate CPAP therapy with a pressure range of 13 to 18 cm of water.  Due to severe supply chain issues with chips, it took her 14 months to get a CPAP machine.  She finally received a ResMed AirSense 11 AutoSet unit on November 14, 2022.  Her initial download confirms that she met compliance standards.  At 13 to 18 cm pressure range, AHI was 6.1 and her 95th percentile pressure was 17.4 and maximum average pressure was 17.5.  Her most recent download from March 31 through March 25, 2023 shows noncompliance with only 53% of usage days.  AHI was 14.1 and again 95th percentile pressure was 17.5.  I had a lengthy discussion with her today regarding sleep apnea to provide education and more comprehensive understanding. I discussed normal sleep architecture and the disruptive sleep architecture associated with obstructive sleep apnea.  I reviewed in detail the effect on cardiovascular health if patients have untreated sleep apnea with particular reference to its effects on hypertension, nocturnal arrhythmias with increased sympathetic tone contributing to palpitations as well as increased atrial fibrillation risk.  I discussed its effects on insulin resistance contributing to increased glucose, GERD, as well as potential increased inflammation.  In addition we discussed nocturnal hypoxemia at contributing potentially to nocturnal ischemia both cardiac as well as cerebrovascular.  I also discussed association with frequent nocturia seen in patients with untreated sleep apnea and the benefit of therapy.  She has been using a fullface mask and has had some nasal irritation and some air blowing into her  eyes when she is trying to read in bed before turning the lights off.  I provided her with a new sample mask which I believe will be improved and gave her a ResMed AirFit F30i mask sample.  Presently, she has been going to bed at 11 PM and wakes up at 6 AM.  I discussed optimal sleep duration at 7 and 9 hours and the importance of using CPAP for the nights entirety particularly since the preponderance of REM sleep occurs in the second half of the night.  On her sleep study, she had severe sleep apnea during REM sleep with an AHI of 58.2/h.  Her blood pressure today is mildly elevated and I have suggested slight titration of amlodipine from 5 mg up to 7.5 mg.  She will be following up with her primary physician in 2 months and depending upon blood pressure further titration can be done at that time.  Her EKG today shows sinus rhythm with sinus arrhythmia.  She is not aware of any recurrent atrial fibrillation since undergoing her atrial fibrillation ablation.  However I discussed with her there is increased risk for recurrent AF following ablations if the patient's sleep apnea is not treated.  We discussed the importance of weight loss and exercise.  She remains active.  BMI is consistent with morbid obesity.  She feels improved since initiating CPAP therapy.  I have recommended more optimal compliance.  I will change her CPAP setting to initiate at 15 cm of water to a range up to 20 which should further improve her slightly increased residual AHI.  I will increase her ramp start pressure to 6.  I will see her in 1 year for follow-up evaluation or sooner as needed.   Medication Adjustments/Labs and Tests Ordered: Current medicines are reviewed at length with the patient today.  Concerns regarding medicines are outlined above.  Medication changes, Labs and Tests ordered today are listed in the Patient Instructions below. Patient Instructions  Medication Instructions:  Take Amlodipine 1.5 tablet (7.5 mg)   *If  you need a refill on your cardiac medications before your next appointment, please call your pharmacy*  Follow-Up: At [facility], you and your health needs are our priority.  As part of our continuing mission to provide you with exceptional heart care, we have created designated Provider Care Teams.  These Care Teams include your primary Cardiologist (physician) and Advanced Practice Providers (APPs -  Physician Assistants and Nurse Practitioners) who all work together to provide you with the care you need, when you need it.  We recommend signing up for the patient portal called "MyChart".  Sign up information is provided on this After Visit Summary.  MyChart is used to connect with patients for Virtual Visits (Telemedicine).  Patients are able to view lab/test results, encounter notes, upcoming appointments, etc.  Non-urgent messages can be sent to your provider as well.   To learn more about what you can do with MyChart, go to [email or web address].    Your next appointment:   12 month(s)  Provider:   Nicki Guadalajara, MD      Signed, Nicki Guadalajara, MD, [facility], ABSM Diplomate, American Board of Sleep Medicine  03/26/2023 12:21 PM    [facility] [address], [address], [place], [place]  [address] Phone: [phone]

## 2023-04-05 ENCOUNTER — Other Ambulatory Visit: Payer: Self-pay | Admitting: Cardiology

## 2023-04-15 DIAGNOSIS — G4733 Obstructive sleep apnea (adult) (pediatric): Secondary | ICD-10-CM | POA: Diagnosis not present

## 2023-04-29 NOTE — Assessment & Plan Note (Signed)
Management per specialist. Currently on eliquis and metoprolol.

## 2023-04-29 NOTE — Progress Notes (Signed)
Subjective:  Patient ID: Teresa Blair, female    DOB: 10-15-1951  Age: 72 y.o. MRN: [id number]  Chief Complaint  Patient presents with   Medical Management of Chronic Issues    HPI   Patient presents for follow up of hypertension.  Patient tolerating metoprolol and amlodipine well.  She has been having mildly increased swelling since amlodipine 5 mg 1 1/2 daily.  Patient is working on maintaining diet and exercise regimen and follows up as directed.   Patient presents with hyperlipidemia and aortic atherosclerosis.  Aortic atherosclerosis found in 2022. History of stroke Compliance with treatment has been good; patient takes medicines as directed, maintains low cholesterol diet, follows up as directed, and maintains exercise regimen.  Patient is using repatha without problems.   Patient has a diagnosis of chronic atrial fibrillation.   Patient is on toprol xl 25 mg one tab in am and 1/2 tab before bed and eliquis 5 mg twice daily and has controlled ventricular response. Patient has a small PFO with Left to Right shunting. Patient sees Dr. Servando Salina.   OSA: uses cpap.   Patient has history of OA and fracture of right hip. Patient has a history of a stroke that affected the left side. She has some left sided weakness. Her gait is abnormal, but she compensates well.   Morbid obesity: eats healthy. 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 Has a fatty liver found on ct in 2022.      04/30/2023    7:41 AM 08/23/2021    8:10 AM 07/04/2021    9:39 AM 12/01/2020    9:28 AM  Depression screen PHQ 2/9  Decreased Interest 0 0 0 0  Down, Depressed, Hopeless 0 0 0 0  PHQ - 2 Score 0 0 0 0  Altered sleeping 1     Tired, decreased energy 0     Change in appetite 1     Feeling bad or failure about yourself  0     Trouble concentrating 0     Moving slowly or fidgety/restless 0     Suicidal thoughts 0     PHQ-9 Score 2     Difficult doing work/chores  Not difficult at all           04/30/2023    7:41 AM  Fall Risk   Falls in the past year? 1  Number falls in past yr: 1  Injury with Fall? 1  Risk for fall due to : Impaired mobility  Follow up Falls evaluation completed;Falls prevention discussed    Patient Care Team: CoxFritzi Mandes, MD as PCP - General (Family Medicine) Thomasene Ripple, DO as PCP - Cardiology (Cardiology) Regan Lemming, MD as PCP - Endocrinology (Cardiology)   Review of Systems  Constitutional:  Negative for chills, fatigue and fever.  HENT:  Negative for congestion, rhinorrhea and sore throat.   Respiratory:  Negative for cough and shortness of breath.   Cardiovascular:  Positive for palpitations (chronic atrial fibrillation) and leg swelling. Negative for chest pain.  Gastrointestinal:  Negative for abdominal pain, constipation, diarrhea, nausea and vomiting.  Genitourinary:  Negative for dysuria and urgency.  Musculoskeletal:  Positive for arthralgias (chronic right hip pain). Negative for back pain and myalgias.  Neurological:  Negative for dizziness, light-headedness and headaches.  Psychiatric/Behavioral:  Negative for dysphoric mood. The patient is not nervous/anxious.     Current Outpatient Medications on File Prior to Visit  Medication Sig Dispense Refill   amLODipine (NORVASC) 5 MG tablet Take 1.5 tablets (7.5 mg total) by mouth daily. 135 tablet 1   diclofenac Sodium (VOLTAREN) 1 % GEL Apply 1 application topically daily as needed (pain).     ELIQUIS 5 MG TABS tablet TAKE 1 TABLET(5 MG) BY MOUTH TWICE DAILY 60 tablet 5   metoprolol succinate (TOPROL-XL) 25 MG 24 hr tablet TAKE 1 TABLET BY MOUTH EVERY MORNING AND 1/2 TABLET EVERY EVENING 135 tablet 2   REPATHA SURECLICK 140 MG/ML SOAJ ADMINISTER 1 ML UNDER THE SKIN EVERY 14 DAYS 2 mL 11   No current facility-administered medications on file prior to visit.   Past Medical History:  Diagnosis Date   A-fib [facility])    Anxiety 12/01/2020   Asthma    as  a child   Atrial fibrillation (HCC) 09/16/2020   Chronic anticoagulation 12/01/2020   Closed fracture of right acetabulum (HCC) 09/03/2011   Enthesopathy of hip region 09/03/2011   Gestational diabetes    Headache    History of CVA (cerebrovascular accident) 09/16/2020   Hypercholesterolemia    Hypertension    Kidney stones    Mixed hyperlipidemia 09/16/2020   Morbid obesity (HCC) 09/16/2020   Obesity    Osteoarthritis    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   Primary hypertension 09/16/2020   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   Right hip pain 09/03/2011   Scleroderma, localized    Sequelae, 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   Stroke (HCC) 09/12/2020   left side weakness/speech affected   Wears glasses    Past Surgical History:  Procedure Laterality Date   ATRIAL FIBRILLATION ABLATION N/A 10/17/2021   Procedure: ATRIAL FIBRILLATION ABLATION;  Surgeon: Regan Lemming, MD;  Location: [facility] INVASIVE CV LAB;  Service: Cardiovascular;  Laterality: N/A;   BUBBLE STUDY  05/04/2021   Procedure: BUBBLE STUDY;  Surgeon: Parke Poisson, MD;  Location: [facility] ENDOSCOPY;  Service: Cardiovascular;;   CARDIOVERSION N/A 05/04/2021   Procedure: CARDIOVERSION;  Surgeon: Parke Poisson, MD;  Location: [facility] ENDOSCOPY;  Service: Cardiovascular;  Laterality: N/A;   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 06/08/2015   Procedure: CONVERSION TO TOTAL HIP, ANTERIOR TOTAL HIP;  Surgeon: Mckinley Jewel, MD;  Location: [facility] OR;  Service: Orthopedics;  Laterality: Right;   FRACTURE SURGERY     right hip   TEE WITHOUT CARDIOVERSION N/A 05/04/2021   Procedure: TRANSESOPHAGEAL ECHOCARDIOGRAM (TEE);  Surgeon: Parke Poisson, MD;  Location: [facility] ENDOSCOPY;  Service: Cardiovascular;  Laterality: N/A;   TOTAL HIP ARTHROPLASTY Right 06/08/2015   TUBAL LIGATION     WISDOM TOOTH EXTRACTION      Family History  Problem Relation Age of Onset   Cancer Mother    Cancer Father    Hypertension Other     Social History   Socioeconomic History   Marital status: Divorced    Spouse name: Not on file   Number of children: 3   Years of education: Not on file   Highest education level: Not on file  Occupational History    Comment: full time  Tobacco Use   Smoking status: Never   Smokeless tobacco: Never  Vaping Use   Vaping Use: Never used  Substance and Sexual Activity   Alcohol use: No   Drug use: No   Sexual activity: Not Currently  Other Topics Concern   Not on file  Social History Narrative   Lives alone   Right Handed   Drinks no caffeine  Social Determinants of Health   Financial Resource Strain: Not on file  Food Insecurity: Not on file  Transportation Needs: Not on file  Physical Activity: Not on file  Stress: Not on file  Social Connections: Not on file    Objective:  BP (!) 136/58   Pulse 72   Temp (!) 96.7 F (35.9 C)   Resp 16   Ht 5\' 5"  (1.651 m)   Wt 254 lb (115.2 kg)   LMP 02/25/2015 (Approximate)   BMI 42.27 kg/m      04/30/2023    7:35 AM 03/26/2023   10:15 AM 01/15/2023    3:09 PM  BP/Weight  Systolic BP 136 138 126  Diastolic BP 58 78 60  Wt. (Lbs) 254 256 252  BMI 42.27 kg/m2 42.6 kg/m2 41.93 kg/m2    Physical Exam Vitals reviewed.  Constitutional:      Appearance: Normal appearance. She is normal weight.  Neck:     Vascular: No carotid bruit.  Cardiovascular:     Rate and Rhythm: Normal rate. Rhythm irregular.     Heart sounds: Normal heart sounds.  Pulmonary:     Effort: Pulmonary effort is normal. No respiratory distress.     Breath sounds: Normal breath sounds.  Abdominal:     General: Abdomen is flat. Bowel sounds are normal.     Palpations: Abdomen is soft.     Tenderness: There is no abdominal tenderness.  Neurological:     Mental Status: She is alert and oriented to person, place, and time.     Gait: Gait abnormal.  Psychiatric:        Mood and Affect: Mood normal.        Behavior: Behavior normal.      Diabetic Foot Exam - Simple   No data filed      Lab Results  Component Value Date   WBC 5.1 05/03/2023   HGB 13.4 05/03/2023   HCT 41.2 05/03/2023   PLT 203 05/03/2023   GLUCOSE 94 05/03/2023   CHOL 118 05/03/2023   TRIG 63 05/03/2023   HDL 56 05/03/2023   LDLCALC 48 05/03/2023   ALT 28 05/03/2023   AST 22 05/03/2023   NA 143 05/03/2023   K 4.2 05/03/2023   CL 104 05/03/2023   CREATININE 0.56 (L) 05/03/2023   BUN 12 05/03/2023   CO2 23 05/03/2023   INR 1.01 05/26/2015      Assessment & Plan:    Primary hypertension Assessment & Plan: Well controlled.  No changes to medicines. Metoprolol 25 mg xl every morning and 12.5 mg every evening, amlodipine 7.5 mg daily. Continue to work on eating a healthy diet and exercise.  Labs drawn today.    Orders: -     CBC with Differential/Platelet; Future -     Comprehensive metabolic panel; Future  Mixed hyperlipidemia Assessment & Plan: Well controlled.  No changes to medicines. Repatha. Continue to work on eating a healthy diet and exercise.  Labs drawn today.    Orders: -     Lipid panel; Future  Persistent atrial fibrillation [facility]) Assessment & Plan: Management per specialist. Currently on eliquis and metoprolol.   Class 3 severe obesity due to excess calories with serious comorbidity and body mass index (BMI) of 40.0 to 44.9 in adult [facility]) Assessment & Plan: Continue to work on diet and exercise.     Acquired thrombophilia (HCC) Assessment & Plan: Continue eliquis due to atrial fibrillation   OSA (obstructive sleep apnea)  Assessment & Plan: Continue cpap.    Post-traumatic osteoarthritis of right hip Assessment & Plan: Has pain, but prefers no medicines.  Tries to avoid medicines.    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 Continue eliquis.    Aortic atherosclerosis (HCC) Assessment & Plan: The current medical  regimen is effective;  continue present plan and medications. Continue repatha 140 mg every 2 weeks.       No orders of the defined types were placed in this encounter.   Orders Placed This Encounter  Procedures   CBC with Differential/Platelet   Comprehensive metabolic panel   Lipid panel     Follow-up: Return in about 6 months (around 10/30/2023) for chronic follow up, fasting.   I,Marla I Leal-Borjas,acting as a scribe for Blane Ohara, MD.,have documented all relevant documentation on the behalf of Blane Ohara, MD,as directed by  Blane Ohara, MD while in the presence of Blane Ohara, MD.   An After Visit Summary was printed and given to the patient.  Blane Ohara, MD [facility] [phone]

## 2023-04-29 NOTE — Assessment & Plan Note (Addendum)
Well controlled.  No changes to medicines. Metoprolol 25 mg xl every morning and 12.5 mg every evening, amlodipine 7.5 mg daily. Continue to work on eating a healthy diet and exercise.  Labs drawn today.

## 2023-04-29 NOTE — Assessment & Plan Note (Signed)
Well controlled.  No changes to medicines. Repatha Continue to work on eating a healthy diet and exercise.  Labs drawn today.   

## 2023-04-30 ENCOUNTER — Ambulatory Visit (INDEPENDENT_AMBULATORY_CARE_PROVIDER_SITE_OTHER): Payer: PPO | Admitting: Family Medicine

## 2023-04-30 VITALS — BP 136/58 | HR 72 | Temp 96.7°F | Resp 16 | Ht 65.0 in | Wt 254.0 lb

## 2023-04-30 DIAGNOSIS — I700 Atherosclerosis of aorta: Secondary | ICD-10-CM

## 2023-04-30 DIAGNOSIS — I679 Cerebrovascular disease, unspecified: Secondary | ICD-10-CM

## 2023-04-30 DIAGNOSIS — G8194 Hemiplegia, unspecified affecting left nondominant side: Secondary | ICD-10-CM

## 2023-04-30 DIAGNOSIS — I10 Essential (primary) hypertension: Secondary | ICD-10-CM | POA: Diagnosis not present

## 2023-04-30 DIAGNOSIS — I4819 Other persistent atrial fibrillation: Secondary | ICD-10-CM

## 2023-04-30 DIAGNOSIS — G4733 Obstructive sleep apnea (adult) (pediatric): Secondary | ICD-10-CM | POA: Diagnosis not present

## 2023-04-30 DIAGNOSIS — Z6841 Body Mass Index (BMI) 40.0 and over, adult: Secondary | ICD-10-CM | POA: Diagnosis not present

## 2023-04-30 DIAGNOSIS — E782 Mixed hyperlipidemia: Secondary | ICD-10-CM

## 2023-04-30 DIAGNOSIS — D6869 Other thrombophilia: Secondary | ICD-10-CM | POA: Diagnosis not present

## 2023-04-30 DIAGNOSIS — M1651 Unilateral post-traumatic osteoarthritis, right hip: Secondary | ICD-10-CM | POA: Diagnosis not present

## 2023-04-30 NOTE — Patient Instructions (Signed)
Healthy eating.

## 2023-04-30 NOTE — Assessment & Plan Note (Signed)
Continue to work on diet and exercise

## 2023-05-01 ENCOUNTER — Other Ambulatory Visit: Payer: Self-pay

## 2023-05-03 ENCOUNTER — Other Ambulatory Visit: Payer: PPO

## 2023-05-03 DIAGNOSIS — E782 Mixed hyperlipidemia: Secondary | ICD-10-CM | POA: Diagnosis not present

## 2023-05-03 DIAGNOSIS — I10 Essential (primary) hypertension: Secondary | ICD-10-CM

## 2023-05-04 LAB — COMPREHENSIVE METABOLIC PANEL
ALT: 28 IU/L (ref 0–32)
AST: 22 IU/L (ref 0–40)
Albumin/Globulin Ratio: 2 (ref 1.2–2.2)
Albumin: 4.4 g/dL (ref 3.9–4.9)
Alkaline Phosphatase: 74 IU/L (ref 44–121)
BUN/Creatinine Ratio: 21 (ref 12–28)
BUN: 12 mg/dL (ref 8–27)
Bilirubin Total: 0.4 mg/dL (ref 0.0–1.2)
CO2: 23 mmol/L (ref 20–29)
Calcium: 9.3 mg/dL (ref 8.7–10.3)
Chloride: 104 mmol/L (ref 96–106)
Creatinine, Ser: 0.56 mg/dL — ABNORMAL LOW (ref 0.57–1.00)
Globulin, Total: 2.2 g/dL (ref 1.5–4.5)
Glucose: 94 mg/dL (ref 70–99)
Potassium: 4.2 mmol/L (ref 3.5–5.2)
Sodium: 143 mmol/L (ref 134–144)
Total Protein: 6.6 g/dL (ref 6.0–8.5)
eGFR: 98 mL/min/{1.73_m2} (ref >59)

## 2023-05-04 LAB — LIPID PANEL
Chol/HDL Ratio: 2.1 ratio (ref 0.0–4.4)
Cholesterol, Total: 118 mg/dL (ref 100–199)
HDL: 56 mg/dL (ref >39)
LDL Chol Calc (NIH): 48 mg/dL (ref 0–99)
Triglycerides: 63 mg/dL (ref 0–149)
VLDL Cholesterol Cal: 14 mg/dL (ref 5–40)

## 2023-05-04 LAB — CBC WITH DIFFERENTIAL/PLATELET
Basophils Absolute: 0 10*3/uL (ref 0.0–0.2)
Basos: 1 %
EOS (ABSOLUTE): 0.1 10*3/uL (ref 0.0–0.4)
Eos: 2 %
Hematocrit: 41.2 % (ref 34.0–46.6)
Hemoglobin: 13.4 g/dL (ref 11.1–15.9)
Immature Grans (Abs): 0 10*3/uL (ref 0.0–0.1)
Immature Granulocytes: 0 %
Lymphocytes Absolute: 1.4 10*3/uL (ref 0.7–3.1)
Lymphs: 28 %
MCH: 27.6 pg (ref 26.6–33.0)
MCHC: 32.5 g/dL (ref 31.5–35.7)
MCV: 85 fL (ref 79–97)
Monocytes Absolute: 0.5 10*3/uL (ref 0.1–0.9)
Monocytes: 10 %
Neutrophils Absolute: 3 10*3/uL (ref 1.4–7.0)
Neutrophils: 59 %
Platelets: 203 10*3/uL (ref 150–450)
RBC: 4.85 x10E6/uL (ref 3.77–5.28)
RDW: 12.1 % (ref 11.7–15.4)
WBC: 5.1 10*3/uL (ref 3.4–10.8)

## 2023-05-07 ENCOUNTER — Encounter: Payer: Self-pay | Admitting: Family Medicine

## 2023-05-07 DIAGNOSIS — G8194 Hemiplegia, unspecified affecting left nondominant side: Secondary | ICD-10-CM | POA: Insufficient documentation

## 2023-05-07 DIAGNOSIS — I700 Atherosclerosis of aorta: Secondary | ICD-10-CM | POA: Insufficient documentation

## 2023-05-07 HISTORY — DX: Hemiplegia, unspecified affecting left nondominant side: G81.94

## 2023-05-07 NOTE — Assessment & Plan Note (Signed)
Has pain, but prefers no medicines.  Tries to avoid medicines.

## 2023-05-07 NOTE — Assessment & Plan Note (Signed)
Continue eliquis due to atrial fibrillation. °

## 2023-05-07 NOTE — Assessment & Plan Note (Signed)
The current medical regimen is effective;  continue present plan and medications. Continue repatha 140 mg every 2 weeks.

## 2023-05-07 NOTE — Assessment & Plan Note (Signed)
Compensating well. Continue eliquis.

## 2023-05-07 NOTE — Assessment & Plan Note (Signed)
Continue cpap.  

## 2023-05-16 DIAGNOSIS — G4733 Obstructive sleep apnea (adult) (pediatric): Secondary | ICD-10-CM | POA: Diagnosis not present

## 2023-06-15 DIAGNOSIS — G4733 Obstructive sleep apnea (adult) (pediatric): Secondary | ICD-10-CM | POA: Diagnosis not present

## 2023-06-17 IMAGING — CT CT HEART MORPH/PULM VEIN W/ CM & W/O CA SCORE
1 series · 10 of 12 positions shown, 13 images · non-contrast
Comparison: None.
COMPARISON: None.

Addendum:
EXAM:
OVER-READ INTERPRETATION  CT CHEST

The following report is an over-read performed by radiologist Dr.
Jh-F Volvo [REDACTED] on 10/10/2021. This
over-read does not include interpretation of cardiac or coronary
anatomy or pathology. The coronary CTA interpretation by the
cardiologist is attached.
CLINICAL DATA: Atrial fibrillation scheduled for ablation.
Cardiac CTA
TECHNIQUE: A non-contrast, gated CT scan was obtained with axial slices of 3 mm
through the heart for calcium scoring. Calcium scoring was performed
using the Agatston method. A 130 kV prospective, gated, contrast
cardiac scan was obtained. Gantry rotation speed was 250 msecs and
collimation was 0.6 mm. Nitroglycerin was not given. A delayed scan
was obtained to exclude left atrial appendage thrombus. The 3D
dataset was reconstructed in 5% intervals of the 25-50% of the R-R
cycle. Late systolic phases were analyzed on a dedicated workstation
using MPR, MIP, and VRT modes. The patient received 80 cc of
contrast.

[Series 229: findings · 0.34mm/px · 10 of 12 slices shown, 13 images]
[im 2/12  vessel]
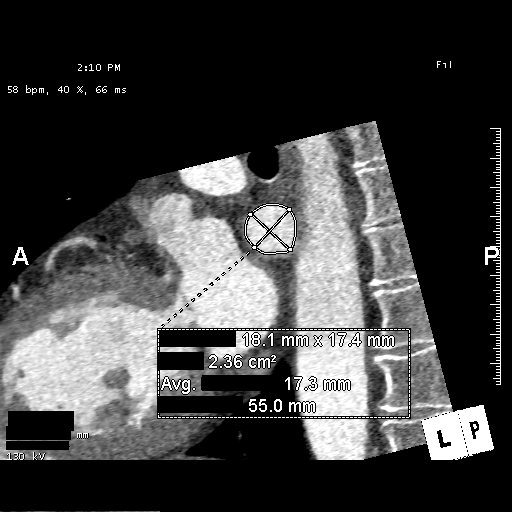
[im 2/12  lung]
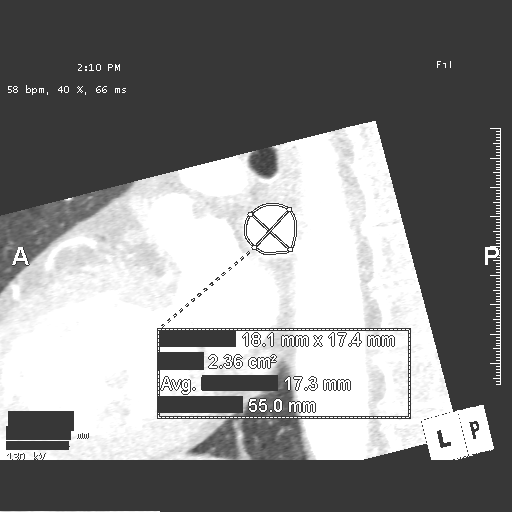
[im 3/12  vessel]
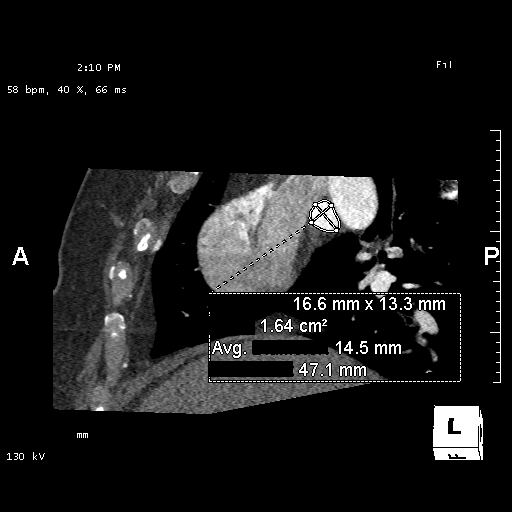
[im 4/12  vessel]
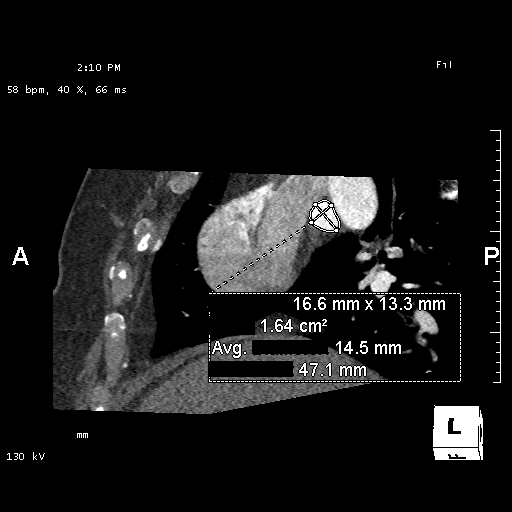
[im 5/12  vessel]
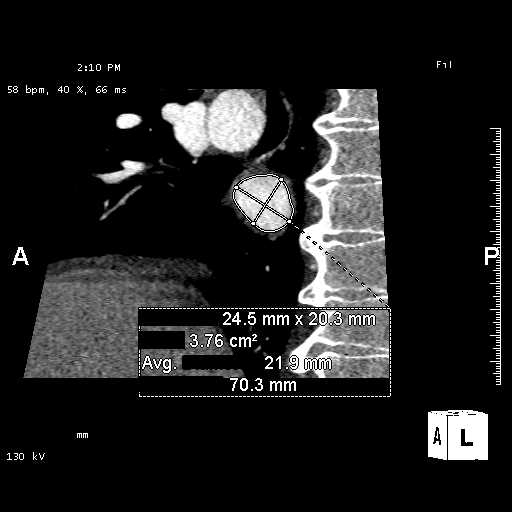
[im 6/12  vessel]
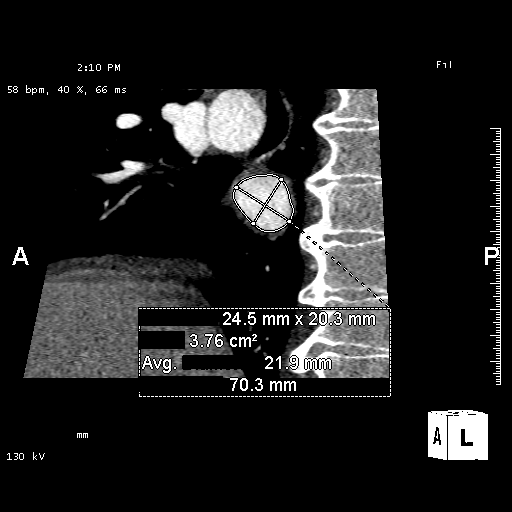
[im 6/12  lung]
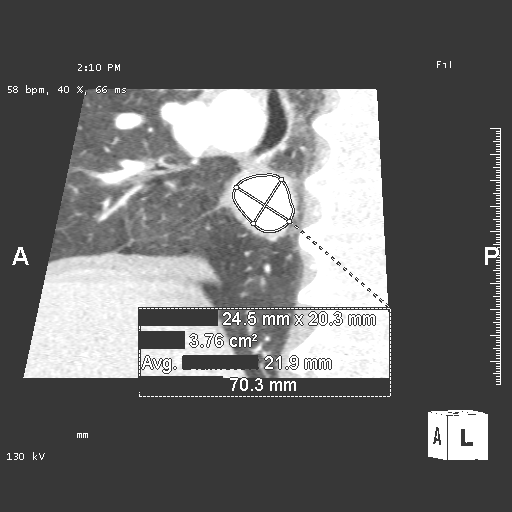
[im 7/12  vessel]
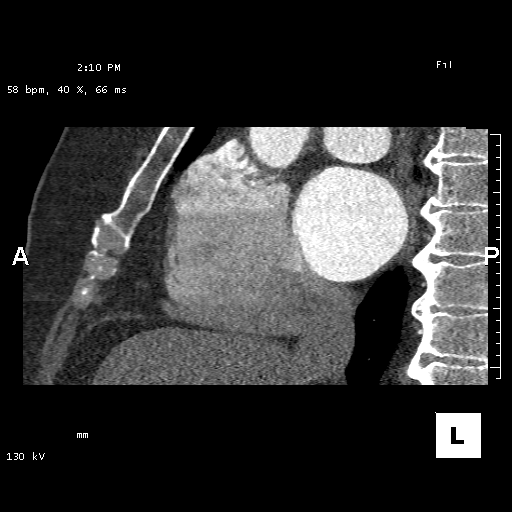
[im 8/12  vessel]
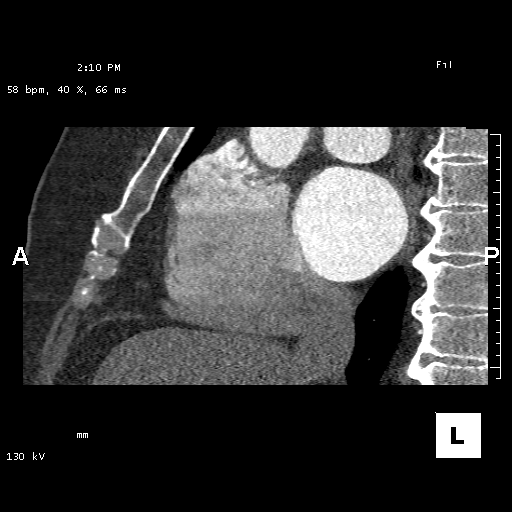
[im 9/12  vessel]
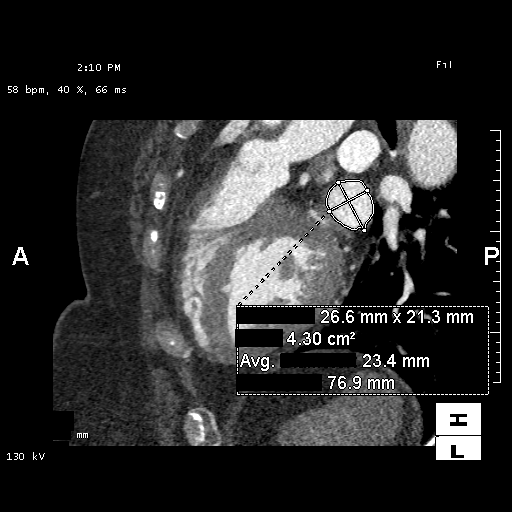
[im 10/12  vessel]
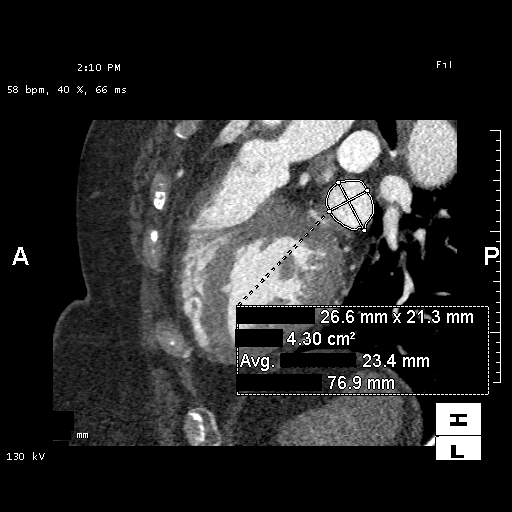
[im 10/12  lung]
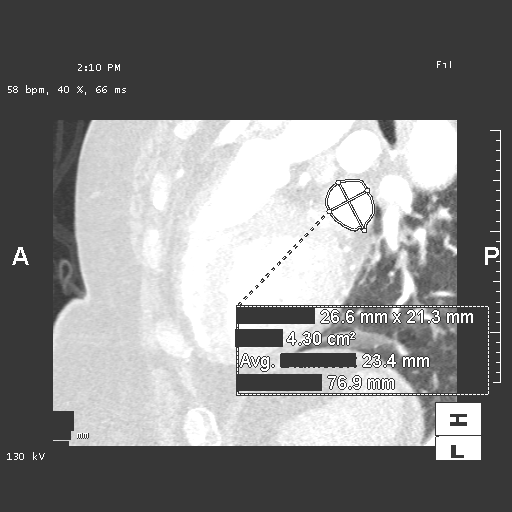
[im 11/12  vessel]
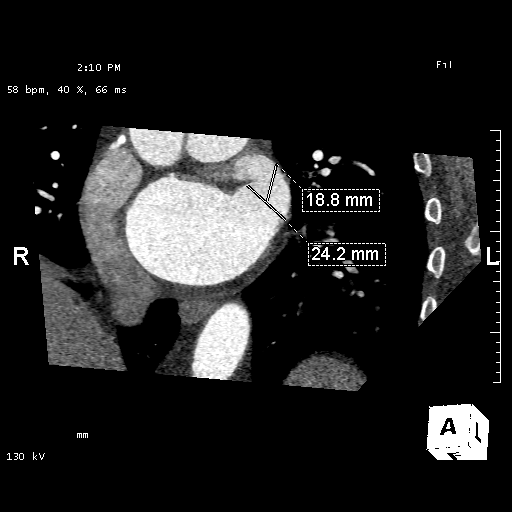

[10 of 12 positions shown; findings below may reference images not displayed]

FINDINGS: Vascular: No significant noncardiac vascular findings.

Mediastinum/Nodes: Visualized mediastinum and hilar regions
demonstrate no lymphadenopathy or masses.

Lungs/Pleura: Visualized lungs show no evidence of pulmonary edema,
consolidation, pneumothorax, nodule or pleural fluid.

Upper Abdomen: No acute abnormality.

Musculoskeletal: No chest wall mass or suspicious bone lesions
identified.
IMPRESSION: No significant incidental findings.
FINDINGS: Image quality: Excellent.

Noise artifact is: Limited.

Pulmonary Veins: There is normal pulmonary vein drainage into the
left atrium (2 on the right and 2 on the left) with ostial
measurements as follows:

RUPV: Ostium 17 mm x 13 mm  area 164 mm2

RLPV:  Ostium 25 mm x 20 mm  area 376 mm2

LUPV:  Ostium 22.5 mm x 18 mm area 329 mm2

LLPV:  Ostium 18 mm x 17 mm  area 236 mm2

Left Atrium: The left atrial size is dilated. There is a small PFO
with L to R shunting. The left atrial appendage is large chicken
wing type with two lobes and ostial size 26.6 mm x 21.3 mm mm and
length 18.8 mm. There is no thrombus in the left atrial appendage on
delayed imaging. The esophagus runs in the left atrial midline and
is not in proximity to any of the pulmonary vein ostia.

Coronary Arteries: CAC score of 31, which is 62nd percentile for
age-, race-, and sex-matched controls. Normal coronary origin. Right
dominance. The study was performed without use of NTG and is
insufficient for plaque evaluation.

Right Atrium: Right atrial size is dilated.

Right Ventricle: The right ventricular cavity is within normal
limits.

Left Ventricle: The ventricular cavity size is within normal limits.
There are no stigmata of prior infarction. There is no abnormal
filling defect.

Pericardium: Normal thickness with no significant effusion or
calcium present.

Pulmonary Artery: Normal caliber without proximal filling defect.

Cardiac valves: The aortic valve is trileaflet without significant
calcification. The mitral valve is normal structure without
significant calcification.

Aorta: Normal caliber with no significant disease.

Extra-cardiac findings: See attached radiology report for
non-cardiac structures.
IMPRESSION: 1. There is normal pulmonary vein drainage into the left atrium with
ostial measurements above.

2. There is no thrombus in the left atrial appendage.

3. The esophagus runs in the left atrial midline and is not in
proximity to any of the pulmonary vein ostia.

4. There is a small PFO with L to R shunting.

5. Normal coronary origin. Right dominance.

6. CAC score of 31, which is 62nd percentile for age-, race-, and
sex-matched controls.

*** End of Addendum ***
EXAM:
OVER-READ INTERPRETATION  CT CHEST

The following report is an over-read performed by radiologist Dr.
Jh-F Volvo [REDACTED] on 10/10/2021. This
over-read does not include interpretation of cardiac or coronary
anatomy or pathology. The coronary CTA interpretation by the
cardiologist is attached.
FINDINGS: Vascular: No significant noncardiac vascular findings.

Mediastinum/Nodes: Visualized mediastinum and hilar regions
demonstrate no lymphadenopathy or masses.

Lungs/Pleura: Visualized lungs show no evidence of pulmonary edema,
consolidation, pneumothorax, nodule or pleural fluid.

Upper Abdomen: No acute abnormality.

Musculoskeletal: No chest wall mass or suspicious bone lesions
identified.
IMPRESSION: No significant incidental findings.

## 2023-06-27 ENCOUNTER — Ambulatory Visit: Payer: PPO

## 2023-06-30 ENCOUNTER — Other Ambulatory Visit: Payer: Self-pay | Admitting: Cardiology

## 2023-06-30 DIAGNOSIS — I4819 Other persistent atrial fibrillation: Secondary | ICD-10-CM

## 2023-07-01 NOTE — Telephone Encounter (Signed)
Prescription refill request for Eliquis received. Indication:afib Last office visit:9/23 Scr:0.56  6/24 Age: 72 Weight:115.2  kg  Prescription refilled

## 2023-07-16 DIAGNOSIS — G4733 Obstructive sleep apnea (adult) (pediatric): Secondary | ICD-10-CM | POA: Diagnosis not present

## 2023-08-16 DIAGNOSIS — G4733 Obstructive sleep apnea (adult) (pediatric): Secondary | ICD-10-CM | POA: Diagnosis not present

## 2023-08-20 DIAGNOSIS — G4733 Obstructive sleep apnea (adult) (pediatric): Secondary | ICD-10-CM | POA: Diagnosis not present

## 2023-08-28 ENCOUNTER — Other Ambulatory Visit: Payer: Self-pay | Admitting: Cardiology

## 2023-09-15 DIAGNOSIS — G4733 Obstructive sleep apnea (adult) (pediatric): Secondary | ICD-10-CM | POA: Diagnosis not present

## 2023-09-19 DIAGNOSIS — G4733 Obstructive sleep apnea (adult) (pediatric): Secondary | ICD-10-CM | POA: Diagnosis not present

## 2023-10-02 ENCOUNTER — Ambulatory Visit: Payer: PPO

## 2023-10-02 VITALS — Ht 65.0 in | Wt 238.0 lb

## 2023-10-02 DIAGNOSIS — Z780 Asymptomatic menopausal state: Secondary | ICD-10-CM | POA: Diagnosis not present

## 2023-10-02 DIAGNOSIS — Z0000 Encounter for general adult medical examination without abnormal findings: Secondary | ICD-10-CM

## 2023-10-02 DIAGNOSIS — Z1159 Encounter for screening for other viral diseases: Secondary | ICD-10-CM

## 2023-10-02 DIAGNOSIS — Z1231 Encounter for screening mammogram for malignant neoplasm of breast: Secondary | ICD-10-CM

## 2023-10-02 NOTE — Patient Instructions (Addendum)
Ms. Ohanian , Thank you for taking time to come for your Medicare Wellness Visit. I appreciate your ongoing commitment to your health goals. Please review the following plan we discussed and let me know if I can assist you in the future.   Referrals/Orders/Follow-Ups/Clinician Recommendations: Get the tetanus and pneumonia vaccines at your earliest convenience. Consider getting a RSV vaccine. I have placed an order for a mammogram and bone density test. Call [facility] scheduling at [phone] at your earliest convenience. I placed an order for Hepatitis C screening to be done with your next labwork. If you do not receive the cologuard kit before you see Dr. Sedalia Muta on 10/30/23, request she place an order for it.  This is a list of the screening recommended for you and due dates:  Health Maintenance  Topic Date Due   Hepatitis C Screening  Never done   DTaP/Tdap/Td vaccine (1 - Tdap) Never done   Zoster (Shingles) Vaccine (1 of 2) Never done   Colon Cancer Screening  Never done   Mammogram  Never done   Pneumonia Vaccine (2 of 2 - PCV) 02/19/2018   DEXA scan (bone density measurement)  Never done   Flu Shot  02/24/2024*   Medicare Annual Wellness Visit  10/01/2024   HPV Vaccine  Aged Out   COVID-19 Vaccine  Discontinued  *Topic was postponed. The date shown is not the original due date.    Advanced directives: (Copy Requested) Please bring a copy of your health care power of attorney and living will to the office to be added to your chart at your convenience.  Next Medicare Annual Wellness Visit scheduled for next year: Yes, 10/06/24 @ 3:00pm  Fall Prevention in the Home, Adult Falls can cause injuries and affect people of all ages. There are many simple things that you can do to make your home safe and to help prevent falls. If you need it, ask for help making these changes. What actions can I take to prevent falls? General information Use good lighting in all rooms. Make  sure to: Replace any light bulbs that burn out. Turn on lights if it is dark and use night-lights. Keep items that you use often in easy-to-reach places. Lower the shelves around your home if needed. Move furniture so that there are clear paths around it. Do not keep throw rugs or other things on the floor that can make you trip. If any of your floors are uneven, fix them. Add color or contrast paint or tape to clearly mark and help you see: Grab bars or handrails. First and last steps of staircases. Where the edge of each step is. If you use a ladder or stepladder: Make sure that it is fully opened. Do not climb a closed ladder. Make sure the sides of the ladder are locked in place. Have someone hold the ladder while you use it. Know where your pets are as you move through your home. What can I do in the bathroom?     Keep the floor dry. Clean up any water that is on the floor right away. Remove soap buildup in the bathtub or shower. Buildup makes bathtubs and showers slippery. Use non-skid mats or decals on the floor of the bathtub or shower. Attach bath mats securely with double-sided, non-slip rug tape. If you need to sit down while you are in the shower, use a non-slip stool. Install grab bars by the toilet and in the bathtub and shower. Do  not use towel bars as grab bars. What can I do in the bedroom? Make sure that you have a light by your bed that is easy to reach. Do not use any sheets or blankets on your bed that hang to the floor. Have a firm bench or chair with side arms that you can use for support when you get dressed. What can I do in the kitchen? Clean up any spills right away. If you need to reach something above you, use a sturdy step stool that has a grab bar. Keep electrical cables out of the way. Do not use floor polish or wax that makes floors slippery. What can I do with my stairs? Do not leave anything on the stairs. Make sure that you have a light switch  at the top and the bottom of the stairs. Have them installed if you do not have them. Make sure that there are handrails on both sides of the stairs. Fix handrails that are broken or loose. Make sure that handrails are as long as the staircases. Install non-slip stair treads on all stairs in your home if they do not have carpet. Avoid having throw rugs at the top or bottom of stairs, or secure the rugs with carpet tape to prevent them from moving. Choose a carpet design that does not hide the edge of steps on the stairs. Make sure that carpet is firmly attached to the stairs. Fix any carpet that is loose or worn. What can I do on the outside of my home? Use bright outdoor lighting. Repair the edges of walkways and driveways and fix any cracks. Clear paths of anything that can make you trip, such as tools or rocks. Add color or contrast paint or tape to clearly mark and help you see high doorway thresholds. Trim any bushes or trees on the main path into your home. Check that handrails are securely fastened and in good repair. Both sides of all steps should have handrails. Install guardrails along the edges of any raised decks or porches. Have leaves, snow, and ice cleared regularly. Use sand, salt, or ice melt on walkways during winter months if you live where there is ice and snow. In the garage, clean up any spills right away, including grease or oil spills. What other actions can I take? Review your medicines with your health care provider. Some medicines can make you confused or feel dizzy. This can increase your chance of falling. Wear closed-toe shoes that fit well and support your feet. Wear shoes that have rubber soles and low heels. Use a cane, walker, scooter, or crutches that help you move around if needed. Talk with your provider about other ways that you can decrease your risk of falls. This may include seeing a physical therapist to learn to do exercises to improve movement and  strength. Where to find more information Centers for Disease Control and Prevention, STEADI: TonerPromos.no [employer] on Aging: BaseRingTones.pl National Institute on Aging: BaseRingTones.pl Contact a health care provider if: You are afraid of falling at home. You feel weak, drowsy, or dizzy at home. You fall at home. Get help right away if you: Lose consciousness or have trouble moving after a fall. Have a fall that causes a head injury. These symptoms may be an emergency. Get help right away. Call 911. Do not wait to see if the symptoms will go away. Do not drive yourself to the hospital. This information is not intended to replace advice given to you  by your health care provider. Make sure you discuss any questions you have with your health care provider. Document Revised: 07/16/2022 Document Reviewed: 07/16/2022 Elsevier Patient Education  2024 [employer].
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
## 2023-10-06 ENCOUNTER — Other Ambulatory Visit: Payer: Self-pay | Admitting: Family Medicine

## 2023-10-06 DIAGNOSIS — Z1211 Encounter for screening for malignant neoplasm of colon: Secondary | ICD-10-CM

## 2023-10-16 DIAGNOSIS — G4733 Obstructive sleep apnea (adult) (pediatric): Secondary | ICD-10-CM | POA: Diagnosis not present

## 2023-10-20 DIAGNOSIS — G4733 Obstructive sleep apnea (adult) (pediatric): Secondary | ICD-10-CM | POA: Diagnosis not present

## 2023-10-20 DIAGNOSIS — Z1211 Encounter for screening for malignant neoplasm of colon: Secondary | ICD-10-CM | POA: Diagnosis not present

## 2023-10-21 ENCOUNTER — Ambulatory Visit: Payer: PPO | Admitting: Cardiology

## 2023-10-29 NOTE — Progress Notes (Signed)
Subjective:  Patient ID: Teresa Blair, female    DOB: [birth year]  Age: 72 y.o. MRN: [id number]  Chief Complaint  Patient presents with   Medical Management of Chronic Issues    HPI History of Present Illness The patient, with a history of stroke and sleep apnea. Patient developed leg swelling and a sensation of heaviness in the chest after increasing her amlodipine from 5mg  to 7.5 mg daily last spring. The patient decided to reduce the dosage back to 5mg , which alleviated the swelling and chest heaviness. The patient's blood pressure averages 128/80, slightly above the target of 120 set by her Dr. Tresa Endo, her cardiologist.   The patient also discusses her struggle with sleep apnea and use of a CPAP machine. Despite regular use, the patient does not notice a significant difference in her daytime energy levels. She expresses interest in a CPAP implant but has not discussed with Dr. Tresa Endo.  Patient is working on maintaining diet and exercise regimen and follows up as directed.   Patient presents with hyperlipidemia and aortic atherosclerosis.  Aortic atherosclerosis found in 2022. History of stroke. Compliance with treatment has been good; patient takes medicines as directed, maintains low cholesterol diet, follows up as directed, and maintains exercise regimen.  Patient is using repatha without problems.   Patient has a diagnosis of chronic atrial fibrillation.   Patient is on toprol xl 25 mg one tab in am and 1/2 tab before bed and eliquis 5 mg twice daily and has controlled ventricular response. Patient has a small PFO with Left to Right shunting. Patient sees Dr. Elberta Fortis.  Patient has history of OA and fracture of right hip. Patient has a history of a stroke that affected the left side. She has some left sided weakness. Her gait is abnormal, but she compensates well.   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 Has a fatty liver found on ct in 2022.       10/30/2023    7:33 AM 10/02/2023    2:43 PM 04/30/2023    7:41 AM 08/23/2021    8:10 AM 07/04/2021    9:39 AM  Depression screen PHQ 2/9  Decreased Interest 0 0 0 0 0  Down, Depressed, Hopeless 0 0 0 0 0  PHQ - 2 Score 0 0 0 0 0  Altered sleeping 0 0 1    Tired, decreased energy 0 0 0    Change in appetite 0 0 1    Feeling bad or failure about yourself  0 0 0    Trouble concentrating 0 0 0    Moving slowly or fidgety/restless 0 0 0    Suicidal thoughts 0 0 0    PHQ-9 Score 0 0 2    Difficult doing work/chores Not difficult at all Not difficult at all Not difficult at all          10/30/2023    7:33 AM  Fall Risk   Falls in the past year? 1  Number falls in past yr: 1  Injury with Fall? 0  Risk for fall due to : Impaired mobility;Impaired balance/gait  Follow up Falls evaluation completed    Patient Care Team: Blane Ohara, MD as PCP - General (Family Medicine) Regan Lemming, MD as PCP - Endocrinology (Cardiology)   Review of Systems  Constitutional:  Negative for chills, fatigue and fever.  HENT:  Negative for congestion, ear pain and sore throat.   Respiratory:  Negative for cough and shortness of breath.   Cardiovascular:  Negative for chest pain and leg swelling.  Gastrointestinal:  Negative for abdominal pain, constipation, diarrhea, nausea and vomiting.  Genitourinary:  Negative for dysuria and urgency.  Musculoskeletal:  Positive for gait problem. Negative for arthralgias and myalgias.  Skin:  Negative for rash.  Neurological:  Negative for dizziness and headaches.  Psychiatric/Behavioral:  Negative for dysphoric mood. The patient is not nervous/anxious.     Current Outpatient Medications on File Prior to Visit  Medication Sig Dispense Refill   diclofenac Sodium (VOLTAREN) 1 % GEL Apply 1 application  topically daily as needed (pain).     ELIQUIS 5 MG TABS tablet TAKE 1 TABLET(5 MG) BY MOUTH TWICE DAILY 60 tablet 5   REPATHA SURECLICK 140 MG/ML SOAJ ADMINISTER  1 ML UNDER THE SKIN EVERY 14 DAYS 2 mL 11   No current facility-administered medications on file prior to visit.   Past Medical History:  Diagnosis Date   A-fib [facility])    Anxiety 12/01/2020   Asthma    as a child   Atrial fibrillation (HCC) 09/16/2020   Chronic anticoagulation 12/01/2020   Closed fracture of right acetabulum (HCC) 09/03/2011   Enthesopathy of hip region 09/03/2011   Gestational diabetes    Headache    History of CVA (cerebrovascular accident) 09/16/2020   Hypercholesterolemia    Hypertension    Kidney stones    Mixed hyperlipidemia 09/16/2020   Morbid obesity (HCC) 09/16/2020   Obesity    Osteoarthritis    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   Primary hypertension 09/16/2020   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   Right hip pain 09/03/2011   Scleroderma, localized    Sequelae, 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   Stroke (HCC) 09/12/2020   left side weakness/speech affected   Wears glasses    Past Surgical History:  Procedure Laterality Date   ATRIAL FIBRILLATION ABLATION N/A 10/17/2021   Procedure: ATRIAL FIBRILLATION ABLATION;  Surgeon: Regan Lemming, MD;  Location: [facility] INVASIVE CV LAB;  Service: Cardiovascular;  Laterality: N/A;   BUBBLE STUDY  05/04/2021   Procedure: BUBBLE STUDY;  Surgeon: Parke Poisson, MD;  Location: [facility] ENDOSCOPY;  Service: Cardiovascular;;   CARDIOVERSION N/A 05/04/2021   Procedure: CARDIOVERSION;  Surgeon: Parke Poisson, MD;  Location: [facility] ENDOSCOPY;  Service: Cardiovascular;  Laterality: N/A;   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 06/08/2015   Procedure: CONVERSION TO TOTAL HIP, ANTERIOR TOTAL HIP;  Surgeon: Mckinley Jewel, MD;  Location: [facility] OR;  Service: Orthopedics;  Laterality: Right;   FRACTURE SURGERY     right hip   TEE WITHOUT CARDIOVERSION N/A 05/04/2021   Procedure: TRANSESOPHAGEAL ECHOCARDIOGRAM (TEE);  Surgeon: Parke Poisson, MD;  Location: [facility] ENDOSCOPY;  Service: Cardiovascular;  Laterality:  N/A;   TOTAL HIP ARTHROPLASTY Right 06/08/2015   TUBAL LIGATION     WISDOM TOOTH EXTRACTION      Family History  Problem Relation Age of Onset   Cancer Mother    Cancer Father    Hypertension Other    Social History   Socioeconomic History   Marital status: Divorced    Spouse name: Not on file   Number of children: 3   Years of education: Not on file   Highest education level: Not on file  Occupational History    Comment: full time  Tobacco Use   Smoking status: Never   Smokeless tobacco: Never  Vaping Use   Vaping status: Never Used  Substance and Sexual Activity   Alcohol use: No   Drug use: No   Sexual activity: Not Currently  Other Topics Concern   Not on file  Social History Narrative   Lives alone   Right Handed   Drinks no caffeine   Social Determinants of Health   Financial Resource Strain: Low Risk  (10/02/2023)   Overall Financial Resource Strain (CARDIA)    Difficulty of Paying Living Expenses: Not hard at all  Food Insecurity: No Food Insecurity (10/02/2023)   Hunger Vital Sign    Worried About Running Out of Food in the Last Year: Never true    Ran Out of Food in the Last Year: Never true  Transportation Needs: No Transportation Needs (10/02/2023)   PRAPARE - Administrator, Civil Service (Medical): No    Lack of Transportation (Non-Medical): No  Physical Activity: Insufficiently Active (10/02/2023)   Exercise Vital Sign    Days of Exercise per Week: 7 days    Minutes of Exercise per Session: 10 min  Stress: Stress Concern Present (10/02/2023)   [employer] of Occupational Health - Occupational Stress Questionnaire    Feeling of Stress : Rather much  Social Connections: Moderately Integrated (10/02/2023)   Social Connection and Isolation Panel [NHANES]    Frequency of Communication with Friends and Family: More than three times a week    Frequency of Social Gatherings with Friends and Family: More than three times a week    Attends  Religious Services: More than 4 times per year    Active Member of [employer] or Organizations: Yes    Attends Engineer, structural: More than 4 times per year    Marital Status: Divorced    Objective:  BP 138/72 (BP Location: Right Arm, Patient Position: Sitting)   Pulse 89   Temp 98.7 F (37.1 C)   Ht 5\' 5"  (1.651 m)   Wt 245 lb (111.1 kg)   LMP 02/25/2015 (Approximate)   SpO2 96%   BMI 40.77 kg/m      10/30/2023    7:30 AM 10/02/2023    2:32 PM 04/30/2023    7:35 AM  BP/Weight  Systolic BP 138 -- 136  Diastolic BP 72 -- 58  Wt. (Lbs) 245 238 254  BMI 40.77 kg/m2 39.61 kg/m2 42.27 kg/m2    Physical Exam Vitals reviewed.  Constitutional:      Appearance: Normal appearance. She is obese.  Neck:     Vascular: No carotid bruit.  Cardiovascular:     Rate and Rhythm: Normal rate and regular rhythm.     Heart sounds: Normal heart sounds.  Pulmonary:     Effort: Pulmonary effort is normal. No respiratory distress.     Breath sounds: Normal breath sounds.  Abdominal:     General: Abdomen is flat. Bowel sounds are normal.     Palpations: Abdomen is soft.     Tenderness: There is no abdominal tenderness.  Neurological:     Mental Status: She is alert and oriented to person, place, and time.  Psychiatric:        Mood and Affect: Mood normal.        Behavior: Behavior normal.     Diabetic Foot Exam - Simple   No data filed      Lab Results  Component Value Date   WBC 6.4 10/30/2023   HGB 14.3 10/30/2023   HCT 45.5 10/30/2023   PLT 241 10/30/2023   GLUCOSE 102 (H) 10/30/2023  CHOL 161 10/30/2023   TRIG 99 10/30/2023   HDL 59 10/30/2023   LDLCALC 84 10/30/2023   ALT 21 10/30/2023   AST 15 10/30/2023   NA 143 10/30/2023   K 4.5 10/30/2023   CL 102 10/30/2023   CREATININE 0.65 10/30/2023   BUN 11 10/30/2023   CO2 25 10/30/2023   INR 1.01 05/26/2015      Assessment & Plan:    Primary hypertension Assessment & Plan: Starting Valsartan 40mg  once  daily. continue amlodipine and metoprolol at current doses.   Orders: -     CBC with Differential/Platelet -     Comprehensive metabolic panel -     Valsartan; Take 1 tablet (40 mg total) by mouth daily.  Dispense: 90 tablet; Refill: 0 -     amLODIPine Besylate; Take 1 tablet (5 mg total) by mouth daily.  Dispense: 90 tablet; Refill: 1 -     Metoprolol Succinate ER; TAKE 1 TABLET BY MOUTH EVERY MORNING AND 1/2 TABLET EVERY EVENING. Please keep scheduled appointment for future refills. Thank you.  Dispense: 135 tablet; Refill: 1  Mixed hyperlipidemia Assessment & Plan: Well controlled.  No changes to medicines. Repatha. Continue to work on eating a healthy diet and exercise.  Labs drawn today.    Orders: -     Lipid panel  Encounter for hepatitis C screening test for low risk patient -     HCV Ab w Reflex to Quant PCR  Persistent atrial fibrillation (HCC) Assessment & Plan: Management per specialist. Currently on eliquis and metoprolol.   Class 3 severe obesity due to excess calories with serious comorbidity and body mass index (BMI) of 40.0 to 44.9 in adult [facility]) Assessment & Plan: Continue to work on diet and exercise.     Aortic atherosclerosis (HCC) Assessment & Plan: The current medical regimen is effective;  continue present plan and medications. Continue repatha 140 mg every 2 weeks.    OSA (obstructive sleep apnea) Assessment & Plan: Referring to Dr. Tresa Endo, a sleep specialist, to discuss and evaluate the possibility of a CPAP implant.   Encounter for immunization -     Flu Vaccine Trivalent High Dose (Fluad)  Immunization due -     Pneumococcal conjugate vaccine 20-valent  Other orders -     Interpretation:     Meds ordered this encounter  Medications   valsartan (DIOVAN) 40 MG tablet    Sig: Take 1 tablet (40 mg total) by mouth daily.    Dispense:  90 tablet    Refill:  0   amLODipine (NORVASC) 5 MG tablet    Sig: Take 1 tablet (5 mg total) by  mouth daily.    Dispense:  90 tablet    Refill:  1   metoprolol succinate (TOPROL-XL) 25 MG 24 hr tablet    Sig: TAKE 1 TABLET BY MOUTH EVERY MORNING AND 1/2 TABLET EVERY EVENING. Please keep scheduled appointment for future refills. Thank you.    Dispense:  135 tablet    Refill:  1    Orders Placed This Encounter  Procedures   Flu Vaccine Trivalent High Dose (Fluad)   Pneumococcal conjugate vaccine 20-valent (Prevnar 20)   CBC with Differential/Platelet   Comprehensive metabolic panel   Lipid panel   HCV Ab w Reflex to Quant PCR   Interpretation:     Follow-up: Return in about 6 months (around 04/29/2024) for chronic follow up.   Clayborn Bigness I Leal-Borjas,acting as a scribe for Blane Ohara, MD.,have documented  all relevant documentation on the behalf of Blane Ohara, MD,as directed by  Blane Ohara, MD while in the presence of Blane Ohara, MD.   An After Visit Summary was printed and given to the patient.  Blane Ohara, MD [facility] [phone]

## 2023-10-30 ENCOUNTER — Encounter: Payer: Self-pay | Admitting: Family Medicine

## 2023-10-30 ENCOUNTER — Ambulatory Visit (INDEPENDENT_AMBULATORY_CARE_PROVIDER_SITE_OTHER): Payer: PPO | Admitting: Family Medicine

## 2023-10-30 VITALS — BP 138/72 | HR 89 | Temp 98.7°F | Ht 65.0 in | Wt 245.0 lb

## 2023-10-30 DIAGNOSIS — G4733 Obstructive sleep apnea (adult) (pediatric): Secondary | ICD-10-CM

## 2023-10-30 DIAGNOSIS — I4819 Other persistent atrial fibrillation: Secondary | ICD-10-CM

## 2023-10-30 DIAGNOSIS — Z1159 Encounter for screening for other viral diseases: Secondary | ICD-10-CM | POA: Diagnosis not present

## 2023-10-30 DIAGNOSIS — E66813 Obesity, class 3: Secondary | ICD-10-CM | POA: Diagnosis not present

## 2023-10-30 DIAGNOSIS — I10 Essential (primary) hypertension: Secondary | ICD-10-CM | POA: Diagnosis not present

## 2023-10-30 DIAGNOSIS — Z23 Encounter for immunization: Secondary | ICD-10-CM

## 2023-10-30 DIAGNOSIS — I700 Atherosclerosis of aorta: Secondary | ICD-10-CM | POA: Diagnosis not present

## 2023-10-30 DIAGNOSIS — E782 Mixed hyperlipidemia: Secondary | ICD-10-CM | POA: Diagnosis not present

## 2023-10-30 DIAGNOSIS — Z6841 Body Mass Index (BMI) 40.0 and over, adult: Secondary | ICD-10-CM

## 2023-10-30 MED ORDER — METOPROLOL SUCCINATE ER 25 MG PO TB24: ORAL_TABLET | ORAL | 1 refills | Status: DC

## 2023-10-30 MED ORDER — VALSARTAN 40 MG PO TABS: 40.0000 mg | ORAL_TABLET | Freq: Every day | ORAL | 0 refills | Status: DC

## 2023-10-30 MED ORDER — AMLODIPINE BESYLATE 5 MG PO TABS: 5.0000 mg | ORAL_TABLET | Freq: Every day | ORAL | 1 refills | Status: DC

## 2023-10-30 NOTE — Patient Instructions (Signed)
VISIT SUMMARY:  During today's visit, we discussed your recent symptoms of leg swelling and chest heaviness, which occurred after increasing your medication dosage. You also shared your ongoing challenges with sleep apnea and your interest in a CPAP implant. We reviewed your history of stroke and hip pain, and we addressed your current blood pressure management.  YOUR PLAN:  -HYPERTENSION: Hypertension, or high blood pressure, is when the force of the blood against your artery walls is too high.  I recommend starting Valsartan 40mg  once daily. continue amlodipine and metoprolol at current doses. Please check your blood pressure regularly and provide an update in a couple of weeks.  -OBSTRUCTIVE SLEEP APNEA: Obstructive Sleep Apnea is a condition where your breathing stops and starts during sleep. Despite using a CPAP machine, you do not feel more alert during the day. We will refer you to Dr. Tresa Endo, a sleep specialist, to discuss and evaluate the possibility of a CPAP implant.  -GENERAL HEALTH MAINTENANCE: We administered your flu shot and Prevnar 20 vaccine today. Please schedule a mammogram after the Christmas holidays. We have also ordered a Hepatitis C screening and routine blood work. Your Metoprolol and Amlodipine prescriptions have been refilled. Follow up after Christmas to assess your response to Valsartan.  INSTRUCTIONS:  Please check your blood pressure regularly and provide an update in a couple of weeks. Schedule a mammogram after the Christmas holidays. Follow up after Christmas to assess your response to Valsartan. Please follow up with Dr. Tresa Endo, a sleep specialist, for further discussion and evaluation of a CPAP implant.

## 2023-10-31 LAB — CBC WITH DIFFERENTIAL/PLATELET
Basophils Absolute: 0 10*3/uL (ref 0.0–0.2)
Basos: 0 %
EOS (ABSOLUTE): 0.1 10*3/uL (ref 0.0–0.4)
Eos: 1 %
Hematocrit: 45.5 % (ref 34.0–46.6)
Hemoglobin: 14.3 g/dL (ref 11.1–15.9)
Immature Grans (Abs): 0 10*3/uL (ref 0.0–0.1)
Immature Granulocytes: 0 %
Lymphocytes Absolute: 1.6 10*3/uL (ref 0.7–3.1)
Lymphs: 25 %
MCH: 27.6 pg (ref 26.6–33.0)
MCHC: 31.4 g/dL — ABNORMAL LOW (ref 31.5–35.7)
MCV: 88 fL (ref 79–97)
Monocytes Absolute: 0.5 10*3/uL (ref 0.1–0.9)
Monocytes: 8 %
Neutrophils Absolute: 4.2 10*3/uL (ref 1.4–7.0)
Neutrophils: 66 %
Platelets: 241 10*3/uL (ref 150–450)
RBC: 5.19 x10E6/uL (ref 3.77–5.28)
RDW: 11.9 % (ref 11.7–15.4)
WBC: 6.4 10*3/uL (ref 3.4–10.8)

## 2023-10-31 LAB — COMPREHENSIVE METABOLIC PANEL
ALT: 21 [IU]/L (ref 0–32)
AST: 15 [IU]/L (ref 0–40)
Albumin: 4.4 g/dL (ref 3.9–4.9)
Alkaline Phosphatase: 84 [IU]/L (ref 44–121)
BUN/Creatinine Ratio: 17 (ref 12–28)
BUN: 11 mg/dL (ref 8–27)
Bilirubin Total: 0.3 mg/dL (ref 0.0–1.2)
CO2: 25 mmol/L (ref 20–29)
Calcium: 9.4 mg/dL (ref 8.7–10.3)
Chloride: 102 mmol/L (ref 96–106)
Creatinine, Ser: 0.65 mg/dL (ref 0.57–1.00)
Globulin, Total: 2.7 g/dL (ref 1.5–4.5)
Glucose: 102 mg/dL — ABNORMAL HIGH (ref 70–99)
Potassium: 4.5 mmol/L (ref 3.5–5.2)
Sodium: 143 mmol/L (ref 134–144)
Total Protein: 7.1 g/dL (ref 6.0–8.5)
eGFR: 95 mL/min/{1.73_m2} (ref >59)

## 2023-10-31 LAB — LIPID PANEL
Chol/HDL Ratio: 2.7 {ratio} (ref 0.0–4.4)
Cholesterol, Total: 161 mg/dL (ref 100–199)
HDL: 59 mg/dL (ref >39)
LDL Chol Calc (NIH): 84 mg/dL (ref 0–99)
Triglycerides: 99 mg/dL (ref 0–149)
VLDL Cholesterol Cal: 18 mg/dL (ref 5–40)

## 2023-10-31 LAB — HCV INTERPRETATION

## 2023-10-31 LAB — HCV AB W REFLEX TO QUANT PCR: HCV Ab: NONREACTIVE

## 2023-10-31 LAB — COLOGUARD: COLOGUARD: NEGATIVE

## 2023-11-02 NOTE — Assessment & Plan Note (Signed)
Well controlled.  No changes to medicines. Repatha Continue to work on eating a healthy diet and exercise.  Labs drawn today.

## 2023-11-02 NOTE — Assessment & Plan Note (Addendum)
Starting Valsartan 40mg  once daily. continue amlodipine and metoprolol at current doses.

## 2023-11-02 NOTE — Assessment & Plan Note (Signed)
The current medical regimen is effective;  continue present plan and medications. Continue repatha 140 mg every 2 weeks.

## 2023-11-02 NOTE — Assessment & Plan Note (Signed)
Continue to work on diet and exercise

## 2023-11-02 NOTE — Assessment & Plan Note (Signed)
Management per specialist. Currently on eliquis and metoprolol.

## 2023-11-02 NOTE — Assessment & Plan Note (Signed)
Referring to Dr. Tresa Endo, a sleep specialist, to discuss and evaluate the possibility of a CPAP implant.

## 2023-11-15 DIAGNOSIS — G4733 Obstructive sleep apnea (adult) (pediatric): Secondary | ICD-10-CM | POA: Diagnosis not present

## 2023-11-19 DIAGNOSIS — G4733 Obstructive sleep apnea (adult) (pediatric): Secondary | ICD-10-CM | POA: Diagnosis not present

## 2023-12-02 ENCOUNTER — Ambulatory Visit: Payer: PPO | Attending: Cardiology | Admitting: Cardiology

## 2023-12-02 ENCOUNTER — Other Ambulatory Visit: Payer: Self-pay | Admitting: Cardiology

## 2023-12-02 ENCOUNTER — Encounter: Payer: Self-pay | Admitting: Cardiology

## 2023-12-02 VITALS — BP 120/66 | HR 97 | Ht 65.0 in | Wt 246.6 lb

## 2023-12-02 DIAGNOSIS — I10 Essential (primary) hypertension: Secondary | ICD-10-CM

## 2023-12-02 DIAGNOSIS — I4819 Other persistent atrial fibrillation: Secondary | ICD-10-CM | POA: Diagnosis not present

## 2023-12-02 DIAGNOSIS — D6869 Other thrombophilia: Secondary | ICD-10-CM | POA: Diagnosis not present

## 2023-12-02 DIAGNOSIS — G4733 Obstructive sleep apnea (adult) (pediatric): Secondary | ICD-10-CM

## 2023-12-02 NOTE — Patient Instructions (Addendum)
 Medication Instructions:  Your physician has recommended you make the following change in your medication: STOP Valsartanr  *If you need a refill on your cardiac medications before your next appointment, please call your pharmacy*   Lab Work: None ordered   Testing/Procedures: None ordered   Follow-Up: At [facility], you and your health needs are our priority.  As part of our continuing mission to provide you with exceptional heart care, we have created designated Provider Care Teams.  These Care Teams include your primary Cardiologist (physician) and Advanced Practice Providers (APPs -  Physician Assistants and Nurse Practitioners) who all work together to provide you with the care you need, when you need it.  Your next appointment:   1 year(s)  The format for your next appointment:   In Person  Provider:   Soyla Norton, MD    Thank you for choosing [facility]!!   Maeola Domino, RN [phone]

## 2023-12-02 NOTE — Progress Notes (Signed)
  Electrophysiology Office Note:   Date:  12/02/2023  ID:  Teresa Blair, DOB 18-Oct-1951, MRN [id number]  Primary Cardiologist: None Primary Heart Failure: None Electrophysiologist: Teresa Romick Gladis Norton, MD      History of Present Illness:   Teresa Blair is a 73 y.o. female with h/o atrial fibrillation, CVA, hypertension, obesity, sleep apnea seen today for routine electrophysiology followup.   Since last being seen in our clinic the patient reports doing overall well.  She has no chest pain or shortness of breath.  She is able to do all of her daily activities.  She had 1 episode of atrial fibrillation that occurred when she was in very cold water snorkeling.  She also had bronchitis and possibly COVID.  She has not had any other episodes.  she denies chest pain, palpitations, dyspnea, PND, orthopnea, nausea, vomiting, dizziness, syncope, edema, weight gain, or early satiety.   Review of systems complete and found to be negative unless listed in HPI.   EP Information / Studies Reviewed:    EKG is ordered today. Personal review as below.  EKG Interpretation Date/Time:  Monday December 02 2023 15:05:57 EST Ventricular Rate:  97 PR Interval:  186 QRS Duration:  82 QT Interval:  350 QTC Calculation: 444 R Axis:   56  Text Interpretation: Sinus rhythm with occasional Premature ventricular complexes When compared with ECG of 01-Nov-2021 15:57, Premature ventricular complexes are now Present Vent. rate has increased BY  39 BPM Confirmed by Teresa Blair ([id number]) on 12/02/2023 3:11:38 PM     Risk Assessment/Calculations:    CHA2DS2-VASc Score = 5   This indicates a 7.2% annual risk of stroke. The patient's score is based upon: CHF History: 0 HTN History: 1 Diabetes History: 0 Stroke History: 2 Vascular Disease History: 0 Age Score: 1 Gender Score: 1            Physical Exam:   VS:  BP 120/66   Pulse 97   Ht 5' 5 (1.651 m)   Wt 246 lb 9.6 oz (111.9 kg)   LMP 02/25/2015  (Approximate)   SpO2 96%   BMI 41.04 kg/m    Wt Readings from Last 3 Encounters:  12/02/23 246 lb 9.6 oz (111.9 kg)  10/30/23 245 lb (111.1 kg)  10/02/23 238 lb (108 kg)     GEN: Well nourished, well developed in no acute distress NECK: No JVD; No carotid bruits CARDIAC: Regular rate and rhythm, no murmurs, rubs, gallops RESPIRATORY:  Clear to auscultation without rales, wheezing or rhonchi  ABDOMEN: Soft, non-tender, non-distended EXTREMITIES:  No edema; No deformity   ASSESSMENT AND PLAN:    1.  Persistent atrial fibrillation: Post ablation 10/17/2021.  Mains in sinus rhythm.  Had 1 episode of atrial fibrillation when she was sick with bronchitis and COVID.  Teresa Blair continue with current management.  2.  Obstructive sleep apnea: CPAP compliance encouraged  3.  Hypertension: Currently well-controlled.  She was previously switched to valsartan .  She is continuing to take amlodipine .  Teresa Blair stop valsartan  and continue amlodipine  as blood pressure is well-controlled.  4.  Secondary hypercoagulable state: Currently on Eliquis  for atrial fibrillation  5.  Hyperlipidemia: Continue Repatha   Follow up with Dr. Norton in 12 months  Signed, Teresa Insco Gladis Norton, MD

## 2023-12-02 NOTE — Telephone Encounter (Signed)
 Prescription refill request for Eliquis received. Indication:afib Last office visit:upcoming Scr:0.65  12/24 Age: 73 Weight:111.1  kg  Prescription refilled

## 2023-12-06 ENCOUNTER — Telehealth: Payer: Self-pay | Admitting: Cardiology

## 2023-12-06 NOTE — Telephone Encounter (Signed)
 [facility] calling in to start PA on Repatha

## 2023-12-09 ENCOUNTER — Telehealth: Payer: Self-pay | Admitting: Pharmacy Technician

## 2023-12-09 ENCOUNTER — Other Ambulatory Visit (HOSPITAL_COMMUNITY): Payer: Self-pay

## 2023-12-09 NOTE — Telephone Encounter (Signed)
 Pharmacy Patient Advocate Encounter   Received notification from Pt Calls Messages that prior authorization for repatha  is required/requested.   Insurance verification completed.   The patient is insured through [facility] AD[employer]/RX ADVANCE .   Per test claim: PA required; PA submitted to above mentioned insurance via CoverMyMeds Key/confirmation #/EOC [id number] Status is pending

## 2023-12-09 NOTE — Telephone Encounter (Signed)
 Pharmacy Patient Advocate Encounter  Received notification from [employer] that Prior Authorization for repatha  has been APPROVED from 12/09/23 to 12/08/24. Ran test claim, Copay is $12.15 one month. This test claim was processed through [facility]- copay amounts may vary at other pharmacies due to pharmacy/plan contracts, or as the patient moves through the different stages of their insurance plan.   PA #/Case ID/Reference #: [id number]

## 2023-12-20 DIAGNOSIS — G4733 Obstructive sleep apnea (adult) (pediatric): Secondary | ICD-10-CM | POA: Diagnosis not present

## 2023-12-27 ENCOUNTER — Encounter: Payer: Self-pay | Admitting: Cardiovascular Disease

## 2023-12-27 DIAGNOSIS — D6869 Other thrombophilia: Secondary | ICD-10-CM

## 2023-12-27 DIAGNOSIS — I4819 Other persistent atrial fibrillation: Secondary | ICD-10-CM

## 2023-12-27 DIAGNOSIS — I10 Essential (primary) hypertension: Secondary | ICD-10-CM

## 2023-12-27 DIAGNOSIS — G4733 Obstructive sleep apnea (adult) (pediatric): Secondary | ICD-10-CM

## 2024-01-21 ENCOUNTER — Ambulatory Visit (HOSPITAL_BASED_OUTPATIENT_CLINIC_OR_DEPARTMENT_OTHER)
Admission: RE | Admit: 2024-01-21 | Discharge: 2024-01-21 | Disposition: A | Discharge status: Home / Self Care | Payer: PPO | Source: Ambulatory Visit | Attending: Family Medicine | Admitting: Family Medicine

## 2024-01-21 ENCOUNTER — Other Ambulatory Visit (HOSPITAL_BASED_OUTPATIENT_CLINIC_OR_DEPARTMENT_OTHER): Payer: PPO | Admitting: Radiology

## 2024-01-21 ENCOUNTER — Ambulatory Visit (HOSPITAL_BASED_OUTPATIENT_CLINIC_OR_DEPARTMENT_OTHER): Payer: PPO | Admitting: Radiology

## 2024-01-21 ENCOUNTER — Encounter (HOSPITAL_BASED_OUTPATIENT_CLINIC_OR_DEPARTMENT_OTHER): Payer: Self-pay | Admitting: Radiology

## 2024-01-21 DIAGNOSIS — Z780 Asymptomatic menopausal state: Secondary | ICD-10-CM

## 2024-01-21 DIAGNOSIS — Z1231 Encounter for screening mammogram for malignant neoplasm of breast: Secondary | ICD-10-CM

## 2024-01-23 ENCOUNTER — Other Ambulatory Visit (HOSPITAL_BASED_OUTPATIENT_CLINIC_OR_DEPARTMENT_OTHER): Payer: PPO | Admitting: Radiology

## 2024-01-23 ENCOUNTER — Encounter: Payer: Self-pay | Admitting: Family Medicine

## 2024-01-23 ENCOUNTER — Ambulatory Visit (HOSPITAL_BASED_OUTPATIENT_CLINIC_OR_DEPARTMENT_OTHER): Payer: PPO | Admitting: Radiology

## 2024-01-27 ENCOUNTER — Ambulatory Visit: Payer: Self-pay | Admitting: Family Medicine

## 2024-01-27 NOTE — Telephone Encounter (Signed)
 Copied from CRM [id number]. Topic: Clinical - Lab/Test Results >> Jan 27, 2024  3:47 PM Clayton Bibles wrote: Reason for CRM: [name] has question about test results    Patient returned phone call to the office in regards to test results.Patient was notified of the information in Dr. Renea Ee result note from 3/2:   "Right breast normal. Possible mass in the left breast.  RECOMMENDATION: Diagnostic mammogram and possibly ultrasound of the left breast. Dr. Sedalia Muta"  Patient is asking how to schedule follow up imaging. Patient is aware that a message is being sent to the office.

## 2024-01-28 NOTE — Telephone Encounter (Signed)
 Left message for patient to call back also informed her that if she would like she can send a message via mychart.

## 2024-01-31 ENCOUNTER — Ambulatory Visit

## 2024-01-31 ENCOUNTER — Ambulatory Visit: Payer: PPO

## 2024-01-31 VITALS — BP 136/84 | HR 91 | Ht 65.0 in | Wt 256.6 lb

## 2024-01-31 DIAGNOSIS — E782 Mixed hyperlipidemia: Secondary | ICD-10-CM | POA: Diagnosis not present

## 2024-01-31 DIAGNOSIS — I10 Essential (primary) hypertension: Secondary | ICD-10-CM | POA: Diagnosis not present

## 2024-01-31 DIAGNOSIS — R9431 Abnormal electrocardiogram [ECG] [EKG]: Secondary | ICD-10-CM

## 2024-01-31 DIAGNOSIS — I4819 Other persistent atrial fibrillation: Secondary | ICD-10-CM

## 2024-01-31 HISTORY — DX: Abnormal electrocardiogram (ECG) (EKG): R94.31

## 2024-01-31 LAB — ECHOCARDIOGRAM COMPLETE
Area-P 1/2: 2.92 cm2
Height: 65 in
S' Lateral: 3 cm
Weight: 4105.6 [oz_av]

## 2024-01-31 MED ORDER — METOPROLOL SUCCINATE ER 50 MG PO TB24: 50.0000 mg | ORAL_TABLET | Freq: Every day | ORAL | 3 refills | Status: AC

## 2024-01-31 MED ORDER — METOPROLOL TARTRATE 100 MG PO TABS: 100.0000 mg | ORAL_TABLET | Freq: Once | ORAL | 0 refills | Status: DC

## 2024-01-31 NOTE — Assessment & Plan Note (Signed)
 History of statin intolerance. Recent lipid panel from 10/30/2023 total cholesterol 161, HDL 59, LDL 84, triglycerides 99. Currently on Repatha 140 mg subcutaneous injection once every 14 days.  Tolerating well. Continue the same.
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
## 2024-01-31 NOTE — Assessment & Plan Note (Addendum)
 EKG findings today sinus rhythm with narrow QRS heart rate in the 90s, interspersed with wide QRS regularly marching RR intervals with rates about 94 bpm appears consistent with accelerated idioventricular rhythm with a single QRS complex at the start of the tracing appeared to be a fusion complex.  She was asymptomatic. Cannot entirely exclude frequent ventricular ectopic beats.  She does have significant cardiovascular risk factors, also has risk factors for cardiac arrhythmias. No major structural cardiac abnormal disease on TEE June 2022, last ischemic workup with stress test nuclear imaging March 2022 no ischemia; coronary atherosclerosis with calcium score 31 on preablation cardiac CT in 09/2021.  Will obtain 7-day event monitor. Will obtain transthoracic echocardiogram to rule out any significant cardiac structural or functional abnormalities.  Will workup for any CAD with CT coronary angiogram. If any symptoms of dizziness, CP, shortness of breath she was recommended to go to ER.

## 2024-01-31 NOTE — Assessment & Plan Note (Signed)
 Unclear why she is currently not on valsartan that was prescribed by PCP recently. Blood pressures today near normal 136/84 mmHg on current dose of metoprolol succinate 37.5 mg once a day and amlodipine 5 mg once a day. We are titrating up metoprolol succinate to 50 mg once a day.

## 2024-01-31 NOTE — Assessment & Plan Note (Addendum)
 History of persistent atrial fibrillation. S/p ablation November 2022. Doing well. Had brief short run of A-fib lasting for couple hours in December while swimming in cold water with [place]. No further episodes.  On anticoagulation with Eliquis 5 mg twice daily. Continues to follow-up with Dr. Elberta Fortis.

## 2024-01-31 NOTE — Patient Instructions (Signed)
 Medication Instructions:  Your physician has recommended you make the following change in your medication:   START: Toprol XL 50 mg daily  *If you need a refill on your cardiac medications before your next appointment, please call your pharmacy*   Lab Work: Your physician recommends that you return for lab work in:   Labs today: CMP, Magnesium, CBC, TSH T3 T4  If you have labs (blood work) drawn today and your tests are completely normal, you will receive your results only by: MyChart Message (if you have MyChart) OR A paper copy in the mail If you have any lab test that is abnormal or we need to change your treatment, we will call you to review the results.   Testing/Procedures: Your physician has requested that you have an echocardiogram. Echocardiography is a painless test that uses sound waves to create images of your heart. It provides your doctor with information about the size and shape of your heart and how well your heart's chambers and valves are working. This procedure takes approximately one hour. There are no restrictions for this procedure. Please do NOT wear cologne, perfume, aftershave, or lotions (deodorant is allowed). Please arrive 15 minutes prior to your appointment time.  Please note: We ask at that you not bring children with you during ultrasound (echo/ vascular) testing. Due to room size and safety concerns, children are not allowed in the ultrasound rooms during exams. Our front office staff cannot provide observation of children in our lobby area while testing is being conducted. An adult accompanying a patient to their appointment will only be allowed in the ultrasound room at the discretion of the ultrasound technician under special circumstances. We apologize for any inconvenience.  A zio monitor was ordered today. It will remain on for 14 days. You will then return monitor and event diary in provided box. It takes 1-2 weeks for report to be downloaded and  returned to [place]. We will call you with the results. If monitor falls off or has orange flashing light, please call Zio for further instructions.     Your cardiac CT will be scheduled at one of the below locations:   [facility] [address] [place], [place] [address] [phone]  OR  [facility] [address] [place], [place] [address] [phone]  OR   [facility] [address] [place], [place] [address] [phone]  OR   [facility] [address] [place], [place] [address] [phone]  If scheduled at [facility], please arrive at the [facility] and Children's Entrance (Entrance C2) of [facility] 30 minutes prior to test start time. You can use the FREE valet parking offered at entrance C (encouraged to control the heart rate for the test)  Proceed to the [facility] Radiology Department (first floor) to check-in and test prep.  All radiology patients and guests should use entrance C2 at [facility], accessed from [facility], even though the hospital's physical address listed is [address].    If scheduled at [facility] or [facility], please arrive 15 mins early for check-in and test prep.  There is spacious parking and easy access to the radiology department from the [facility] entrance. Please enter here and check-in with the desk attendant.   If scheduled at [facility], please arrive 30 minutes early for check-in and test  prep.  Please follow these instructions carefully (unless otherwise directed):  An IV will be required for this test and Nitroglycerin will be given.  Hold all erectile dysfunction medications at least 3 days (72 hrs) prior to test. (Ie viagra, cialis, sildenafil, tadalafil, etc)   On the Night Before the Test: Be sure to  Drink plenty of water. Do not consume any caffeinated/decaffeinated beverages or chocolate 12 hours prior to your test. Do not take any antihistamines 12 hours prior to your test.  On the Day of the Test: Drink plenty of water until 1 hour prior to the test. Do not eat any food 1 hour prior to test. You may take your regular medications prior to the test.  Take metoprolol (Lopressor) two hours prior to test. Patients who wear a continuous glucose monitor MUST remove the device prior to scanning. FEMALES- please wear underwire-free bra if available, avoid dresses & tight clothing      After the Test: Drink plenty of water. After receiving IV contrast, you may experience a mild flushed feeling. This is normal. On occasion, you may experience a mild rash up to 24 hours after the test. This is not dangerous. If this occurs, you can take Benadryl 25 mg, Zyrtec, Claritin, or Allegra and increase your fluid intake. (Patients taking Tikosyn should avoid Benadryl, and may take Zyrtec, Claritin, or Allegra) If you experience trouble breathing, this can be serious. If it is severe call 911 IMMEDIATELY. If it is mild, please call our office.  We will call to schedule your test 2-4 weeks out understanding that some insurance companies will need an authorization prior to the service being performed.   For more information and frequently asked questions, please visit our website : [email or web address]  For non-scheduling related questions, please contact the cardiac imaging nurse navigator should you have any questions/concerns: Cardiac Imaging Nurse Navigators Direct Office Dial: [phone]   For scheduling needs, including cancellations and rescheduling, please call [place], [phone].    Follow-Up: At [facility], you and your health needs are our priority.  As part of our continuing mission to provide you with exceptional heart care, we have created designated  Provider Care Teams.  These Care Teams include your primary Cardiologist (physician) and Advanced Practice Providers (APPs -  Physician Assistants and Nurse Practitioners) who all work together to provide you with the care you need, when you need it.  We recommend signing up for the patient portal called "MyChart".  Sign up information is provided on this After Visit Summary.  MyChart is used to connect with patients for Virtual Visits (Telemedicine).  Patients are able to view lab/test results, encounter notes, upcoming appointments, etc.  Non-urgent messages can be sent to your provider as well.   To learn more about what you can do with MyChart, go to [email or web address].    Your next appointment:   1 month(s)  Provider:   Huntley Dec, MD    Other Instructions None

## 2024-02-01 LAB — CBC
Hematocrit: 43.1 % (ref 34.0–46.6)
Hemoglobin: 14.3 g/dL (ref 11.1–15.9)
MCH: 28.6 pg (ref 26.6–33.0)
MCHC: 33.2 g/dL (ref 31.5–35.7)
MCV: 86 fL (ref 79–97)
Platelets: 218 10*3/uL (ref 150–450)
RBC: 5 x10E6/uL (ref 3.77–5.28)
RDW: 12.5 % (ref 11.7–15.4)
WBC: 6.1 10*3/uL (ref 3.4–10.8)

## 2024-02-01 LAB — COMPREHENSIVE METABOLIC PANEL
ALT: 32 IU/L (ref 0–32)
AST: 22 IU/L (ref 0–40)
Albumin: 4.6 g/dL (ref 3.9–4.9)
Alkaline Phosphatase: 75 IU/L (ref 44–121)
BUN/Creatinine Ratio: 19 (ref 12–28)
BUN: 11 mg/dL (ref 8–27)
Bilirubin Total: 0.4 mg/dL (ref 0.0–1.2)
CO2: 26 mmol/L (ref 20–29)
Calcium: 9.5 mg/dL (ref 8.7–10.3)
Chloride: 104 mmol/L (ref 96–106)
Creatinine, Ser: 0.58 mg/dL (ref 0.57–1.00)
Globulin, Total: 2.4 g/dL (ref 1.5–4.5)
Glucose: 98 mg/dL (ref 70–99)
Potassium: 4.5 mmol/L (ref 3.5–5.2)
Sodium: 144 mmol/L (ref 134–144)
Total Protein: 7 g/dL (ref 6.0–8.5)
eGFR: 97 mL/min/{1.73_m2} (ref >59)

## 2024-02-01 LAB — MAGNESIUM: Magnesium: 2.1 mg/dL (ref 1.6–2.3)

## 2024-02-01 LAB — TSH+T4F+T3FREE
Free T4: 1.49 ng/dL (ref 0.82–1.77)
T3, Free: 3.3 pg/mL (ref 2.0–4.4)
TSH: 0.813 u[IU]/mL (ref 0.450–4.500)

## 2024-02-03 ENCOUNTER — Other Ambulatory Visit: Payer: Self-pay

## 2024-02-03 DIAGNOSIS — R928 Other abnormal and inconclusive findings on diagnostic imaging of breast: Secondary | ICD-10-CM

## 2024-02-05 ENCOUNTER — Encounter (HOSPITAL_COMMUNITY): Payer: Self-pay

## 2024-02-07 ENCOUNTER — Ambulatory Visit (HOSPITAL_COMMUNITY)
Admission: RE | Admit: 2024-02-07 | Discharge: 2024-02-07 | Disposition: A | Discharge status: Home / Self Care | Source: Ambulatory Visit

## 2024-02-07 DIAGNOSIS — I10 Essential (primary) hypertension: Secondary | ICD-10-CM

## 2024-02-07 DIAGNOSIS — R9431 Abnormal electrocardiogram [ECG] [EKG]: Secondary | ICD-10-CM | POA: Diagnosis not present

## 2024-02-07 MED ORDER — NITROGLYCERIN 0.4 MG SL SUBL
SUBLINGUAL_TABLET | SUBLINGUAL | Status: AC
  Filled 2024-02-07: qty 2

## 2024-02-07 MED ORDER — NITROGLYCERIN 0.4 MG SL SUBL
0.8000 mg | SUBLINGUAL_TABLET | Freq: Once | SUBLINGUAL | Status: AC
  Administered 2024-02-07: 0.8 mg via SUBLINGUAL

## 2024-02-07 MED ORDER — IOHEXOL 350 MG/ML SOLN
95.0000 mL | Freq: Once | INTRAVENOUS | Status: AC | PRN
  Administered 2024-02-07: 95 mL via INTRAVENOUS

## 2024-02-07 NOTE — Progress Notes (Signed)
 Patient tolerated CT well. Vital signs stable encourage to drink water throughout day.Reasons explained and verbalized understanding. Ambulated steady gait.

## 2024-02-09 DIAGNOSIS — I2510 Atherosclerotic heart disease of native coronary artery without angina pectoris: Secondary | ICD-10-CM | POA: Insufficient documentation

## 2024-02-10 ENCOUNTER — Other Ambulatory Visit: Payer: Self-pay | Admitting: Family Medicine

## 2024-02-10 DIAGNOSIS — R928 Other abnormal and inconclusive findings on diagnostic imaging of breast: Secondary | ICD-10-CM

## 2024-02-17 ENCOUNTER — Ambulatory Visit
Admission: RE | Admit: 2024-02-17 | Discharge: 2024-02-17 | Disposition: A | Discharge status: Home / Self Care | Source: Ambulatory Visit | Attending: Family Medicine | Admitting: Family Medicine

## 2024-02-17 ENCOUNTER — Encounter: Payer: Self-pay | Admitting: Family Medicine

## 2024-02-17 DIAGNOSIS — R928 Other abnormal and inconclusive findings on diagnostic imaging of breast: Secondary | ICD-10-CM

## 2024-02-17 DIAGNOSIS — N6320 Unspecified lump in the left breast, unspecified quadrant: Secondary | ICD-10-CM | POA: Diagnosis not present

## 2024-02-25 DIAGNOSIS — R9431 Abnormal electrocardiogram [ECG] [EKG]: Secondary | ICD-10-CM | POA: Diagnosis not present

## 2024-02-28 ENCOUNTER — Other Ambulatory Visit: Payer: Self-pay

## 2024-02-28 DIAGNOSIS — I4891 Unspecified atrial fibrillation: Secondary | ICD-10-CM | POA: Insufficient documentation

## 2024-02-28 DIAGNOSIS — E7800 Pure hypercholesterolemia, unspecified: Secondary | ICD-10-CM | POA: Insufficient documentation

## 2024-02-28 DIAGNOSIS — I10 Essential (primary) hypertension: Secondary | ICD-10-CM | POA: Insufficient documentation

## 2024-03-03 ENCOUNTER — Ambulatory Visit

## 2024-03-03 VITALS — BP 138/68 | HR 88 | Ht 65.0 in | Wt 258.8 lb

## 2024-03-03 DIAGNOSIS — I2510 Atherosclerotic heart disease of native coronary artery without angina pectoris: Secondary | ICD-10-CM

## 2024-03-03 DIAGNOSIS — I4819 Other persistent atrial fibrillation: Secondary | ICD-10-CM | POA: Diagnosis not present

## 2024-03-03 DIAGNOSIS — G4733 Obstructive sleep apnea (adult) (pediatric): Secondary | ICD-10-CM | POA: Diagnosis not present

## 2024-03-03 NOTE — Patient Instructions (Signed)
 Medication Instructions:  Your physician recommends that you continue on your current medications as directed. Please refer to the Current Medication list given to you today.  *If you need a refill on your cardiac medications before your next appointment, please call your pharmacy*   Lab Work: None Ordered If you have labs (blood work) drawn today and your tests are completely normal, you will receive your results only by: MyChart Message (if you have MyChart) OR A paper copy in the mail If you have any lab test that is abnormal or we need to change your treatment, we will call you to review the results.   Testing/Procedures: None Ordered   Follow-Up: At [facility], you and your health needs are our priority.  As part of our continuing mission to provide you with exceptional heart care, we have created designated Provider Care Teams.  These Care Teams include your primary Cardiologist (physician) and Advanced Practice Providers (APPs -  Physician Assistants and Nurse Practitioners) who all work together to provide you with the care you need, when you need it.  We recommend signing up for the patient portal called "MyChart".  Sign up information is provided on this After Visit Summary.  MyChart is used to connect with patients for Virtual Visits (Telemedicine).  Patients are able to view lab/test results, encounter notes, upcoming appointments, etc.  Non-urgent messages can be sent to your provider as well.   To learn more about what you can do with MyChart, go to [email or web address].    Your next appointment:   Follow up

## 2024-03-03 NOTE — Assessment & Plan Note (Signed)
 S/p ablation November, 2022. 42% A-fib burden on heart monitor. Proceed with EP follow-up with Dr. Elberta Fortis to review A-fib burden and abnormal findings on heart monitor with wide QRS morphology consistent with intermittent bundle branch block while in A-fib with RVR. She is relatively asymptomatic and does have elevated heart rates. Will schedule follow-up with EP to discuss further long-term management for A-fib rhythm control with redo ablation versus antiarrhythmic.

## 2024-03-03 NOTE — Progress Notes (Signed)
 Cardiology Consultation:    Date:  03/03/2024   ID:  Teresa Blair, DOB [birth year], MRN [id number]  PCP:  Teresa Ohara, MD  Cardiologist:  Teresa Corporal Irasema Chalk, MD   Referring MD: Teresa Ohara, MD   No chief complaint on file.    ASSESSMENT AND PLAN:   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, hypertension, prior ischemic workup with stress test nuclear imaging March 2022 without ischemia, obstructive sleep apnea-uses CPAP, obesity, hyperlipidemia, statin intolerance was noted to have nonspecific abnormal EKG findings cardiac CT imaging April 2025 with mild nonobstructive disease, echocardiogram with normal biventricular function and grade 1 diastolic dysfunction and no major valve abnormalities and heart monitor over 14 days shows 42% atrial fibrillation burden with average heart rate 89 and at times tachycardia up to 183 bpm in A-fib, relatively asymptomatic but does have 2.7% ventricular ectopy burden and intraventricular conduction delay and A-fib.  Problem List Items Addressed This Visit     Atrial fibrillation [facility]) - Primary   S/p ablation November, 2022. 42% A-fib burden on heart monitor. Proceed with EP follow-up with Dr. Elberta Fortis to review A-fib burden and abnormal findings on heart monitor with wide QRS morphology consistent with intermittent bundle branch block while in A-fib with RVR. She is relatively asymptomatic and does have elevated heart rates. Will schedule follow-up with EP to discuss further long-term management for A-fib rhythm control with redo ablation versus antiarrhythmic.      Coronary atherosclerosis, cardiac CT March 2025, CAD RADS 2 study, calcium score 52.5 and total plaque volume 86 mm  cube   Reviewed findings on the cardiac CT. Reassured. No indication for aspirin at this time as she continues on Eliquis. Continue Repatha for lipid lowering. Statin intolerant.        Return to clinic for follow-up with me tentatively in 1 year or as needed.  History of Present Illness:    Teresa Blair is a 73 y.o. female who is being seen today for follow-up visit. PCP is Cox, Kirsten, MD. Last visit with me in the office was 01-31-2024.  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  Further evaluated with transthoracic echocardiogram January 31, 2024 that noted normal biventricular function LVEF 60 to 65%, grade 1 diastolic dysfunction, no significant valve abnormalities.  Cardiac CT imaging was similarly reassuring with calcium score 52.5, total plaque volume 86 mm cube, CAD RADS 2 study with mild nonobstructive disease predominantly in proximal LAD.  Distal LAD and RCA are poorly visualized.  Radiologist over read of the extracardiac findings noted stable likely benign subcentimeter right middle lobe and left lower lobe pulmonary nodules.  Heart monitor for 14 days down from 01-31-2024 noted predominantly sinus rhythm with average heart rate 77/min [ranging from 43 bpm to 112 bpm].  Atrial fibrillation was noted up to 42% of the time with average heart rate  89/min [ranging from 44 bpm to 183 bpm]. Patient triggered episodes with diary entries noted 4 times correlated with atrial fibrillation and at times with  ventricular ectopy in a bigeminy fashion.  Overall ventricular ectopy burden 2.7%.  Tachycardia was noted with atrial fibrillation at times   Relatively asymptomatic.  Reviewed the burden of A-fib on the heart monitor. Denies any chest pain or shortness of breath lightheadedness or syncopal episodes.   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) 05/07/2023   History of CVA (cerebrovascular accident) 09/16/2020   Hypercholesterolemia    Hypertension    Mixed hyperlipidemia 09/16/2020   Morbid obesity (HCC) 09/16/2020   Nonspecific abnormal electrocardiogram (ECG) (EKG) 01/31/2024   OSA (obstructive sleep apnea) 03/31/2021   Optimal PAP 14     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   Primary hypertension 09/16/2020   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   Right hip pain 09/03/2011   Scleroderma, localized    Sequelae, 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   Stroke (HCC) 09/12/2020   left side weakness/speech affected   Wears glasses     Past Surgical History:  Procedure Laterality Date   ATRIAL FIBRILLATION ABLATION N/A 10/17/2021   Procedure: ATRIAL FIBRILLATION ABLATION;  Surgeon: Regan Lemming, MD;  Location: [facility] INVASIVE CV LAB;  Service: Cardiovascular;  Laterality: N/A;   BUBBLE STUDY  05/04/2021   Procedure: BUBBLE STUDY;  Surgeon: Parke Poisson, MD;  Location: [facility] ENDOSCOPY;  Service: Cardiovascular;;   CARDIOVERSION N/A 05/04/2021   Procedure: CARDIOVERSION;   Surgeon: Parke Poisson, MD;  Location: [facility] ENDOSCOPY;  Service: Cardiovascular;  Laterality: N/A;   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 06/08/2015   Procedure: CONVERSION TO TOTAL HIP, ANTERIOR TOTAL HIP;  Surgeon: Mckinley Jewel, MD;  Location: [facility] OR;  Service: Orthopedics;  Laterality: Right;   FRACTURE SURGERY     right hip   TEE WITHOUT CARDIOVERSION N/A 05/04/2021   Procedure: TRANSESOPHAGEAL ECHOCARDIOGRAM (TEE);  Surgeon: Parke Poisson, MD;  [facility];  Service: Cardiovascular;  Laterality: N/A;   TOTAL HIP ARTHROPLASTY Right 06/08/2015   TUBAL LIGATION     WISDOM TOOTH EXTRACTION      Current Medications: Current Meds  Medication Sig   amLODipine (NORVASC) 5 MG tablet Take 5 mg by mouth daily.   diclofenac Sodium (VOLTAREN) 1 % GEL Apply 1 application  topically daily as needed (pain).   ELIQUIS 5 MG TABS tablet TAKE 1 TABLET(5 MG) BY MOUTH TWICE DAILY   metoprolol succinate (TOPROL-XL) 50 MG 24 hr tablet Take 1 tablet (50 mg total) by mouth daily. Take with or immediately following a meal.   REPATHA SURECLICK 140 MG/ML SOAJ ADMINISTER 1 ML UNDER THE SKIN EVERY 14 DAYS     Allergies:   Perflutren, Atorvastatin, Other, Pravastatin, Sulfa antibiotics, and Sulfamethoxazole   Social History   Socioeconomic History   Marital status: Divorced    Spouse name: Not on file   Number of children: 3   Years of  education: Not on file   Highest education level: Not on file  Occupational History    Comment: full time  Tobacco Use   Smoking status: Never   Smokeless tobacco: Never  Vaping Use   Vaping status: Never Used  Substance and Sexual Activity   Alcohol use: No   Drug use: No   Sexual activity: Not Currently  Other Topics Concern   Not on file  Social History Narrative   Lives alone   Right Handed   Drinks no caffeine   Social Drivers of Health   Financial Resource Strain: Low Risk  (10/02/2023)   Overall Financial Resource Strain (CARDIA)    Difficulty  of Paying Living Expenses: Not hard at all  Food Insecurity: No Food Insecurity (10/02/2023)   Hunger Vital Sign    Worried About Running Out of Food in the Last Year: Never true    Ran Out of Food in the Last Year: Never true  Transportation Needs: No Transportation Needs (10/02/2023)   PRAPARE - Administrator, Civil Service (Medical): No    Lack of Transportation (Non-Medical): No  Physical Activity: Insufficiently Active (10/02/2023)   Exercise Vital Sign    Days of Exercise per Week: 7 days    Minutes of Exercise per Session: 10 min  Stress: Stress Concern Present (10/02/2023)   [employer] of Occupational Health - Occupational Stress Questionnaire    Feeling of Stress : Rather much  Social Connections: Moderately Integrated (10/02/2023)   Social Connection and Isolation Panel [NHANES]    Frequency of Communication with Friends and Family: More than three times a week    Frequency of Social Gatherings with Friends and Family: More than three times a week    Attends Religious Services: More than 4 times per year    Active Member of [employer] or Organizations: Yes    Attends Engineer, structural: More than 4 times per year    Marital Status: Divorced     Family History: The patient's family history includes Cancer in her father and mother; Hypertension in an other family member. ROS:   Please see the history of present illness.    All 14 point review of systems negative except as described per history of present illness.  EKGs/Labs/Other Studies Reviewed:    The following studies were reviewed today:   EKG:       Recent Labs: 01/31/2024: ALT 32; BUN 11; Creatinine, Ser 0.58; Hemoglobin 14.3; Magnesium 2.1; Platelets 218; Potassium 4.5; Sodium 144; TSH 0.813  Recent Lipid Panel    Component Value Date/Time   CHOL 161 10/30/2023 0804   TRIG 99 10/30/2023 0804   HDL 59 10/30/2023 0804   CHOLHDL 2.7 10/30/2023 0804   LDLCALC 84 10/30/2023 0804     Physical Exam:    VS:  BP 138/68   Pulse 88   Ht 5\' 5"  (1.651 m)   Wt 258 lb 12.8 oz (117.4 kg)   LMP 02/25/2015 (Approximate)   SpO2 96%   BMI 43.07 kg/m     Wt Readings from Last 3 Encounters:  03/03/24 258 lb 12.8 oz (117.4 kg)  01/31/24 256 lb 9.6 oz (116.4 kg)  12/02/23 246 lb 9.6 oz (111.9 kg)     GENERAL:  Well nourished, well developed in no acute distress NECK: No JVD; No carotid bruits CARDIAC: RRR, S1 and S2 present, no murmurs, no rubs, no gallops  Extremities: No pitting pedal edema. Pulses bilaterally symmetric with radial 2+ and  dorsalis pedis 2+ NEUROLOGIC:  Alert and oriented x 3  Medication Adjustments/Labs and Tests Ordered: Current medicines are reviewed at length with the patient today.  Concerns regarding medicines are outlined above.  No orders of the defined types were placed in this encounter.  No orders of the defined types were placed in this encounter.   Signed, Cecille Amsterdam, MD, MPH, [facility]. 03/03/2024 4:24 PM    [facility]

## 2024-03-03 NOTE — Assessment & Plan Note (Signed)
 Reviewed findings on the cardiac CT. Reassured. No indication for aspirin at this time as she continues on Eliquis. Continue Repatha for lipid lowering. Statin intolerant.

## 2024-03-04 DIAGNOSIS — R9431 Abnormal electrocardiogram [ECG] [EKG]: Secondary | ICD-10-CM | POA: Diagnosis not present

## 2024-03-16 ENCOUNTER — Ambulatory Visit: Attending: Cardiology | Admitting: Cardiology

## 2024-03-16 ENCOUNTER — Encounter: Payer: Self-pay | Admitting: Cardiology

## 2024-03-16 VITALS — BP 140/72 | HR 89 | Ht 65.0 in | Wt 260.0 lb

## 2024-03-16 DIAGNOSIS — Z01812 Encounter for preprocedural laboratory examination: Secondary | ICD-10-CM | POA: Diagnosis not present

## 2024-03-16 DIAGNOSIS — I10 Essential (primary) hypertension: Secondary | ICD-10-CM | POA: Diagnosis not present

## 2024-03-16 DIAGNOSIS — I4819 Other persistent atrial fibrillation: Secondary | ICD-10-CM

## 2024-03-16 DIAGNOSIS — G4733 Obstructive sleep apnea (adult) (pediatric): Secondary | ICD-10-CM | POA: Diagnosis not present

## 2024-03-16 DIAGNOSIS — D6869 Other thrombophilia: Secondary | ICD-10-CM | POA: Diagnosis not present

## 2024-03-16 NOTE — Addendum Note (Signed)
 Addended by: Alvenia Aus on: 03/16/2024 03:58 PM   Modules accepted: Orders

## 2024-03-16 NOTE — Patient Instructions (Signed)
 Medication Instructions:  Your physician recommends that you continue on your current medications as directed. Please refer to the Current Medication list given to you today.  *If you need a refill on your cardiac medications before your next appointment, please call your pharmacy*   Lab Work: Pre procedure labs -- we will call you to schedule:  BMP & CBC  If you have a lab test that is abnormal and we need to change your treatment, we will call you to review the results -- otherwise no news is good news.    Testing/Procedures: Your physician has requested that you have cardiac CT 1 month PRIOR to your ablation. Cardiac computed tomography (CT) is a painless test that uses an x-ray machine to take clear, detailed pictures of your heart. We will contact you if the result is abnormal. We will call you to schedule.  Your physician has recommended that you have an ablation. Catheter ablation is a medical procedure used to treat some cardiac arrhythmias (irregular heartbeats). During catheter ablation, a long, thin, flexible tube is put into a blood vessel in your groin (upper thigh), or neck. This tube is called an ablation catheter. It is then guided to your heart through the blood vessel. Radio frequency waves destroy small areas of heart tissue where abnormal heartbeats may cause an arrhythmia to start.   Your ablation is scheduled for 06/23/2024. Please arrive at [facility] at 5:30 am.  We will call/send instructions at a later date.   Follow-Up: At [facility], you and your health needs are our priority.  As part of our continuing mission to provide you with exceptional heart care, we have created designated Provider Care Teams.  These Care Teams include your primary Cardiologist (physician) and Advanced Practice Providers (APPs -  Physician Assistants and Nurse Practitioners) who all work together to provide you with the care you need, when you need it.  Your next appointment:    1 month(s) after your ablation  The format for your next appointment:   In Person  Provider:   AFib clinic   Thank you for choosing [facility]!!   Reece Cane, RN [phone]    Other Instructions   Cardiac Ablation Cardiac ablation is a procedure to destroy (ablate) some heart tissue that is sending bad signals. These bad signals cause problems in heart rhythm. The heart has many areas that make these signals. If there are problems in these areas, they can make the heart beat in a way that is not normal. Destroying some tissues can help make the heart rhythm normal. Tell your doctor about: Any allergies you have. All medicines you are taking. These include vitamins, herbs, eye drops, creams, and over-the-counter medicines. Any problems you or family members have had with medicines that make you fall asleep (anesthetics). Any blood disorders you have. Any surgeries you have had. Any medical conditions you have, such as kidney failure. Whether you are pregnant or may be pregnant. What are the risks? This is a safe procedure. But problems may occur, including: Infection. Bruising and bleeding. Bleeding into the chest. Stroke or blood clots. Damage to nearby areas of your body. Allergies to medicines or dyes. The need for a pacemaker if the normal system is damaged. Failure of the procedure to treat the problem. What happens before the procedure? Medicines Ask your doctor about: Changing or stopping your normal medicines. This is important. Taking aspirin and ibuprofen. Do not take these medicines unless your doctor tells you  to take them. Taking other medicines, vitamins, herbs, and supplements. General instructions Follow instructions from your doctor about what you cannot eat or drink. Plan to have someone take you home from the hospital or clinic. If you will be going home right after the procedure, plan to have someone with you for 24 hours. Ask your  doctor what steps will be taken to prevent infection. What happens during the procedure?  An IV tube will be put into one of your veins. You will be given a medicine to help you relax. The skin on your neck or groin will be numbed. A cut (incision) will be made in your neck or groin. A needle will be put through your cut and into a large vein. A tube (catheter) will be put into the needle. The tube will be moved to your heart. Dye may be put through the tube. This helps your doctor see your heart. Small devices (electrodes) on the tube will send out signals. A type of energy will be used to destroy some heart tissue. The tube will be taken out. Pressure will be held on your cut. This helps stop bleeding. A bandage will be put over your cut. The exact procedure may vary among doctors and hospitals. What happens after the procedure? You will be watched until you leave the hospital or clinic. This includes checking your heart rate, breathing rate, oxygen, and blood pressure. Your cut will be watched for bleeding. You will need to lie still for a few hours. Do not drive for 24 hours or as long as your doctor tells you. Summary Cardiac ablation is a procedure to destroy some heart tissue. This is done to treat heart rhythm problems. Tell your doctor about any medical conditions you may have. Tell him or her about all medicines you are taking to treat them. This is a safe procedure. But problems may occur. These include infection, bruising, bleeding, and damage to nearby areas of your body. Follow what your doctor tells you about food and drink. You may also be told to change or stop some of your medicines. After the procedure, do not drive for 24 hours or as long as your doctor tells you. This information is not intended to replace advice given to you by your health care provider. Make sure you discuss any questions you have with your health care provider. Document Revised: 02/02/2022 Document  Reviewed: 10/15/2019 Elsevier Patient Education  2023 Elsevier Inc.   Cardiac Ablation, Care After  This sheet gives you information about how to care for yourself after your procedure. Your health care provider may also give you more specific instructions. If you have problems or questions, contact your health care provider. What can I expect after the procedure? After the procedure, it is common to have: Bruising around your puncture site. Tenderness around your puncture site. Skipped heartbeats. If you had an atrial fibrillation ablation, you may have atrial fibrillation during the first several months after your procedure.  Tiredness (fatigue).  Follow these instructions at home: Puncture site care  Follow instructions from your health care provider about how to take care of your puncture site. Make sure you: If present, leave stitches (sutures), skin glue, or adhesive strips in place. These skin closures may need to stay in place for up to 2 weeks. If adhesive strip edges start to loosen and curl up, you may trim the loose edges. Do not remove adhesive strips completely unless your health care provider tells you to do  that. If a large square bandage is present, this may be removed 24 hours after surgery.  Check your puncture site every day for signs of infection. Check for: Redness, swelling, or pain. Fluid or blood. If your puncture site starts to bleed, lie down on your back, apply firm pressure to the area, and contact your health care provider. Warmth. Pus or a bad smell. A pea or small marble sized lump at the site is normal and can take up to three months to resolve.  Driving Do not drive for at least 4 days after your procedure or however long your health care provider recommends. (Do not resume driving if you have previously been instructed not to drive for other health reasons.) Do not drive or use heavy machinery while taking prescription pain medicine. Activity Avoid  activities that take a lot of effort for at least 7 days after your procedure. Do not lift anything that is heavier than 5 lb (4.5 kg) for one week.  No sexual activity for 1 week.  Return to your normal activities as told by your health care provider. Ask your health care provider what activities are safe for you. General instructions Take over-the-counter and prescription medicines only as told by your health care provider. Do not use any products that contain nicotine or tobacco, such as cigarettes and e-cigarettes. If you need help quitting, ask your health care provider. You may shower after 24 hours, but Do not take baths, swim, or use a hot tub for 1 week.  Do not drink alcohol for 24 hours after your procedure. Keep all follow-up visits as told by your health care provider. This is important. Contact a health care provider if: You have redness, mild swelling, or pain around your puncture site. You have fluid or blood coming from your puncture site that stops after applying firm pressure to the area. Your puncture site feels warm to the touch. You have pus or a bad smell coming from your puncture site. You have a fever. You have chest pain or discomfort that spreads to your neck, jaw, or arm. You have chest pain that is worse with lying on your back or taking a deep breath. You are sweating a lot. You feel nauseous. You have a fast or irregular heartbeat. You have shortness of breath. You are dizzy or light-headed and feel the need to lie down. You have pain or numbness in the arm or leg closest to your puncture site. Get help right away if: Your puncture site suddenly swells. Your puncture site is bleeding and the bleeding does not stop after applying firm pressure to the area. These symptoms may represent a serious problem that is an emergency. Do not wait to see if the symptoms will go away. Get medical help right away. Call your local emergency services (911 in the [place] Do not  drive yourself to the hospital. Summary After the procedure, it is normal to have bruising and tenderness at the puncture site in your groin, neck, or forearm. Check your puncture site every day for signs of infection. Get help right away if your puncture site is bleeding and the bleeding does not stop after applying firm pressure to the area. This is a medical emergency. This information is not intended to replace advice given to you by your health care provider. Make sure you discuss any questions you have with your health care provider.

## 2024-03-16 NOTE — Progress Notes (Signed)
 Electrophysiology Office Note:   Date:  03/16/2024  ID:  Teresa Blair, Teresa Blair 1951/03/23, MRN [id number]  Primary Cardiologist: Daymon Evans Madireddy, MD Primary Heart Failure: None Electrophysiologist: Lesleyanne Politte Cortland Ding, MD      History of Present Illness:   Teresa Blair is a 73 y.o. female with h/o atrial fibrillation, CVA, hypertension, obesity, sleep apnea seen today for routine electrophysiology followup.   Post atrial fibrillation ablation 10/17/2021.  She had COVID-19 infection and had episodes of atrial fibrillation during that infection.  She did well until most recently when she wore a cardiac monitor that showed a 42% atrial fibrillation burden.  Since last being seen in our clinic the patient reports doing well.  She has potentially some fatigue and exercise intolerance, but otherwise has no major complaint..  she denies chest pain, palpitations, dyspnea, PND, orthopnea, nausea, vomiting, dizziness, syncope, edema, weight gain, or early satiety.   Review of systems complete and found to be negative unless listed in HPI.   EP Information / Studies Reviewed:    EKG is ordered today. Personal review as below.  EKG Interpretation Date/Time:  Monday March 16 2024 15:11:38 EDT Ventricular Rate:  89 PR Interval:  218 QRS Duration:  90 QT Interval:  378 QTC Calculation: 459 R Axis:   48  Text Interpretation: Sinus rhythm with 1st degree A-V block Otherwise normal ECG When compared with ECG of 31-Jan-2024 07:58, Premature ventricular complexes are no longer Present Confirmed by Kailie Polus ([id number]) on 03/16/2024 3:26:17 PM     Risk Assessment/Calculations:    CHA2DS2-VASc Score = 5   This indicates a 7.2% annual risk of stroke. The patient's score is based upon: CHF History: 0 HTN History: 1 Diabetes History: 0 Stroke History: 2 Vascular Disease History: 0 Age Score: 1 Gender Score: 1            Physical Exam:   VS:  BP (!) 140/72   Pulse 89   Ht 5\' 5"   (1.651 m)   Wt 260 lb (117.9 kg)   LMP 02/25/2015 (Approximate)   SpO2 98%   BMI 43.27 kg/m    Wt Readings from Last 3 Encounters:  03/16/24 260 lb (117.9 kg)  03/03/24 258 lb 12.8 oz (117.4 kg)  01/31/24 256 lb 9.6 oz (116.4 kg)     GEN: Well nourished, well developed in no acute distress NECK: No JVD; No carotid bruits CARDIAC: Regular rate and rhythm, no murmurs, rubs, gallops RESPIRATORY:  Clear to auscultation without rales, wheezing or rhonchi  ABDOMEN: Soft, non-tender, non-distended EXTREMITIES:  No edema; No deformity   ASSESSMENT AND PLAN:    1.  Persistent atrial fibrillation: Post ablation 10/17/2021.  Cardiac monitor with a 42% burden, personally reviewed.  She would prefer a rhythm control strategy for her atrial fibrillation and would like to avoid long-term antiarrhythmics.  Due to that, we Saloma Cadena plan for ablation.  Risk, benefits, and alternatives to EP study and radiofrequency/pulse field ablation for afib were also discussed in detail today. These risks include but are not limited to stroke, bleeding, vascular damage, tamponade, perforation, damage to the esophagus, lungs, and other structures, pulmonary vein stenosis, worsening renal function, and death. The patient understands these risk and wishes to proceed.  We Xavia Kniskern therefore proceed with catheter ablation at the next available time.  Carto, ICE, anesthesia are requested for the procedure.  Jacqulyn Barresi also obtain CT PV protocol prior to the procedure to exclude LAA thrombus and further evaluate atrial anatomy.  2.  Obstructive sleep apnea: CPAP compliance encouraged  3.  Hypertension: Mildly elevated today but usually well-controlled  4.  Secondary hypercoagulable state: On Eliquis  for atrial fibrillation  5.  Hyperlipidemia: Continue Repatha   Follow up with Afib Clinic as usual post procedure  Signed, Ilhan Madan Cortland Ding, MD

## 2024-03-24 DIAGNOSIS — L821 Other seborrheic keratosis: Secondary | ICD-10-CM | POA: Diagnosis not present

## 2024-03-24 DIAGNOSIS — D225 Melanocytic nevi of trunk: Secondary | ICD-10-CM | POA: Diagnosis not present

## 2024-03-24 DIAGNOSIS — Z85820 Personal history of malignant melanoma of skin: Secondary | ICD-10-CM | POA: Diagnosis not present

## 2024-03-24 DIAGNOSIS — D2239 Melanocytic nevi of other parts of face: Secondary | ICD-10-CM | POA: Diagnosis not present

## 2024-03-27 ENCOUNTER — Other Ambulatory Visit: Payer: Self-pay | Admitting: Family Medicine

## 2024-04-07 ENCOUNTER — Other Ambulatory Visit: Payer: Self-pay | Admitting: Cardiology

## 2024-04-10 ENCOUNTER — Other Ambulatory Visit: Payer: Self-pay

## 2024-04-25 DIAGNOSIS — G4733 Obstructive sleep apnea (adult) (pediatric): Secondary | ICD-10-CM | POA: Diagnosis not present

## 2024-04-28 NOTE — Progress Notes (Signed)
 Subjective:  Patient ID: Teresa Blair, female    DOB: 1951/08/12  Age: 73 y.o. MRN: [id number]  Chief Complaint  Patient presents with   Medical Management of Chronic Issues    HPI: The patient, with a history of stroke and sleep apnea and use of a CPAP machine. CPAP doing well.   Patient is working on maintaining diet and exercise regimen and follows up as directed.   Patient presents with hyperlipidemia and aortic atherosclerosis.  Aortic atherosclerosis found in 2022. History of stroke.   Patient is using repatha  without problems.   Patient has a diagnosis of chronic atrial fibrillation. Patient is on toprol  xl 50 mg one tab in am and eliquis  5 mg twice daily and has controlled ventricular response. Patient has a small PFO with Left to Right shunting. Patient sees Dr. Lawana Pray. Patient is going to have an ablation.   Patient has history of OA and fracture of right hip. Patient has a history of a stroke that affected the left side. She has some left sided weakness. Her gait is abnormal, but she compensates well.   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 Has a fatty liver found on ct in 2022.      04/29/2024    7:28 AM 10/30/2023    7:33 AM 10/02/2023    2:43 PM 04/30/2023    7:41 AM 08/23/2021    8:10 AM  Depression screen PHQ 2/9  Decreased Interest 0 0 0 0 0  Down, Depressed, Hopeless 0 0 0 0 0  PHQ - 2 Score 0 0 0 0 0  Altered sleeping 0 0 0 1   Tired, decreased energy 0 0 0 0   Change in appetite 0 0 0 1   Feeling bad or failure about yourself  0 0 0 0   Trouble concentrating 0 0 0 0   Moving slowly or fidgety/restless 0 0 0 0   Suicidal thoughts 0 0 0 0   PHQ-9 Score 0 0 0 2   Difficult doing work/chores Not difficult at all Not difficult at all Not difficult at all Not difficult at all         04/29/2024    7:28 AM  Fall Risk   Falls in the past year? 1  Number falls in past yr: 0  Injury with Fall? 0  Risk for fall due to : History  of fall(s)    Patient Care Team: Mercy Stall, MD as PCP - General (Family Medicine) Lei Pump, MD as PCP - Electrophysiology (Cardiology) Madireddy, Daymon Evans, MD as PCP - Cardiology (Cardiology)   Review of Systems  Constitutional:  Positive for fever. Negative for chills and fatigue.  HENT:  Positive for congestion and voice change. Negative for ear pain, sinus pressure and sore throat.   Respiratory:  Positive for cough. Negative for shortness of breath.   Cardiovascular:  Negative for chest pain.  Gastrointestinal:  Negative for abdominal pain, constipation, diarrhea, nausea and vomiting.  Genitourinary:  Negative for dysuria and frequency.  Musculoskeletal:  Positive for arthralgias. Negative for back pain and myalgias.  Neurological:  Negative for dizziness and headaches.  Psychiatric/Behavioral:  Negative for dysphoric mood. The patient is not nervous/anxious.     Current Outpatient Medications on File Prior to Visit  Medication Sig Dispense Refill   amLODipine  (NORVASC ) 5 MG tablet TAKE 1 TABLET(5 MG) BY MOUTH DAILY 90 tablet 0   diclofenac Sodium (VOLTAREN)  1 % GEL Apply 1 application  topically daily as needed (pain).     ELIQUIS  5 MG TABS tablet TAKE 1 TABLET(5 MG) BY MOUTH TWICE DAILY 60 tablet 5   metoprolol  succinate (TOPROL -XL) 50 MG 24 hr tablet Take 1 tablet (50 mg total) by mouth daily. Take with or immediately following a meal. 90 tablet 3   REPATHA  SURECLICK 140 MG/ML SOAJ ADMINISTER 1 ML UNDER THE SKIN EVERY 14 DAYS 2 mL 11   No current facility-administered medications on file prior to visit.   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 (HCC) 05/07/2023   History of CVA (cerebrovascular accident) 09/16/2020   Hypercholesterolemia    Hypertension    Mixed hyperlipidemia 09/16/2020   Morbid obesity (HCC) 09/16/2020   Nonspecific abnormal electrocardiogram (ECG) (EKG) 01/31/2024   OSA (obstructive sleep apnea) 03/31/2021   Optimal PAP 14     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   Primary hypertension 09/16/2020   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   Right hip pain 09/03/2011   Scleroderma, localized    Sequelae, 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   Stroke (HCC) 09/12/2020   left side weakness/speech affected   Wears glasses    Past Surgical History:  Procedure Laterality Date   ATRIAL FIBRILLATION ABLATION N/A 10/17/2021   Procedure: ATRIAL FIBRILLATION ABLATION;  Surgeon: Lei Pump, MD;  Location: [facility] INVASIVE CV LAB;  Service: Cardiovascular;  Laterality: N/A;   BUBBLE STUDY  05/04/2021   Procedure: BUBBLE STUDY;  Surgeon: Euell Herrlich, MD;  Location: [facility] ENDOSCOPY;  Service: Cardiovascular;;   CARDIOVERSION N/A 05/04/2021   Procedure: CARDIOVERSION;  Surgeon: Euell Herrlich, MD;  Location: [facility] ENDOSCOPY;  Service: Cardiovascular;  Laterality: N/A;   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 06/08/2015   Procedure: CONVERSION TO TOTAL HIP, ANTERIOR TOTAL HIP;  Surgeon: Sandie Cross, MD;  Location: [facility] OR;  Service: Orthopedics;  Laterality: Right;   FRACTURE SURGERY     right hip   TEE WITHOUT CARDIOVERSION N/A 05/04/2021   Procedure: TRANSESOPHAGEAL ECHOCARDIOGRAM (TEE);  Surgeon: Euell Herrlich, MD;  Location: [facility] ENDOSCOPY;  Service: Cardiovascular;  Laterality: N/A;   TOTAL HIP ARTHROPLASTY Right 06/08/2015   TUBAL LIGATION     WISDOM TOOTH EXTRACTION      Family History  Problem Relation Age of Onset   Cancer  Mother    Cancer Father    Hypertension Other    Social History   Socioeconomic History   Marital status: Divorced    Spouse name: Not on file   Number of children: 3   Years of education: Not on file   Highest education level: Not on file  Occupational History    Comment: full time  Tobacco Use   Smoking status: Never   Smokeless tobacco: Never  Vaping Use   Vaping status: Never Used  Substance and Sexual Activity   Alcohol use: No   Drug use: No   Sexual activity: Not Currently  Other Topics Concern  Not on file  Social History Narrative   Lives alone   Right Handed   Drinks no caffeine   Social Drivers of Health   Financial Resource Strain: Low Risk  (10/02/2023)   Overall Financial Resource Strain (CARDIA)    Difficulty of Paying Living Expenses: Not hard at all  Food Insecurity: No Food Insecurity (10/02/2023)   Hunger Vital Sign    Worried About Running Out of Food in the Last Year: Never true    Ran Out of Food in the Last Year: Never true  Transportation Needs: No Transportation Needs (10/02/2023)   PRAPARE - Administrator, Civil Service (Medical): No    Lack of Transportation (Non-Medical): No  Physical Activity: Insufficiently Active (10/02/2023)   Exercise Vital Sign    Days of Exercise per Week: 7 days    Minutes of Exercise per Session: 10 min  Stress: Stress Concern Present (10/02/2023)   [employer] of Occupational Health - Occupational Stress Questionnaire    Feeling of Stress : Rather much  Social Connections: Moderately Integrated (10/02/2023)   Social Connection and Isolation Panel [NHANES]    Frequency of Communication with Friends and Family: More than three times a week    Frequency of Social Gatherings with Friends and Family: More than three times a week    Attends Religious Services: More than 4 times per year    Active Member of [employer] or Organizations: Yes    Attends Engineer, structural: More than 4 times per  year    Marital Status: Divorced    Objective:  BP 132/70   Pulse 83   Temp (!) 97.4 F (36.3 C)   Ht 5\' 5"  (1.651 m)   Wt 258 lb (117 kg)   LMP 02/25/2015 (Approximate)   SpO2 97%   BMI 42.93 kg/m      04/29/2024    7:57 AM 04/29/2024    7:24 AM 03/16/2024    3:11 PM  BP/Weight  Systolic BP 132 144 140  Diastolic BP 70 80 72  Wt. (Lbs)  258 260  BMI  42.93 kg/m2 43.27 kg/m2    Physical Exam Vitals reviewed.  Constitutional:      Appearance: Normal appearance. She is obese.  HENT:     Right Ear: Tympanic membrane normal.     Left Ear: Tympanic membrane normal.     Nose: Congestion and rhinorrhea present.     Mouth/Throat:     Pharynx: No oropharyngeal exudate or posterior oropharyngeal erythema.  Neck:     Vascular: No carotid bruit.  Cardiovascular:     Rate and Rhythm: Normal rate and regular rhythm.     Heart sounds: Normal heart sounds.  Pulmonary:     Effort: Pulmonary effort is normal. No respiratory distress.     Breath sounds: Normal breath sounds.  Abdominal:     General: Abdomen is flat. Bowel sounds are normal.     Palpations: Abdomen is soft.     Tenderness: There is no abdominal tenderness.  Neurological:     Mental Status: She is alert and oriented to person, place, and time.  Psychiatric:        Mood and Affect: Mood normal.        Behavior: Behavior normal.     Diabetic Foot Exam - Simple   No data filed      Lab Results  Component Value Date   WBC 5.3 04/29/2024   HGB 14.0 04/29/2024   HCT  43.6 04/29/2024   PLT 187 04/29/2024   GLUCOSE 104 (H) 04/29/2024   CHOL 143 04/29/2024   TRIG 110 04/29/2024   HDL 52 04/29/2024   LDLCALC 71 04/29/2024   ALT 25 04/29/2024   AST 19 04/29/2024   NA 143 04/29/2024   K 4.3 04/29/2024   CL 104 04/29/2024   CREATININE 0.58 04/29/2024   BUN 10 04/29/2024   CO2 22 04/29/2024   TSH 0.813 01/31/2024   INR 1.01 05/26/2015      Assessment & Plan:  Primary hypertension Assessment &  Plan: Well controlled.  No changes to medicines.  Continue to work on eating a healthy diet and exercise.  Labs drawn today.    Orders: -     CBC with Differential/Platelet -     Comprehensive metabolic panel with GFR  Persistent atrial fibrillation (HCC) Assessment & Plan: Management per specialist   OSA (obstructive sleep apnea) Assessment & Plan: Use of a CPAP machine. CPAP doing well.    Mixed hyperlipidemia Assessment & Plan: History of statin intolerance. Currently on Repatha  140 mg subcutaneous injection once every 14 days. Tolerating well. Continue the same.   Orders: -     Lipid panel  Viral URI Assessment & Plan: Recommend mucinex and claritin. Gargle warm salt water.    Class 3 severe obesity due to excess calories with serious comorbidity and body mass index (BMI) of 40.0 to 44.9 in adult Assessment & Plan: Continue to work on diet and exercise.     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 Continue eliquis .       No orders of the defined types were placed in this encounter.   Orders Placed This Encounter  Procedures   CBC with Differential/Platelet   Comprehensive metabolic panel with GFR   Lipid panel     Follow-up: Return in about 6 months (around 10/29/2024) for chronic follow up.   I,Marla I Leal-Borjas,acting as a scribe for Mercy Stall, MD.,have documented all relevant documentation on the behalf of Mercy Stall, MD,as directed by  Mercy Stall, MD while in the presence of Mercy Stall, MD.   An After Visit Summary was printed and given to the patient.  I attest that I have reviewed this visit and agree with the plan scribed by my staff.  Mercy Stall, MD [facility] [phone]

## 2024-04-29 ENCOUNTER — Encounter: Payer: Self-pay | Admitting: Family Medicine

## 2024-04-29 ENCOUNTER — Ambulatory Visit (INDEPENDENT_AMBULATORY_CARE_PROVIDER_SITE_OTHER): Payer: PPO | Admitting: Family Medicine

## 2024-04-29 VITALS — BP 132/70 | HR 83 | Temp 97.4°F | Ht 65.0 in | Wt 258.0 lb

## 2024-04-29 DIAGNOSIS — E66813 Obesity, class 3: Secondary | ICD-10-CM

## 2024-04-29 DIAGNOSIS — Z6841 Body Mass Index (BMI) 40.0 and over, adult: Secondary | ICD-10-CM | POA: Diagnosis not present

## 2024-04-29 DIAGNOSIS — G4733 Obstructive sleep apnea (adult) (pediatric): Secondary | ICD-10-CM | POA: Diagnosis not present

## 2024-04-29 DIAGNOSIS — J069 Acute upper respiratory infection, unspecified: Secondary | ICD-10-CM

## 2024-04-29 DIAGNOSIS — G8194 Hemiplegia, unspecified affecting left nondominant side: Secondary | ICD-10-CM | POA: Diagnosis not present

## 2024-04-29 DIAGNOSIS — E782 Mixed hyperlipidemia: Secondary | ICD-10-CM | POA: Diagnosis not present

## 2024-04-29 DIAGNOSIS — I679 Cerebrovascular disease, unspecified: Secondary | ICD-10-CM

## 2024-04-29 DIAGNOSIS — I10 Essential (primary) hypertension: Secondary | ICD-10-CM

## 2024-04-29 DIAGNOSIS — I4819 Other persistent atrial fibrillation: Secondary | ICD-10-CM

## 2024-04-29 NOTE — Patient Instructions (Signed)
 Recommend mucinex and claritin. Gargle warm salt water.

## 2024-04-30 ENCOUNTER — Ambulatory Visit: Payer: Self-pay | Admitting: Family Medicine

## 2024-04-30 LAB — CBC WITH DIFFERENTIAL/PLATELET
Basophils Absolute: 0 10*3/uL (ref 0.0–0.2)
Basos: 0 %
EOS (ABSOLUTE): 0.1 10*3/uL (ref 0.0–0.4)
Eos: 1 %
Hematocrit: 43.6 % (ref 34.0–46.6)
Hemoglobin: 14 g/dL (ref 11.1–15.9)
Immature Grans (Abs): 0 10*3/uL (ref 0.0–0.1)
Immature Granulocytes: 0 %
Lymphocytes Absolute: 1.4 10*3/uL (ref 0.7–3.1)
Lymphs: 26 %
MCH: 28.5 pg (ref 26.6–33.0)
MCHC: 32.1 g/dL (ref 31.5–35.7)
MCV: 89 fL (ref 79–97)
Monocytes Absolute: 0.5 10*3/uL (ref 0.1–0.9)
Monocytes: 10 %
Neutrophils Absolute: 3.3 10*3/uL (ref 1.4–7.0)
Neutrophils: 63 %
Platelets: 187 10*3/uL (ref 150–450)
RBC: 4.92 x10E6/uL (ref 3.77–5.28)
RDW: 12.3 % (ref 11.7–15.4)
WBC: 5.3 10*3/uL (ref 3.4–10.8)

## 2024-04-30 LAB — COMPREHENSIVE METABOLIC PANEL WITH GFR
ALT: 25 IU/L (ref 0–32)
AST: 19 IU/L (ref 0–40)
Albumin: 4.4 g/dL (ref 3.8–4.8)
Alkaline Phosphatase: 80 IU/L (ref 44–121)
BUN/Creatinine Ratio: 17 (ref 12–28)
BUN: 10 mg/dL (ref 8–27)
Bilirubin Total: 0.3 mg/dL (ref 0.0–1.2)
CO2: 22 mmol/L (ref 20–29)
Calcium: 9.3 mg/dL (ref 8.7–10.3)
Chloride: 104 mmol/L (ref 96–106)
Creatinine, Ser: 0.58 mg/dL (ref 0.57–1.00)
Globulin, Total: 2.6 g/dL (ref 1.5–4.5)
Glucose: 104 mg/dL — ABNORMAL HIGH (ref 70–99)
Potassium: 4.3 mmol/L (ref 3.5–5.2)
Sodium: 143 mmol/L (ref 134–144)
Total Protein: 7 g/dL (ref 6.0–8.5)
eGFR: 97 mL/min/{1.73_m2} (ref >59)

## 2024-04-30 LAB — LIPID PANEL
Chol/HDL Ratio: 2.8 ratio (ref 0.0–4.4)
Cholesterol, Total: 143 mg/dL (ref 100–199)
HDL: 52 mg/dL (ref >39)
LDL Chol Calc (NIH): 71 mg/dL (ref 0–99)
Triglycerides: 110 mg/dL (ref 0–149)
VLDL Cholesterol Cal: 20 mg/dL (ref 5–40)

## 2024-04-30 NOTE — Assessment & Plan Note (Signed)
 History of statin intolerance. Currently on Repatha  140 mg subcutaneous injection once every 14 days. Tolerating well. Continue the same.

## 2024-04-30 NOTE — Assessment & Plan Note (Signed)
 Continue to work on diet and exercise

## 2024-04-30 NOTE — Assessment & Plan Note (Signed)
Compensating well. Continue eliquis.

## 2024-05-01 ENCOUNTER — Other Ambulatory Visit: Payer: Self-pay

## 2024-05-01 DIAGNOSIS — Z01812 Encounter for preprocedural laboratory examination: Secondary | ICD-10-CM

## 2024-05-01 DIAGNOSIS — I4819 Other persistent atrial fibrillation: Secondary | ICD-10-CM

## 2024-05-03 NOTE — Assessment & Plan Note (Signed)
 Management per specialist.

## 2024-05-03 NOTE — Assessment & Plan Note (Signed)
 Recommend mucinex and claritin. Gargle warm salt water.

## 2024-05-03 NOTE — Assessment & Plan Note (Signed)
 Use of a CPAP machine. CPAP doing well.

## 2024-05-03 NOTE — Assessment & Plan Note (Signed)
Well controlled.  ?No changes to medicines.  ?Continue to work on eating a healthy diet and exercise.  ?Labs drawn today.  ?

## 2024-05-12 ENCOUNTER — Telehealth: Payer: Self-pay | Admitting: Pharmacist

## 2024-05-12 MED ORDER — REPATHA SURECLICK 140 MG/ML ~~LOC~~ SOAJ: 140.0000 mg | SUBCUTANEOUS | 3 refills | Status: DC

## 2024-05-12 NOTE — Telephone Encounter (Signed)
 Repatha  refill requested. Prescription sent.

## 2024-06-02 ENCOUNTER — Other Ambulatory Visit: Payer: Self-pay | Admitting: Family Medicine

## 2024-06-02 ENCOUNTER — Ambulatory Visit (HOSPITAL_COMMUNITY)
Admission: RE | Admit: 2024-06-02 | Discharge: 2024-06-02 | Disposition: A | Discharge status: Home / Self Care | Source: Ambulatory Visit | Attending: Cardiology

## 2024-06-02 DIAGNOSIS — Z01812 Encounter for preprocedural laboratory examination: Secondary | ICD-10-CM | POA: Diagnosis not present

## 2024-06-02 DIAGNOSIS — I4819 Other persistent atrial fibrillation: Secondary | ICD-10-CM | POA: Diagnosis not present

## 2024-06-02 DIAGNOSIS — I10 Essential (primary) hypertension: Secondary | ICD-10-CM

## 2024-06-02 MED ORDER — IOHEXOL 350 MG/ML SOLN
100.0000 mL | Freq: Once | INTRAVENOUS | Status: AC | PRN
  Administered 2024-06-02: 100 mL via INTRAVENOUS

## 2024-06-03 ENCOUNTER — Ambulatory Visit: Payer: Self-pay

## 2024-06-03 LAB — BASIC METABOLIC PANEL WITH GFR
BUN/Creatinine Ratio: 21 (ref 12–28)
BUN: 11 mg/dL (ref 8–27)
CO2: 24 mmol/L (ref 20–29)
Calcium: 8.9 mg/dL (ref 8.7–10.3)
Chloride: 106 mmol/L (ref 96–106)
Creatinine, Ser: 0.52 mg/dL — AB (ref 0.57–1.00)
Glucose: 94 mg/dL (ref 70–99)
Potassium: 4.1 mmol/L (ref 3.5–5.2)
Sodium: 144 mmol/L (ref 134–144)
eGFR: 99 mL/min/1.73 (ref >59)

## 2024-06-03 LAB — CBC
Hematocrit: 39.4 % (ref 34.0–46.6)
Hemoglobin: 12.6 g/dL (ref 11.1–15.9)
MCH: 28.5 pg (ref 26.6–33.0)
MCHC: 32 g/dL (ref 31.5–35.7)
MCV: 89 fL (ref 79–97)
Platelets: 192 x10E3/uL (ref 150–450)
RBC: 4.42 x10E6/uL (ref 3.77–5.28)
RDW: 12.2 % (ref 11.7–15.4)
WBC: 7.3 x10E3/uL (ref 3.4–10.8)

## 2024-06-12 SURGERY — ATRIAL FIBRILLATION ABLATION
Anesthesia: General

## 2024-06-16 ENCOUNTER — Telehealth (HOSPITAL_COMMUNITY): Payer: Self-pay

## 2024-06-16 NOTE — Telephone Encounter (Signed)
 Patient returned call to discuss upcoming procedure.   CT: completed.  Labs: completed.   Any recent signs of acute illness or been started on antibiotics? No Any new medications started? No Any medications to hold? No Any missed doses of blood thinner?  No Advised patient to continue taking ANTICOAGULANT: Eliquis  (Apixaban ) twice daily without missing any doses.  Medication instructions:  On the morning of your procedure DO NOT take any medication., including Eliquis  or the procedure may be rescheduled. Nothing to eat or drink after midnight prior to your procedure.  Confirmed patient is scheduled for Atrial Fibrillation Ablation on Tuesday, July 29 with Dr. Soyla Norton. Instructed patient to arrive at the Main Entrance A at [facility]: [id number] [place], [place] [id number] and check in at Admitting at 5:30 AM.  Advised of plan to go home the same day and will only stay overnight if medically necessary. You MUST have a responsible adult to drive you home and MUST be with you the first 24 hours after you arrive home or your procedure could be cancelled.  Patient verbalized understanding to all instructions provided and agreed to proceed with procedure.

## 2024-06-16 NOTE — Telephone Encounter (Signed)
 Attempted to reach patient to discuss upcoming procedure. Patient stated she had poor cell phone signal and requested a call back later.

## 2024-06-18 ENCOUNTER — Encounter: Payer: Self-pay | Admitting: Family Medicine

## 2024-06-18 ENCOUNTER — Encounter: Payer: Self-pay | Admitting: Emergency Medicine

## 2024-06-18 ENCOUNTER — Other Ambulatory Visit: Payer: Self-pay

## 2024-06-18 MED ORDER — AMLODIPINE BESYLATE 5 MG PO TABS: 5.0000 mg | ORAL_TABLET | Freq: Every day | ORAL | 0 refills | Status: DC

## 2024-06-22 NOTE — Pre-Procedure Instructions (Signed)
 Instructed patient on the following items: Arrival time 0515 Nothing to eat or drink after midnight No meds AM of procedure Responsible person to drive you home and stay with you for 24 hrs  Have you missed any doses of anti-coagulant Eliquis- takes twice a day, hasn't missed any doses.  Don't take dose morning of procedure.

## 2024-06-23 ENCOUNTER — Ambulatory Visit (HOSPITAL_COMMUNITY)
Admission: RE | Admit: 2024-06-23 | Discharge: 2024-06-23 | Disposition: A | Discharge status: Home / Self Care | Attending: Cardiology | Admitting: Cardiology

## 2024-06-23 ENCOUNTER — Other Ambulatory Visit: Payer: Self-pay

## 2024-06-23 ENCOUNTER — Encounter (HOSPITAL_COMMUNITY)
Admission: RE | Disposition: A | Discharge status: Home / Self Care | Payer: Self-pay | Source: Home / Self Care | Attending: Cardiology

## 2024-06-23 ENCOUNTER — Ambulatory Visit (HOSPITAL_COMMUNITY)

## 2024-06-23 DIAGNOSIS — I700 Atherosclerosis of aorta: Secondary | ICD-10-CM | POA: Diagnosis not present

## 2024-06-23 DIAGNOSIS — Z79899 Other long term (current) drug therapy: Secondary | ICD-10-CM | POA: Insufficient documentation

## 2024-06-23 DIAGNOSIS — I10 Essential (primary) hypertension: Secondary | ICD-10-CM

## 2024-06-23 DIAGNOSIS — I4819 Other persistent atrial fibrillation: Secondary | ICD-10-CM | POA: Diagnosis not present

## 2024-06-23 DIAGNOSIS — I2510 Atherosclerotic heart disease of native coronary artery without angina pectoris: Secondary | ICD-10-CM

## 2024-06-23 DIAGNOSIS — I4891 Unspecified atrial fibrillation: Secondary | ICD-10-CM | POA: Diagnosis not present

## 2024-06-23 DIAGNOSIS — Z8673 Personal history of transient ischemic attack (TIA), and cerebral infarction without residual deficits: Secondary | ICD-10-CM | POA: Insufficient documentation

## 2024-06-23 DIAGNOSIS — Z6841 Body Mass Index (BMI) 40.0 and over, adult: Secondary | ICD-10-CM | POA: Diagnosis not present

## 2024-06-23 DIAGNOSIS — E669 Obesity, unspecified: Secondary | ICD-10-CM | POA: Insufficient documentation

## 2024-06-23 DIAGNOSIS — G4730 Sleep apnea, unspecified: Secondary | ICD-10-CM | POA: Diagnosis not present

## 2024-06-23 HISTORY — PX: ATRIAL FIBRILLATION ABLATION: EP1191

## 2024-06-23 LAB — POCT ACTIVATED CLOTTING TIME: Activated Clotting Time: 331 s

## 2024-06-23 SURGERY — ATRIAL FIBRILLATION ABLATION
Anesthesia: General

## 2024-06-23 MED ORDER — SODIUM CHLORIDE 0.9 % IV SOLN: 250.0000 mL | INTRAVENOUS | Status: DC | PRN

## 2024-06-23 MED ORDER — DEXAMETHASONE SODIUM PHOSPHATE 10 MG/ML IJ SOLN
INTRAMUSCULAR | Status: DC | PRN
  Administered 2024-06-23: 10 mg via INTRAVENOUS

## 2024-06-23 MED ORDER — SODIUM CHLORIDE 0.9 % IV SOLN: INTRAVENOUS | Status: DC

## 2024-06-23 MED ORDER — SUGAMMADEX SODIUM 200 MG/2ML IV SOLN
INTRAVENOUS | Status: DC | PRN
  Administered 2024-06-23: 200 mg via INTRAVENOUS

## 2024-06-23 MED ORDER — HEPARIN SODIUM (PORCINE) 1000 UNIT/ML IJ SOLN
INTRAMUSCULAR | Status: DC | PRN
  Administered 2024-06-23: 15000 [IU] via INTRAVENOUS

## 2024-06-23 MED ORDER — ONDANSETRON HCL 4 MG/2ML IJ SOLN: 4.0000 mg | Freq: Four times a day (QID) | INTRAMUSCULAR | Status: DC | PRN

## 2024-06-23 MED ORDER — ROCURONIUM BROMIDE 10 MG/ML (PF) SYRINGE
PREFILLED_SYRINGE | INTRAVENOUS | Status: DC | PRN
  Administered 2024-06-23: 20 mg via INTRAVENOUS
  Administered 2024-06-23: 50 mg via INTRAVENOUS

## 2024-06-23 MED ORDER — PROPOFOL 10 MG/ML IV BOLUS
INTRAVENOUS | Status: DC | PRN
  Administered 2024-06-23: 100 mg via INTRAVENOUS
  Administered 2024-06-23: 50 mg via INTRAVENOUS

## 2024-06-23 MED ORDER — PHENYLEPHRINE HCL-NACL 20-0.9 MG/250ML-% IV SOLN
INTRAVENOUS | Status: DC | PRN
  Administered 2024-06-23: 50 ug/min via INTRAVENOUS

## 2024-06-23 MED ORDER — PROTAMINE SULFATE 10 MG/ML IV SOLN
INTRAVENOUS | Status: DC | PRN
  Administered 2024-06-23: 40 mg via INTRAVENOUS

## 2024-06-23 MED ORDER — ONDANSETRON HCL 4 MG/2ML IJ SOLN
INTRAMUSCULAR | Status: DC | PRN
  Administered 2024-06-23: 4 mg via INTRAVENOUS

## 2024-06-23 MED ORDER — ATROPINE SULFATE 1 MG/10ML IJ SOSY
PREFILLED_SYRINGE | INTRAMUSCULAR | Status: DC | PRN
Start: 2024-06-23 — End: 2024-06-23
  Administered 2024-06-23: 1 mg via INTRAVENOUS

## 2024-06-23 MED ORDER — SODIUM CHLORIDE 0.9% FLUSH: 3.0000 mL | INTRAVENOUS | Status: DC | PRN

## 2024-06-23 MED ORDER — HEPARIN (PORCINE) IN NACL 1000-0.9 UT/500ML-% IV SOLN
INTRAVENOUS | Status: DC | PRN
  Administered 2024-06-23 (×3): 500 mL

## 2024-06-23 MED ORDER — PROPOFOL 500 MG/50ML IV EMUL
INTRAVENOUS | Status: DC | PRN
Start: 2024-06-23 — End: 2024-06-23
  Administered 2024-06-23: 150 ug/kg/min via INTRAVENOUS

## 2024-06-23 MED ORDER — ACETAMINOPHEN 325 MG PO TABS: 650.0000 mg | ORAL_TABLET | ORAL | Status: DC | PRN

## 2024-06-23 SURGICAL SUPPLY — 19 items
CABLE FARASTAR GEN2 SNGL USE (CABLE) IMPLANT
CATH FARAWAVE 2.0 31 (CATHETERS) IMPLANT
CATH GE 8FR SOUNDSTAR (CATHETERS) IMPLANT
CATH OCTARAY 2.0 F 3-3-3-3-3 (CATHETERS) IMPLANT
CATH WEBSTER BI DIR CS D-F CRV (CATHETERS) IMPLANT
CLOSURE MYNX CONTROL 6F/7F (Vascular Products) IMPLANT
CLOSURE PERCLOSE PROSTYLE (VASCULAR PRODUCTS) IMPLANT
COVER SWIFTLINK CONNECTOR (BAG) ×1 IMPLANT
DILATOR VESSEL 38 20CM 16FR (INTRODUCER) IMPLANT
GUIDEWIRE INQWIRE 1.5J.035X260 (WIRE) IMPLANT
KIT VERSACROSS CNCT FARADRIVE (KITS) IMPLANT
MAT PREVALON FULL STRYKER (MISCELLANEOUS) IMPLANT
PACK EP LF (CUSTOM PROCEDURE TRAY) ×1 IMPLANT
PAD DEFIB RADIO PHYSIO CONN (PAD) ×1 IMPLANT
PATCH CARTO3 (PAD) IMPLANT
SHEATH FARADRIVE STEERABLE (SHEATH) IMPLANT
SHEATH PINNACLE 8F 10CM (SHEATH) IMPLANT
SHEATH PINNACLE 9F 10CM (SHEATH) IMPLANT
SHEATH PROBE COVER 6X72 (BAG) IMPLANT

## 2024-06-23 NOTE — Anesthesia Procedure Notes (Signed)
 Procedure Name: Intubation Date/Time: 06/23/2024 7:42 AM  Performed by: Mannie Krystal LABOR, CRNAPre-anesthesia Checklist: Patient identified, Emergency Drugs available, Suction available and Patient being monitored Patient Re-evaluated:Patient Re-evaluated prior to induction Oxygen Delivery Method: Circle system utilized Preoxygenation: Pre-oxygenation with 100% oxygen Induction Type: IV induction Ventilation: Mask ventilation without difficulty Laryngoscope Size: Miller and 2 Grade View: Grade I Tube type: Oral Tube size: 6.5 mm Number of attempts: 1 Airway Equipment and Method: Stylet and Oral airway Placement Confirmation: ETT inserted through vocal cords under direct vision, positive ETCO2 and breath sounds checked- equal and bilateral Secured at: 21 cm Tube secured with: Tape Dental Injury: Teeth and Oropharynx as per pre-operative assessment

## 2024-06-23 NOTE — Anesthesia Preprocedure Evaluation (Signed)
 Anesthesia Evaluation  Patient identified by MRN, date of birth, ID band Patient awake    Reviewed: Allergy & Precautions, NPO status , Patient's Chart, lab work & pertinent test results  History of Anesthesia Complications Negative for: history of anesthetic complications  Airway Mallampati: III  TM Distance: >3 FB Neck ROM: Full    Dental no notable dental hx. (+) Teeth Intact   Pulmonary sleep apnea and Continuous Positive Airway Pressure Ventilation , neg COPD, Patient abstained from smoking.Not current smoker   Pulmonary exam normal breath sounds clear to auscultation       Cardiovascular Exercise Tolerance: Good METShypertension, + CAD  (-) Past MI + dysrhythmias Atrial Fibrillation  Rhythm:Irregular Rate:Normal - Systolic murmurs . Left ventricular ejection fraction, by estimation, is 60 to 65%. The  left ventricle has normal function. The left ventricle has no regional  wall motion abnormalities. Left ventricular diastolic parameters are  consistent with Grade I diastolic  dysfunction (impaired relaxation). 3D and stain not performed.   2. Right ventricular systolic function is normal. The right ventricular  size is normal.   3. The mitral valve is normal in structure. No evidence of mitral valve  regurgitation. No evidence of mitral stenosis.   4. The aortic valve is normal in structure. Aortic valve regurgitation is  not visualized. No aortic stenosis is present.   5. The inferior vena cava is normal in size with greater than 50%  respiratory variability, suggesting right atrial pressure of 3 mmHg.     Neuro/Psych  PSYCHIATRIC DISORDERS Anxiety     CVA, No Residual Symptoms    GI/Hepatic ,neg GERD  ,,(+)     (-) substance abuse    Endo/Other  neg diabetes    Renal/GU negative Renal ROS     Musculoskeletal   Abdominal  (+) + obese  Peds  Hematology   Anesthesia Other Findings Past Medical  History: No date: A-fib (HCC) 08/23/2021: Acquired thrombophilia (HCC) 12/01/2020: Anxiety No date: Asthma     Comment:  as a child 09/16/2020: Atrial fibrillation (HCC) 12/01/2020: Chronic anticoagulation 08/23/2021: Class 3 severe obesity due to excess calories with  serious comorbidity and body mass index (BMI) of 40.0 to 44.9 in adult 09/03/2011: Closed fracture of right acetabulum (HCC) 09/03/2011: Enthesopathy of hip region 05/07/2023: Hemiparesis of left nondominant side due to  cerebrovascular disease (HCC) 09/16/2020: History of CVA (cerebrovascular accident) No date: Hypercholesterolemia No date: Hypertension 09/16/2020: Mixed hyperlipidemia 09/16/2020: Morbid obesity (HCC) 01/31/2024: Nonspecific abnormal electrocardiogram (ECG) (EKG) 03/31/2021: OSA (obstructive sleep apnea)     Comment:  Optimal PAP 14   09/03/2011: 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/16/2020: Primary hypertension 06/08/2015: Primary localized osteoarthrosis of pelvic region 09/03/2011: Right hip pain No date: Scleroderma, 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, post-stroke 09/12/2020: Stroke (HCC)     Comment:  left side weakness/speech affected No date: Wears glasses  Reproductive/Obstetrics                              Anesthesia Physical Anesthesia Plan  ASA: 3  Anesthesia Plan: General   Post-op Pain Management: Ofirmev  IV (intra-op)*   Induction: Intravenous  PONV Risk Score and Plan: 3 and Ondansetron , Dexamethasone  and Treatment may vary due to age or medical condition  Airway Management Planned: Oral ETT  Additional Equipment: None  Intra-op Plan:   Post-operative Plan: Extubation in OR  Informed Consent: I have reviewed the patients History and Physical, chart, labs and discussed the procedure  including the risks, benefits and alternatives for the proposed anesthesia with the patient or authorized representative who has indicated his/her  understanding and acceptance.     Dental advisory given  Plan Discussed with: CRNA and Surgeon  Anesthesia Plan Comments: (Discussed risks of anesthesia with patient, including PONV, sore throat, lip/dental/eye damage. Rare risks discussed as well, such as cardiorespiratory and neurological sequelae, and allergic reactions. Discussed the role of CRNA in patient's perioperative care. Patient understands.)         Anesthesia Quick Evaluation

## 2024-06-23 NOTE — Anesthesia Postprocedure Evaluation (Signed)
 Anesthesia Post Note  Patient: Teresa Blair  Procedure(s) Performed: ATRIAL FIBRILLATION ABLATION     Patient location during evaluation: PACU Anesthesia Type: General Level of consciousness: awake and alert Pain management: pain level controlled Vital Signs Assessment: post-procedure vital signs reviewed and stable Respiratory status: spontaneous breathing, nonlabored ventilation, respiratory function stable and patient connected to nasal cannula oxygen Cardiovascular status: blood pressure returned to baseline and stable Postop Assessment: no apparent nausea or vomiting Anesthetic complications: no   There were no known notable events for this encounter.  Last Vitals:  Vitals:   06/23/24 0920 06/23/24 0939  BP: 118/78 115/65  Pulse: 72 68  Resp: 19 (!) 21  Temp:    SpO2: 94% 91%    Last Pain:  Vitals:   06/23/24 0940  TempSrc:   PainSc: 0-No pain   Pain Goal:                   Rome Ade

## 2024-06-23 NOTE — H&P (Signed)
  Electrophysiology Office Note:   Date:  06/23/2024  ID:  Teresa, Blair [birth year], MRN [id number]  Primary Cardiologist: Alean SAUNDERS Madireddy, MD Primary Heart Failure: None Electrophysiologist: Zeidy Tayag Gladis Norton, MD      History of Present Illness:   Teresa Blair is a 73 y.o. female with h/o atrial fibrillation, CVA, hypertension, obesity, sleep apnea seen today for routine electrophysiology followup.   Post atrial fibrillation ablation 10/17/2021.  She had COVID-19 infection and had episodes of atrial fibrillation during that infection.  She did well until most recently when she wore a cardiac monitor that showed a 42% atrial fibrillation burden.  Today, denies symptoms of palpitations, chest pain, dyspnea, orthopnea, PND, lower extremity edema, claudication, dizziness, presyncope, syncope, bleeding, or neurologic sequela. The patient is tolerating medications without difficulties. Plan ablation today.   EP Information / Studies Reviewed:    EKG is ordered today. Personal review as below.        Risk Assessment/Calculations:    CHA2DS2-VASc Score = 5   This indicates a 7.2% annual risk of stroke. The patient's score is based upon: CHF History: 0 HTN History: 1 Diabetes History: 0 Stroke History: 2 Vascular Disease History: 0 Age Score: 1 Gender Score: 1            Physical Exam:   VS:  BP (!) 160/73   Pulse 89   Temp 98.2 F (36.8 C) (Oral)   Resp 20   Ht 5' 5 (1.651 m)   Wt 113.4 kg   LMP 02/25/2015 (Approximate)   SpO2 96%   BMI 41.60 kg/m    Wt Readings from Last 3 Encounters:  06/23/24 113.4 kg  04/29/24 117 kg  03/16/24 117.9 kg    GEN: No acute distress.   Neck: No JVD Cardiac: irregular.  Respiratory: normal BS bilaterally. GI: Soft, nontender, non-distended  MS: No edema; No deformity. Neuro:  Nonfocal  Skin: warm and dry Psych: Normal affect    ASSESSMENT AND PLAN:    1.  Persistent atrial fibrillation:Teresa Blair has  presented today for surgery, with the diagnosis of AF.  The various methods of treatment have been discussed with the patient and family. After consideration of risks, benefits and other options for treatment, the patient has consented to  Procedure(s): Catheter ablation as a surgical intervention .  Risks include but not limited to complete heart block, stroke, esophageal damage, nerve damage, bleeding, vascular damage, tamponade, perforation, MI, and death. The patient's history has been reviewed, patient examined, no change in status, stable for surgery.  I have reviewed the patient's chart and labs.  Questions were answered to the patient's satisfaction.    Cesario Weidinger Norton, MD 06/23/2024 7:11 AM

## 2024-06-23 NOTE — Transfer of Care (Signed)
 Immediate Anesthesia Transfer of Care Note  Patient: Teresa Blair  Procedure(s) Performed: ATRIAL FIBRILLATION ABLATION  Patient Location: PACU  Anesthesia Type:General  Level of Consciousness: awake  Airway & Oxygen Therapy: Patient connected to face mask oxygen  Post-op Assessment: Report given to RN and Post -op Vital signs reviewed and stable  Post vital signs: Reviewed and stable  Last Vitals:  Vitals Value Taken Time  BP 118/67 06/23/24 09:00  Temp    Pulse 75 06/23/24 09:02  Resp 22 06/23/24 09:02  SpO2 96 % 06/23/24 09:02  Vitals shown include unfiled device data.  Last Pain:  Vitals:   06/23/24 0537  TempSrc: Oral         Complications: There were no known notable events for this encounter.

## 2024-06-23 NOTE — Discharge Instructions (Signed)

## 2024-06-24 ENCOUNTER — Encounter (HOSPITAL_COMMUNITY): Payer: Self-pay | Admitting: Cardiology

## 2024-06-24 ENCOUNTER — Telehealth (HOSPITAL_COMMUNITY): Payer: Self-pay

## 2024-06-24 NOTE — Telephone Encounter (Signed)
 Attempted to reach patient to follow up with procedure completed on 06/23/24, no answer at cell phone and unable to leave VM, mailbox full. No answer or VM available at home number.

## 2024-06-24 NOTE — Telephone Encounter (Signed)
 Spoke with patient to complete post procedure follow up call.  Patient reports no complications with groin sites.   Instructions reviewed with patient:  Remove large bandage at puncture site after 24 hours. It is normal to have bruising, tenderness, mild swelling, and a pea or marble sized lump/knot at the groin site which can take up to three months to resolve.  Get help right away if you notice sudden swelling at the puncture site.  Check your puncture site every day for signs of infection: fever, redness, swelling, pus drainage, warmth, foul odor or excessive pain. If this occurs, please call the office at [phone], to speak with the nurse. Get help right away if your puncture site is bleeding and the bleeding does not stop after applying firm pressure to the area.  You may continue to have skipped beats/ atrial fibrillation during the first several months after your procedure.  It is very important not to miss any doses of your blood thinner Eliquis . You will follow up with the Afib clinic on 07/21/24 and follow up with the Afib clinic on 09/21/24.   Patient verbalized understanding to all instructions provided.

## 2024-06-30 ENCOUNTER — Other Ambulatory Visit: Payer: Self-pay | Admitting: Cardiology

## 2024-06-30 DIAGNOSIS — I4819 Other persistent atrial fibrillation: Secondary | ICD-10-CM

## 2024-07-06 ENCOUNTER — Encounter: Payer: Self-pay | Admitting: Cardiology

## 2024-07-07 ENCOUNTER — Other Ambulatory Visit: Payer: Self-pay

## 2024-07-07 DIAGNOSIS — I4819 Other persistent atrial fibrillation: Secondary | ICD-10-CM

## 2024-07-07 MED ORDER — ELIQUIS 5 MG PO TABS: 5.0000 mg | ORAL_TABLET | Freq: Two times a day (BID) | ORAL | 5 refills | Status: DC

## 2024-07-07 NOTE — Telephone Encounter (Signed)
 Prescription refill request for Eliquis  received. Indication:afib Last office visit:4/25 Scr:0.52  7/25 Age: 73 Weight:113.4  kg  Prescription refilled

## 2024-07-21 ENCOUNTER — Encounter (HOSPITAL_COMMUNITY): Payer: Self-pay | Admitting: Internal Medicine

## 2024-07-21 ENCOUNTER — Ambulatory Visit (HOSPITAL_COMMUNITY)
Admission: RE | Admit: 2024-07-21 | Discharge: 2024-07-21 | Disposition: A | Discharge status: Home / Self Care | Source: Ambulatory Visit | Attending: Internal Medicine | Admitting: Internal Medicine

## 2024-07-21 VITALS — BP 142/62 | HR 75 | Ht 65.0 in | Wt 252.6 lb

## 2024-07-21 DIAGNOSIS — D6869 Other thrombophilia: Secondary | ICD-10-CM | POA: Diagnosis not present

## 2024-07-21 DIAGNOSIS — I4819 Other persistent atrial fibrillation: Secondary | ICD-10-CM | POA: Diagnosis not present

## 2024-07-21 NOTE — Progress Notes (Signed)
 Primary Care Physician: Sherre Clapper, MD Referring Physician: Dr. Inocencio Venus LELON Teresa Blair is a 73 y.o. female with a history of CVA, HLD, CAD, HTN, obesity, sleep apnea, and Afib. History of Afib ablation on 10/17/21. She is on Eliquis  5 mg BID for a Chadsvasc score of at least 6.  On evaluation today, patient is currently in NSR. S/p Afib ablation on 06/23/24 by Dr. Inocencio. No episodes of Afib since ablation. No chest pain or SOB. Leg sites healed without issue. No missed doses of anticoagulant.  Today, she denies symptoms of orthopnea, PND, lower extremity edema, dizziness, presyncope, syncope, snoring, daytime somnolence, bleeding, or neurologic sequela. The patient is tolerating medications without difficulties and is otherwise without complaint today.    Past Medical History:  Diagnosis Date   A-fib [facility])    Acquired thrombophilia (HCC) 08/23/2021   Anxiety 12/01/2020   Asthma    as a child   Atrial fibrillation (HCC) 09/16/2020   Chronic anticoagulation 12/01/2020   Class 3 severe obesity due to excess calories with serious comorbidity and body mass index (BMI) of 40.0 to 44.9 in adult 08/23/2021   Closed fracture of right acetabulum (HCC) 09/03/2011   Enthesopathy of hip region 09/03/2011   Hemiparesis of left nondominant side due to cerebrovascular disease (HCC) 05/07/2023   History of CVA (cerebrovascular accident) 09/16/2020   Hypercholesterolemia    Hypertension    Mixed hyperlipidemia 09/16/2020   Morbid obesity (HCC) 09/16/2020   Nonspecific abnormal electrocardiogram (ECG) (EKG) 01/31/2024   OSA (obstructive sleep apnea) 03/31/2021   Optimal PAP 14     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   Primary hypertension 09/16/2020   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   Right hip pain 09/03/2011   Scleroderma, localized    Sequelae, 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   Stroke (HCC) 09/12/2020   left side weakness/speech affected    Wears glasses    Past Surgical History:  Procedure Laterality Date   ATRIAL FIBRILLATION ABLATION N/A 10/17/2021   Procedure: ATRIAL FIBRILLATION ABLATION;  Surgeon: Inocencio Soyla Lunger, MD;  Location: [facility] INVASIVE CV LAB;  Service: Cardiovascular;  Laterality: N/A;   ATRIAL FIBRILLATION ABLATION N/A 06/23/2024   Procedure: ATRIAL FIBRILLATION ABLATION;  Surgeon: Inocencio Soyla Lunger, MD;  Location: [facility] INVASIVE CV LAB;  Service: Cardiovascular;  Laterality: N/A;   BUBBLE STUDY  05/04/2021   Procedure: BUBBLE STUDY;  Surgeon: Loni Soyla LABOR, MD;  Location: [facility] ENDOSCOPY;  Service: Cardiovascular;;   CARDIOVERSION N/A 05/04/2021   Procedure: CARDIOVERSION;  Surgeon: Loni Soyla LABOR, MD;  Location: [facility] ENDOSCOPY;  Service: Cardiovascular;  Laterality: N/A;   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 06/08/2015   Procedure: CONVERSION TO TOTAL HIP, ANTERIOR TOTAL HIP;  Surgeon: Toribio Chancy, MD;  Location: [facility] OR;  Service: Orthopedics;  Laterality: Right;   FRACTURE SURGERY     right hip   TEE WITHOUT CARDIOVERSION N/A 05/04/2021   Procedure: TRANSESOPHAGEAL ECHOCARDIOGRAM (TEE);  Surgeon: Loni Soyla LABOR, MD;  Location: [facility] ENDOSCOPY;  Service: Cardiovascular;  Laterality: N/A;   TOTAL HIP ARTHROPLASTY Right 06/08/2015   TUBAL LIGATION     WISDOM TOOTH EXTRACTION      Current Outpatient Medications  Medication Sig Dispense Refill   amLODipine  (NORVASC ) 5 MG tablet Take 1 tablet (5 mg total) by mouth daily. 90 tablet 0   CALCIUM -VITAMIN D PO Take 1 tablet by mouth daily.     diclofenac Sodium (VOLTAREN) 1 % GEL Apply 1 application  topically daily as needed (  pain).     ELIQUIS  5 MG TABS tablet Take 1 tablet (5 mg total) by mouth 2 (two) times daily. 60 tablet 5   Evolocumab  (REPATHA  SURECLICK) 140 MG/ML SOAJ Inject 140 mg into the skin every 14 (fourteen) days. 6 mL 3   metoprolol  succinate (TOPROL -XL) 50 MG 24 hr tablet Take 1 tablet (50 mg total) by mouth daily. Take with or immediately following a  meal. 90 tablet 3   No current facility-administered medications for this encounter.    Allergies  Allergen Reactions   Perflutren Shortness Of Breath    Protein contrast (pain radiating from feet through spine)   Atorvastatin  Other (See Comments)    Joint pain, aches   Other Hives and Swelling    Poppy seeds   Pravastatin Other (See Comments)    Joint pain, aches   Sulfa Antibiotics Hives   Sulfamethoxazole Rash   ROS- All systems are reviewed and negative except as per the HPI above  Physical Exam: Vitals:   07/21/24 1307  BP: (!) 142/62  Pulse: 75  Weight: 114.6 kg  Height: 5' 5 (1.651 m)    Wt Readings from Last 3 Encounters:  07/21/24 114.6 kg  06/23/24 113.4 kg  04/29/24 117 kg    Labs: Lab Results  Component Value Date   NA 144 06/02/2024   K 4.1 06/02/2024   CL 106 06/02/2024   CO2 24 06/02/2024   GLUCOSE 94 06/02/2024   BUN 11 06/02/2024   CREATININE 0.52 (L) 06/02/2024   CALCIUM  8.9 06/02/2024   MG 2.1 01/31/2024   Lab Results  Component Value Date   INR 1.01 05/26/2015   Lab Results  Component Value Date   CHOL 143 04/29/2024   HDL 52 04/29/2024   LDLCALC 71 04/29/2024   TRIG 110 04/29/2024   GEN- The patient is well appearing, alert and oriented x 3 today.   Neck - no JVD or carotid bruit noted Lungs- Clear to ausculation bilaterally, normal work of breathing Heart- Regular rate and rhythm, no murmurs, rubs or gallops, PMI not laterally displaced Extremities- no clubbing, cyanosis, or edema Skin - no rash or ecchymosis noted   EKG- Vent. rate 75 BPM PR interval 192 ms QRS duration 90 ms QT/QTcB 382/426 ms P-R-T axes 63 31 45 Normal sinus rhythm Cannot rule out Anterior infarct , age undetermined Abnormal ECG When compared with ECG of 23-Jun-2024 09:12, No significant change was found Confirmed by Croitoru, Mihai [phone]) on 07/21/2024 2:38:39 PM  ECHO 01/31/24:  1. Left ventricular ejection fraction, by estimation, is 60 to  65%. The  left ventricle has normal function. The left ventricle has no regional  wall motion abnormalities. Left ventricular diastolic parameters are  consistent with Grade I diastolic  dysfunction (impaired relaxation). 3D and stain not performed.   2. Right ventricular systolic function is normal. The right ventricular  size is normal.   3. The mitral valve is normal in structure. No evidence of mitral valve  regurgitation. No evidence of mitral stenosis.   4. The aortic valve is normal in structure. Aortic valve regurgitation is  not visualized. No aortic stenosis is present.   5. The inferior vena cava is normal in size with greater than 50%  respiratory variability, suggesting right atrial pressure of 3 mmHg.   CHA2DS2-VASc Score = 6  The patient's score is based upon: CHF History: 0 HTN History: 1 Diabetes History: 0 Stroke History: 2 Vascular Disease History: 1 Age Score: 1 Gender Score:  1       ASSESSMENT AND PLAN: Persistent Atrial Fibrillation (ICD10:  I48.19) The patient's CHA2DS2-VASc score is 6, indicating a 9.7% annual risk of stroke.   S/p Afib ablation on 10/17/21. S/p Afib ablation on 06/23/24 by Dr. Inocencio.  Patient is currently in NSR. Continue Toprol  50 mg daily.    Secondary Hypercoagulable State (ICD10:  D68.69) The patient is at significant risk for stroke/thromboembolism based upon her CHA2DS2-VASc Score of 6.  Continue Apixaban  (Eliquis ).  No missed doses.     Follow up with EP as scheduled.    Dorn Heinrich, PA-C Afib Clinic [facility] [address] [place], [place] [address] [phone]

## 2024-08-01 ENCOUNTER — Other Ambulatory Visit: Payer: Self-pay | Admitting: Family Medicine

## 2024-08-01 DIAGNOSIS — I10 Essential (primary) hypertension: Secondary | ICD-10-CM

## 2024-09-21 ENCOUNTER — Ambulatory Visit (HOSPITAL_COMMUNITY)
Admission: RE | Admit: 2024-09-21 | Discharge: 2024-09-21 | Disposition: A | Discharge status: Home / Self Care | Source: Ambulatory Visit | Attending: Internal Medicine | Admitting: Internal Medicine

## 2024-09-21 ENCOUNTER — Encounter (HOSPITAL_COMMUNITY): Payer: Self-pay | Admitting: Internal Medicine

## 2024-09-21 VITALS — BP 122/60 | HR 76 | Ht 65.0 in | Wt 257.4 lb

## 2024-09-21 DIAGNOSIS — D6869 Other thrombophilia: Secondary | ICD-10-CM

## 2024-09-21 DIAGNOSIS — I4819 Other persistent atrial fibrillation: Secondary | ICD-10-CM

## 2024-09-21 NOTE — Progress Notes (Signed)
 Primary Care Physician: Sherre Clapper, MD Referring Physician: Dr. Inocencio Venus LELON Teresa Blair is a 73 y.o. female with a history of CVA, HLD, CAD, HTN, obesity, sleep apnea, and Afib. History of Afib ablation on 10/17/21. She is on Eliquis  5 mg BID for a Chadsvasc score of at least 6. S/p Afib ablation on 06/23/24 by Dr. Inocencio.   On follow up, 09/21/24. Patient is here for her 90 day post ablation visit. She has had overall no Afib burden since last visit. 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 No bleeding issues on Eliquis .   Today, she denies symptoms of orthopnea, PND, lower extremity edema, dizziness, presyncope, syncope, snoring, daytime somnolence, bleeding, or neurologic sequela. The patient is tolerating medications without difficulties and is otherwise without complaint today.    Past Medical History:  Diagnosis Date   A-fib [facility])    Acquired thrombophilia 08/23/2021   Anxiety 12/01/2020   Asthma    as a child   Atrial fibrillation (HCC) 09/16/2020   Chronic anticoagulation 12/01/2020   Class 3 severe obesity due to excess calories with serious comorbidity and body mass index (BMI) of 40.0 to 44.9 in adult (HCC) 08/23/2021   Closed fracture of right acetabulum (HCC) 09/03/2011   Enthesopathy of hip region 09/03/2011   Hemiparesis of left nondominant side due to cerebrovascular disease (HCC) 05/07/2023   History of CVA (cerebrovascular accident) 09/16/2020   Hypercholesterolemia    Hypertension    Mixed hyperlipidemia 09/16/2020   Morbid obesity (HCC) 09/16/2020   Nonspecific abnormal electrocardiogram (ECG) (EKG) 01/31/2024   OSA (obstructive sleep apnea) 03/31/2021   Optimal PAP 14     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   Primary hypertension 09/16/2020   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   Right hip pain 09/03/2011   Scleroderma, localized    Sequelae, 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   Stroke (HCC) 09/12/2020    left side weakness/speech affected   Wears glasses    Past Surgical History:  Procedure Laterality Date   ATRIAL FIBRILLATION ABLATION N/A 10/17/2021   Procedure: ATRIAL FIBRILLATION ABLATION;  Surgeon: Inocencio Soyla Lunger, MD;  Location: [facility] INVASIVE CV LAB;  Service: Cardiovascular;  Laterality: N/A;   ATRIAL FIBRILLATION ABLATION N/A 06/23/2024   Procedure: ATRIAL FIBRILLATION ABLATION;  Surgeon: Inocencio Soyla Lunger, MD;  Location: [facility] INVASIVE CV LAB;  Service: Cardiovascular;  Laterality: N/A;   BUBBLE STUDY  05/04/2021   Procedure: BUBBLE STUDY;  Surgeon: Loni Soyla LABOR, MD;  Location: [facility] ENDOSCOPY;  Service: Cardiovascular;;   CARDIOVERSION N/A 05/04/2021   Procedure: CARDIOVERSION;  Surgeon: Loni Soyla LABOR, MD;  Location: [facility] ENDOSCOPY;  Service: Cardiovascular;  Laterality: N/A;   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 06/08/2015   Procedure: CONVERSION TO TOTAL HIP, ANTERIOR TOTAL HIP;  Surgeon: Toribio Chancy, MD;  Location: [facility] OR;  Service: Orthopedics;  Laterality: Right;   FRACTURE SURGERY     right hip   TEE WITHOUT CARDIOVERSION N/A 05/04/2021   Procedure: TRANSESOPHAGEAL ECHOCARDIOGRAM (TEE);  Surgeon: Loni Soyla LABOR, MD;  Location: [facility] ENDOSCOPY;  Service: Cardiovascular;  Laterality: N/A;   TOTAL HIP ARTHROPLASTY Right 06/08/2015   TUBAL LIGATION     WISDOM TOOTH EXTRACTION      Current Outpatient Medications  Medication Sig Dispense Refill   amLODipine  (NORVASC ) 5 MG tablet Take 1 tablet (5 mg total) by mouth daily. 90 tablet 0   CALCIUM -VITAMIN D PO Take 1 tablet by mouth daily.     diclofenac Sodium (  VOLTAREN) 1 % GEL Apply 1 application  topically daily as needed (pain).     ELIQUIS  5 MG TABS tablet Take 1 tablet (5 mg total) by mouth 2 (two) times daily. 60 tablet 5   Evolocumab  (REPATHA  SURECLICK) 140 MG/ML SOAJ Inject 140 mg into the skin every 14 (fourteen) days. 6 mL 3   metoprolol  succinate (TOPROL -XL) 50 MG 24 hr tablet Take 1 tablet (50 mg total) by mouth daily.  Take with or immediately following a meal. 90 tablet 3   No current facility-administered medications for this encounter.    Allergies  Allergen Reactions   Perflutren Shortness Of Breath    Protein contrast (pain radiating from feet through spine)   Atorvastatin  Other (See Comments)    Joint pain, aches   Other Hives and Swelling    Poppy seeds   Pravastatin Other (See Comments)    Joint pain, aches   Sulfa Antibiotics Hives   Sulfamethoxazole Rash   ROS- All systems are reviewed and negative except as per the HPI above  Physical Exam: Vitals:   09/21/24 1321  BP: 122/60  Pulse: 76  Weight: 116.8 kg  Height: 5' 5 (1.651 m)     Wt Readings from Last 3 Encounters:  09/21/24 116.8 kg  07/21/24 114.6 kg  06/23/24 113.4 kg    Labs: Lab Results  Component Value Date   NA 144 06/02/2024   K 4.1 06/02/2024   CL 106 06/02/2024   CO2 24 06/02/2024   GLUCOSE 94 06/02/2024   BUN 11 06/02/2024   CREATININE 0.52 (L) 06/02/2024   CALCIUM  8.9 06/02/2024   MG 2.1 01/31/2024   Lab Results  Component Value Date   INR 1.01 05/26/2015   Lab Results  Component Value Date   CHOL 143 04/29/2024   HDL 52 04/29/2024   LDLCALC 71 04/29/2024   TRIG 110 04/29/2024   GEN- The patient is well appearing, alert and oriented x 3 today.   Neck - no JVD or carotid bruit noted Lungs- Clear to ausculation bilaterally, normal work of breathing Heart- Regular rate and rhythm, no murmurs, rubs or gallops, PMI not laterally displaced Extremities- no clubbing, cyanosis, or edema Skin - no rash or ecchymosis noted   EKG- Vent. rate 76 BPM PR interval 220 ms QRS duration 126 ms QT/QTcB 406/456 ms P-R-T axes 1 63 35 Sinus rhythm with 1st degree A-V block Non-specific intra-ventricular conduction block Cannot rule out Anteroseptal infarct (cited on or before 21-Jul-2024) Abnormal ECG When compared with ECG of 21-Jul-2024 13:10, Previous ECG is present  ECHO 01/31/24:  1. Left  ventricular ejection fraction, by estimation, is 60 to 65%. The  left ventricle has normal function. The left ventricle has no regional  wall motion abnormalities. Left ventricular diastolic parameters are  consistent with Grade I diastolic  dysfunction (impaired relaxation). 3D and stain not performed.   2. Right ventricular systolic function is normal. The right ventricular  size is normal.   3. The mitral valve is normal in structure. No evidence of mitral valve  regurgitation. No evidence of mitral stenosis.   4. The aortic valve is normal in structure. Aortic valve regurgitation is  not visualized. No aortic stenosis is present.   5. The inferior vena cava is normal in size with greater than 50%  respiratory variability, suggesting right atrial pressure of 3 mmHg.   CHA2DS2-VASc Score = 6  The patient's score is based upon: CHF History: 0 HTN History: 1 Diabetes History: 0  Stroke History: 2 Vascular Disease History: 1 Age Score: 1 Gender Score: 1       ASSESSMENT AND PLAN: Persistent Atrial Fibrillation (ICD10:  I48.19) The patient's CHA2DS2-VASc score is 6, indicating a 9.7% annual risk of stroke.   S/p Afib ablation on 10/17/21. S/p Afib ablation on 06/23/24 by Dr. Inocencio.  Patient is in NSR. She is doing well and we will not make any changes at this time. Continue Toprol  50 mg daily.    Secondary Hypercoagulable State (ICD10:  D68.69) The patient is at significant risk for stroke/thromboembolism based upon her CHA2DS2-VASc Score of 6.  Continue Apixaban  (Eliquis ).  No missed doses.    Follow up with Dr. Inocencio in 6 months.    Dorn Heinrich, PA-C Afib Clinic [facility] [address] [place], [place] [address] [phone]

## 2024-09-23 ENCOUNTER — Other Ambulatory Visit: Payer: Self-pay | Admitting: Family Medicine

## 2024-09-26 DIAGNOSIS — R059 Cough, unspecified: Secondary | ICD-10-CM | POA: Diagnosis not present

## 2024-09-26 DIAGNOSIS — J209 Acute bronchitis, unspecified: Secondary | ICD-10-CM | POA: Diagnosis not present

## 2024-09-26 DIAGNOSIS — R6883 Chills (without fever): Secondary | ICD-10-CM | POA: Diagnosis not present

## 2024-09-26 DIAGNOSIS — R051 Acute cough: Secondary | ICD-10-CM | POA: Diagnosis not present

## 2024-09-26 DIAGNOSIS — R062 Wheezing: Secondary | ICD-10-CM | POA: Diagnosis not present

## 2024-09-26 DIAGNOSIS — B9689 Other specified bacterial agents as the cause of diseases classified elsewhere: Secondary | ICD-10-CM | POA: Diagnosis not present

## 2024-09-26 DIAGNOSIS — J019 Acute sinusitis, unspecified: Secondary | ICD-10-CM | POA: Diagnosis not present

## 2024-10-08 ENCOUNTER — Ambulatory Visit

## 2024-10-29 ENCOUNTER — Encounter: Payer: Self-pay | Admitting: Family Medicine

## 2024-10-29 ENCOUNTER — Ambulatory Visit: Admitting: Family Medicine

## 2024-10-29 DIAGNOSIS — L570 Actinic keratosis: Secondary | ICD-10-CM | POA: Diagnosis not present

## 2024-10-29 DIAGNOSIS — L821 Other seborrheic keratosis: Secondary | ICD-10-CM | POA: Diagnosis not present

## 2024-10-29 DIAGNOSIS — D2239 Melanocytic nevi of other parts of face: Secondary | ICD-10-CM | POA: Diagnosis not present

## 2024-10-29 DIAGNOSIS — D225 Melanocytic nevi of trunk: Secondary | ICD-10-CM | POA: Diagnosis not present

## 2024-10-29 DIAGNOSIS — L814 Other melanin hyperpigmentation: Secondary | ICD-10-CM | POA: Diagnosis not present

## 2024-11-16 ENCOUNTER — Encounter: Payer: Self-pay | Admitting: Family Medicine

## 2024-11-16 ENCOUNTER — Ambulatory Visit (INDEPENDENT_AMBULATORY_CARE_PROVIDER_SITE_OTHER)

## 2024-11-16 ENCOUNTER — Ambulatory Visit: Admitting: Family Medicine

## 2024-11-16 VITALS — BP 126/60 | HR 85 | Temp 97.3°F | Resp 16 | Ht 65.0 in | Wt 253.0 lb

## 2024-11-16 DIAGNOSIS — R7303 Prediabetes: Secondary | ICD-10-CM | POA: Diagnosis not present

## 2024-11-16 DIAGNOSIS — I700 Atherosclerosis of aorta: Secondary | ICD-10-CM

## 2024-11-16 DIAGNOSIS — Z1231 Encounter for screening mammogram for malignant neoplasm of breast: Secondary | ICD-10-CM | POA: Diagnosis not present

## 2024-11-16 DIAGNOSIS — T466X5A Adverse effect of antihyperlipidemic and antiarteriosclerotic drugs, initial encounter: Secondary | ICD-10-CM | POA: Diagnosis not present

## 2024-11-16 DIAGNOSIS — Z23 Encounter for immunization: Secondary | ICD-10-CM

## 2024-11-16 DIAGNOSIS — I10 Essential (primary) hypertension: Secondary | ICD-10-CM | POA: Diagnosis not present

## 2024-11-16 DIAGNOSIS — G720 Drug-induced myopathy: Secondary | ICD-10-CM

## 2024-11-16 DIAGNOSIS — G8194 Hemiplegia, unspecified affecting left nondominant side: Secondary | ICD-10-CM | POA: Diagnosis not present

## 2024-11-16 DIAGNOSIS — E782 Mixed hyperlipidemia: Secondary | ICD-10-CM | POA: Diagnosis not present

## 2024-11-16 DIAGNOSIS — Z0000 Encounter for general adult medical examination without abnormal findings: Secondary | ICD-10-CM | POA: Diagnosis not present

## 2024-11-16 DIAGNOSIS — G4733 Obstructive sleep apnea (adult) (pediatric): Secondary | ICD-10-CM

## 2024-11-16 DIAGNOSIS — E66813 Obesity, class 3: Secondary | ICD-10-CM | POA: Diagnosis not present

## 2024-11-16 DIAGNOSIS — I480 Paroxysmal atrial fibrillation: Secondary | ICD-10-CM | POA: Diagnosis not present

## 2024-11-16 DIAGNOSIS — Z6841 Body Mass Index (BMI) 40.0 and over, adult: Secondary | ICD-10-CM | POA: Diagnosis not present

## 2024-11-16 LAB — COMPREHENSIVE METABOLIC PANEL WITH GFR
ALT: 35 IU/L — ABNORMAL HIGH (ref 0–32)
AST: 24 IU/L (ref 0–40)
Albumin: 4.5 g/dL (ref 3.8–4.8)
Alkaline Phosphatase: 77 IU/L (ref 49–135)
BUN/Creatinine Ratio: 20 (ref 12–28)
BUN: 12 mg/dL (ref 8–27)
Bilirubin Total: 0.4 mg/dL (ref 0.0–1.2)
CO2: 24 mmol/L (ref 20–29)
Calcium: 9.2 mg/dL (ref 8.7–10.3)
Chloride: 104 mmol/L (ref 96–106)
Creatinine, Ser: 0.6 mg/dL (ref 0.57–1.00)
Globulin, Total: 2.3 g/dL (ref 1.5–4.5)
Glucose: 106 mg/dL — ABNORMAL HIGH (ref 70–99)
Potassium: 4.3 mmol/L (ref 3.5–5.2)
Sodium: 142 mmol/L (ref 134–144)
Total Protein: 6.8 g/dL (ref 6.0–8.5)
eGFR: 96 mL/min/1.73

## 2024-11-16 LAB — CBC WITH DIFFERENTIAL/PLATELET
Basophils Absolute: 0 x10E3/uL (ref 0.0–0.2)
Basos: 1 %
EOS (ABSOLUTE): 0.1 x10E3/uL (ref 0.0–0.4)
Eos: 2 %
Hematocrit: 44.5 % (ref 34.0–46.6)
Hemoglobin: 14.2 g/dL (ref 11.1–15.9)
Immature Grans (Abs): 0 x10E3/uL (ref 0.0–0.1)
Immature Granulocytes: 0 %
Lymphocytes Absolute: 1.5 x10E3/uL (ref 0.7–3.1)
Lymphs: 22 %
MCH: 28.3 pg (ref 26.6–33.0)
MCHC: 31.9 g/dL (ref 31.5–35.7)
MCV: 89 fL (ref 79–97)
Monocytes Absolute: 0.5 x10E3/uL (ref 0.1–0.9)
Monocytes: 8 %
Neutrophils Absolute: 4.4 x10E3/uL (ref 1.4–7.0)
Neutrophils: 67 %
Platelets: 210 x10E3/uL (ref 150–450)
RBC: 5.02 x10E6/uL (ref 3.77–5.28)
RDW: 12 % (ref 11.7–15.4)
WBC: 6.6 x10E3/uL (ref 3.4–10.8)

## 2024-11-16 LAB — LIPID PANEL
Chol/HDL Ratio: 2.3 ratio (ref 0.0–4.4)
Cholesterol, Total: 134 mg/dL (ref 100–199)
HDL: 58 mg/dL
LDL Chol Calc (NIH): 58 mg/dL (ref 0–99)
Triglycerides: 94 mg/dL (ref 0–149)
VLDL Cholesterol Cal: 18 mg/dL (ref 5–40)

## 2024-11-16 MED ORDER — REPATHA SURECLICK 140 MG/ML ~~LOC~~ SOAJ: 140.0000 mg | SUBCUTANEOUS | 3 refills | Status: AC

## 2024-11-16 MED ORDER — AMLODIPINE BESYLATE 5 MG PO TABS: 5.0000 mg | ORAL_TABLET | Freq: Every day | ORAL | 1 refills | Status: AC

## 2024-11-16 NOTE — Assessment & Plan Note (Addendum)
 Management per specialist.

## 2024-11-16 NOTE — Assessment & Plan Note (Addendum)
 History of statin intolerance. Currently on Repatha  140 mg subcutaneous injection once every 14 days. Tolerating well. Continue the same.  Orders:   Evolocumab  (REPATHA  SURECLICK) 140 MG/ML SOAJ; Inject 140 mg into the skin every 14 (fourteen) days.   Lipid panel

## 2024-11-16 NOTE — Progress Notes (Signed)
 "  Chief Complaint  Patient presents with   Wellness Visit     Subjective:   Teresa Blair is a 73 y.o. female who presents for a Medicare Annual Wellness Visit.  Visit info / Clinical Intake: Medicare Wellness Visit Type:: Subsequent Annual Wellness Visit Persons participating in visit and providing information:: patient Medicare Wellness Visit Mode:: In-person (required for WTM) Interpreter Needed?: No Pre-visit prep was completed: yes AWV questionnaire completed by patient prior to visit?: no Living arrangements:: (!) lives alone Patient's Overall Health Status Rating: (!) fair Typical amount of pain: (!) a lot Does pain affect daily life?: no Are you currently prescribed opioids?: no  Dietary Habits and Nutritional Risks How many meals a day?: 3 Eats fruit and vegetables daily?: yes Most meals are obtained by: preparing own meals In the last 2 weeks, have you had any of the following?: none Diabetic:: no  Functional Status Activities of Daily Living (to include ambulation/medication): Independent Ambulation: Independent Medication Administration: Independent Home Management (perform basic housework or laundry): Independent Manage your own finances?: yes Primary transportation is: driving Concerns about vision?: no *vision screening is required for WTM* Concerns about hearing?: no  Fall Screening Falls in the past year?: 1 Number of falls in past year: 1 Was there an injury with Fall?: 0 Fall Risk Category Calculator: 2 Patient Fall Risk Level: Moderate Fall Risk  Fall Risk Patient at Risk for Falls Due to: History of fall(s); Impaired mobility; Impaired balance/gait Fall risk Follow up: Falls evaluation completed; Education provided  Home and Transportation Safety: All rugs have non-skid backing?: (!) no All stairs or steps have railings?: N/A, no stairs Grab bars in the bathtub or shower?: yes Have non-skid surface in bathtub or shower?: (!) no Good home  lighting?: yes Regular seat belt use?: yes Hospital stays in the last year:: no  Cognitive Assessment Difficulty concentrating, remembering, or making decisions? : no Will 6CIT or Mini Cog be Completed: yes Which version was used?: Version1 : banana, sunrise, chair  Advance Directives (For Healthcare) Does Patient Have a Medical Advance Directive?: Yes Does patient want to make changes to medical advance directive?: No - Patient declined Type of Advance Directive: Healthcare Power of [place]; Living will Copy of Healthcare Power of Attorney in Chart?: No - copy requested Copy of Living Will in Chart?: No - copy requested  Reviewed/Updated  Reviewed/Updated: Reviewed All (Medical, Surgical, Family, Medications, Allergies, Care Teams, Patient Goals)    Allergies (verified) Perflutren, Sulfa antibiotics, Atorvastatin , Other, Pravastatin, and Sulfamethoxazole   Current Medications (verified) Outpatient Encounter Medications as of 11/16/2024  Medication Sig   amLODipine  (NORVASC ) 5 MG tablet Take 1 tablet (5 mg total) by mouth daily.   CALCIUM -VITAMIN D PO Take 1 tablet by mouth daily.   diclofenac Sodium (VOLTAREN) 1 % GEL Apply 1 application  topically daily as needed (pain).   ELIQUIS  5 MG TABS tablet Take 1 tablet (5 mg total) by mouth 2 (two) times daily.   Evolocumab  (REPATHA  SURECLICK) 140 MG/ML SOAJ Inject 140 mg into the skin every 14 (fourteen) days.   metoprolol  succinate (TOPROL -XL) 50 MG 24 hr tablet Take 1 tablet (50 mg total) by mouth daily. Take with or immediately following a meal.   No facility-administered encounter medications on file as of 11/16/2024.    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) 05/07/2023   History of CVA (cerebrovascular accident) 09/16/2020   Hypercholesterolemia    Hypertension    Mixed hyperlipidemia 09/16/2020   Morbid obesity (HCC) 09/16/2020   Nonspecific abnormal electrocardiogram (ECG) (EKG) 01/31/2024   OSA (obstructive sleep apnea) 03/31/2021   Optimal PAP 14     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   Primary hypertension 09/16/2020   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   Right hip pain 09/03/2011   Scleroderma, localized    Sequelae, 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   Stroke (HCC) 09/12/2020   left side weakness/speech affected   Wears glasses    Past Surgical History:  Procedure Laterality Date   ATRIAL FIBRILLATION ABLATION N/A 10/17/2021   Procedure: ATRIAL FIBRILLATION ABLATION;  Surgeon: Inocencio Soyla Lunger, MD;  Location: [facility] INVASIVE [facility];  Service: Cardiovascular;  Laterality: N/A;   ATRIAL FIBRILLATION ABLATION N/A 06/23/2024   Procedure: ATRIAL FIBRILLATION ABLATION;  Surgeon: Inocencio Soyla Lunger, MD;  Location: [facility];  Service: Cardiovascular;  Laterality: N/A;   BUBBLE STUDY  05/04/2021   Procedure: BUBBLE STUDY;  Surgeon: Loni Soyla LABOR, MD;  Location: [facility] ENDOSCOPY;  Service: Cardiovascular;;   CARDIOVERSION N/A 05/04/2021   Procedure: CARDIOVERSION;  Surgeon: Loni Soyla LABOR, MD;  Location: [facility] ENDOSCOPY;  Service: Cardiovascular;  Laterality: N/A;   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 06/08/2015   Procedure: CONVERSION TO TOTAL HIP, ANTERIOR TOTAL HIP;  Surgeon: Toribio Chancy, MD;  Location: [facility] OR;  Service: Orthopedics;  Laterality: Right;   FRACTURE SURGERY     right hip   TEE WITHOUT CARDIOVERSION N/A 05/04/2021   Procedure: TRANSESOPHAGEAL ECHOCARDIOGRAM (TEE);   Surgeon: Loni Soyla LABOR, MD;  Location: [facility] ENDOSCOPY;  Service: Cardiovascular;  Laterality: N/A;   TOTAL HIP ARTHROPLASTY Right 06/08/2015   TUBAL LIGATION     WISDOM TOOTH EXTRACTION     Family History  Problem Relation Age of Onset   Cancer Mother    Cancer Father    Hypertension Other    Social History   Occupational History    Comment: full time  Tobacco Use   Smoking status: Never   Smokeless tobacco: Never   Tobacco comments:    Never smoked 07/21/24  Vaping Use   Vaping status: Never Used  Substance and Sexual Activity   Alcohol use: No   Drug use: No   Sexual activity: Not Currently   Tobacco Counseling Counseling given: Not Answered Tobacco comments: Never smoked 07/21/24  SDOH Screenings   Food Insecurity: No Food Insecurity (11/16/2024)  Housing: Low Risk (11/16/2024)  Transportation Needs: No Transportation Needs (11/16/2024)  Utilities: Not At Risk (11/16/2024)  Alcohol Screen: Low Risk (10/02/2023)  Depression (PHQ2-9): Low Risk (11/16/2024)  Financial Resource Strain: Low Risk (10/02/2023)  Physical Activity: Insufficiently Active (11/16/2024)  Social Connections: Moderately Integrated (11/16/2024)  Stress: No Stress Concern Present (11/16/2024)  Tobacco Use: Low Risk (11/16/2024)  Health Literacy: Adequate Health Literacy (11/16/2024)   See flowsheets for full screening details  Depression Screen PHQ 2 & 9 Depression Scale- Over the past 2 weeks, how often have you been bothered by any of the following problems? Little  interest or pleasure in doing things: 0 Feeling down, depressed, or hopeless (PHQ Adolescent also includes...irritable): 0 PHQ-2 Total Score: 0 Trouble falling or staying asleep, or sleeping too much: 0 Feeling tired or having little energy: 0 Poor appetite or overeating (PHQ Adolescent also includes...weight loss): 0 Feeling bad about yourself - or that you are a failure or have let yourself or your family down: 0 Trouble  concentrating on things, such as reading the newspaper or watching television (PHQ Adolescent also includes...like school work): 0 Moving or speaking so slowly that other people could have noticed. Or the opposite - being so fidgety or restless that you have been moving around a lot more than usual: 0 Thoughts that you would be better off dead, or of hurting yourself in some way: 0 PHQ-9 Total Score: 0 If you checked off any problems, how difficult have these problems made it for you to do your work, take care of things at home, or get along with other people?: Not difficult at all     Goals Addressed   None          Objective:    Today's Vitals   11/16/24 0935  BP: 126/60  Pulse: 85  Resp: 16  Temp: (!) 97.3 F (36.3 C)  SpO2: 96%  Weight: 253 lb (114.8 kg)  Height: 5' 5 (1.651 m)   Body mass index is 42.1 kg/m.  Hearing/Vision screen No results found. Immunizations and Health Maintenance Health Maintenance  Topic Date Due   Zoster Vaccines- Shingrix (1 of 2) Never done   Influenza Vaccine  06/26/2024   DTaP/Tdap/Td (1 - Tdap) 04/29/2025 (Originally 02/20/1970)   Medicare Annual Wellness (AWV)  11/16/2025   Mammogram  01/20/2026   Fecal DNA (Cologuard)  10/19/2026   Pneumococcal Vaccine: 50+ Years  Completed   Bone Density Scan  Completed   Hepatitis C Screening  Completed   Meningococcal B Vaccine  Aged Out   COVID-19 Vaccine  Discontinued        Assessment/Plan:  This is a routine wellness examination for [place].  Patient Care Team: Sherre Clapper, MD as PCP - General (Family Medicine) Inocencio Soyla Lunger, MD as PCP - Electrophysiology (Cardiology) Madireddy, Alean SAUNDERS, MD as PCP - Cardiology (Cardiology)  I have personally reviewed and noted the following in the patients chart:   Medical and social history Use of alcohol, tobacco or illicit drugs  Current medications and supplements including opioid prescriptions. Functional ability and  status Nutritional status Physical activity Advanced directives List of other physicians Hospitalizations, surgeries, and ER visits in previous 12 months Vitals Screenings to include cognitive, depression, and falls Referrals and appointments  No orders of the defined types were placed in this encounter.  In addition, I have reviewed and discussed with patient certain preventive protocols, quality metrics, and best practice recommendations. A written personalized care plan for preventive services as well as general preventive health recommendations were provided to patient.   Alix LILLETTE Bach, CMA   11/16/2024   Return in 1 year (on 11/16/2025).  After Visit Summary: (In Person-Printed) AVS printed and given to the patient  Nurse Notes: Spent 30 minutes face to face with patient at the office. Flu shot was given at the office. Screening mammogram was ordered Shingles and Tdap was recommended to have it at the pharmacy. "

## 2024-11-16 NOTE — Patient Instructions (Signed)
 Teresa Blair,  Thank you for taking the time for your Medicare Wellness Visit. I appreciate your continued commitment to your health goals. Please review the care plan we discussed, and feel free to reach out if I can assist you further.  Please note that Annual Wellness Visits do not include a physical exam. Some assessments may be limited, especially if the visit was conducted virtually. If needed, we may recommend an in-person follow-up with your provider.  Ongoing Care Seeing your primary care provider every 3 to 6 months helps us  monitor your health and provide consistent, personalized care. Her next appointment is on 05/17/2025  Referrals If a referral was made during today's visit and you haven't received any updates within two weeks, please contact the referred provider directly to check on the status.  Recommended Screenings:  Health Maintenance  Topic Date Due   Zoster (Shingles) Vaccine (1 of 2) Never done   Flu Shot  06/26/2024   Medicare Annual Wellness Visit  10/01/2024   DTaP/Tdap/Td vaccine (1 - Tdap) 04/29/2025*   Breast Cancer Screening  01/20/2026   Cologuard (Stool DNA test)  10/19/2026   Pneumococcal Vaccine for age over 38  Completed   Osteoporosis screening with Bone Density Scan  Completed   Hepatitis C Screening  Completed   Meningitis B Vaccine  Aged Out   COVID-19 Vaccine  Discontinued  *Topic was postponed. The date shown is not the original due date.       11/16/2024    9:37 AM  Advanced Directives  Does Patient Have a Medical Advance Directive? Yes  Type of Estate Agent of [place];Living will  Copy of Healthcare Power of Attorney in Chart? No - copy requested    Vision: Annual vision screenings are recommended for early detection of glaucoma, cataracts, and diabetic retinopathy. These exams can also reveal signs of chronic conditions such as diabetes and high blood pressure.  Dental: Annual dental screenings help detect early  signs of oral cancer, gum disease, and other conditions linked to overall health, including heart disease and diabetes.  Please see the attached documents for additional preventive care recommendations.  Recommended to get shingles and Tdap shot at the pharmacy. Order mammogram.

## 2024-11-16 NOTE — Assessment & Plan Note (Addendum)
Compensating well. Continue eliquis.

## 2024-11-16 NOTE — Assessment & Plan Note (Addendum)
 Intolerant to statins.

## 2024-11-16 NOTE — Assessment & Plan Note (Addendum)
 Continue cpap.

## 2024-11-16 NOTE — Assessment & Plan Note (Addendum)
 Well controlled.  No changes to medicines.  Continue to work on eating a healthy diet and exercise.  Labs drawn today.   Orders:   amLODipine  (NORVASC ) 5 MG tablet; Take 1 tablet (5 mg total) by mouth daily.   CBC with Differential/Platelet   Comprehensive metabolic panel with GFR

## 2024-11-16 NOTE — Assessment & Plan Note (Addendum)
 Continue to work on diet and exercise

## 2024-11-16 NOTE — Assessment & Plan Note (Addendum)
The current medical regimen is effective;  continue present plan and medications. Continue repatha 140 mg every 2 weeks.

## 2024-11-16 NOTE — Progress Notes (Signed)
 "  Subjective:  Patient ID: Teresa Blair, female    DOB: 1951/02/25  Age: 73 y.o. MRN: [id number]  Chief Complaint  Patient presents with   Atrial Fibrillation    HPI: Discussed the use of AI scribe software for clinical note transcription with the patient, who gave verbal consent to proceed.  History of Present Illness Teresa Blair is a 73 year old female with atrial fibrillation and hyperlipidemia who presents for a medication annual wellness visit.  Atrial fibrillation and cardiac symptoms - Atrial fibrillation managed with Eliquis  5 mg twice daily and Toprol  XL 50 mg once daily. - Status post second ablation procedure. - No chest pain or shortness of breath. - No palpitations or irregular heartbeats since ablation.  Hyperlipidemia and statin intolerance - Hyperlipidemia managed with Repatha  for approximately two years due to statin intolerance (muscle pain with statins). - Has not visited lipid clinic recently. - Running low on Repatha ; last dose expected around Christmas, unsure if one or two doses remain.  Hypertension - Blood pressure managed with amlodipine  5 mg daily.  Musculoskeletal pain - Arthritis-related pain managed with diclofenac gel.  Weight management - Weight fluctuates; has lost 20 pounds but occasionally regains some. - Difficulty maintaining a low-fat diet. - Loves to cook and eat.   Recent respiratory illness - Severe respiratory illness in November requiring breathing treatments and chest x-ray. - Described as the worst illness experienced. - No current respiratory symptoms: no cough, fever, or sore throat.  Herpes zoster (shingles) - History of severe shingles approximately 30 years ago. - No shingles vaccine received yet.  General symptoms - No current symptoms of fever, chills, sweats, earache, sore throat, headaches, stuffy nose, chest pain, or shortness of breath.  Supplement use - Takes calcium  with vitamin D  supplements.       11/16/2024    9:40 AM 04/29/2024    7:28 AM 10/30/2023    7:33 AM 10/02/2023    2:43 PM 04/30/2023    7:41 AM  Depression screen PHQ 2/9  Decreased Interest 0 0 0 0 0  Down, Depressed, Hopeless 0 0 0 0 0  PHQ - 2 Score 0 0 0 0 0  Altered sleeping  0 0 0 1  Tired, decreased energy  0 0 0 0  Change in appetite  0 0 0 1  Feeling bad or failure about yourself   0 0 0 0  Trouble concentrating  0 0 0 0  Moving slowly or fidgety/restless  0 0 0 0  Suicidal thoughts  0 0 0 0  PHQ-9 Score  0  0  0  2   Difficult doing work/chores  Not difficult at all Not difficult at all Not difficult at all Not difficult at all     Data saved with a previous flowsheet row definition        11/16/2024    9:37 AM  Fall Risk   Falls in the past year? 1  Number falls in past yr: 1  Injury with Fall? 0  Risk for fall due to : History of fall(s);Impaired mobility;Impaired balance/gait  Follow up Falls evaluation completed;Education provided    Patient Care Team: Sherre Clapper, MD as PCP - General (Family Medicine) Inocencio Soyla Lunger, MD as PCP - Electrophysiology (Cardiology) Madireddy, Alean SAUNDERS, MD as PCP - Cardiology (Cardiology)   Review of Systems  Constitutional:  Negative for chills, fatigue and fever.  HENT:  Negative for congestion, ear pain and sore  throat.   Respiratory:  Negative for cough and shortness of breath.   Cardiovascular:  Negative for chest pain.  Gastrointestinal:  Negative for abdominal pain, constipation, diarrhea, nausea and vomiting.  Genitourinary:  Negative for dysuria and urgency.  Musculoskeletal:  Negative for arthralgias and myalgias.  Skin:  Negative for rash.  Neurological:  Negative for dizziness and headaches.  Psychiatric/Behavioral:  Negative for dysphoric mood. The patient is not nervous/anxious.     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) 05/07/2023   History of CVA (cerebrovascular accident) 09/16/2020   Hypercholesterolemia    Hypertension    Mixed hyperlipidemia 09/16/2020   Morbid obesity (HCC) 09/16/2020   Nonspecific abnormal electrocardiogram (ECG) (EKG) 01/31/2024   OSA (obstructive sleep apnea) 03/31/2021   Optimal PAP 14     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   Primary hypertension 09/16/2020   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   Right hip pain 09/03/2011   Scleroderma, localized    Sequelae, 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   Stroke (HCC) 09/12/2020   left side weakness/speech affected   Wears glasses    Past Surgical History:  Procedure Laterality Date   ATRIAL FIBRILLATION ABLATION N/A 10/17/2021   Procedure: ATRIAL FIBRILLATION ABLATION;  Surgeon: Inocencio Soyla Lunger, MD;  Location: [facility];  Service: Cardiovascular;  Laterality: N/A;   ATRIAL FIBRILLATION ABLATION N/A 06/23/2024   Procedure: ATRIAL FIBRILLATION ABLATION;  Surgeon: Inocencio Soyla Lunger, MD;  Location: [facility];  Service: Cardiovascular;  Laterality: N/A;   BUBBLE STUDY  05/04/2021   Procedure: BUBBLE STUDY;  Surgeon: Loni Soyla LABOR, MD;  Location: [facility] ENDOSCOPY;  Service: Cardiovascular;;   CARDIOVERSION N/A 05/04/2021   Procedure: CARDIOVERSION;  Surgeon: Loni Soyla LABOR, MD;  Location: [facility] ENDOSCOPY;  Service: Cardiovascular;  Laterality: N/A;   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 06/08/2015   Procedure: CONVERSION TO TOTAL HIP, ANTERIOR TOTAL HIP;  Surgeon:  Toribio Chancy, MD;  Location: [facility] OR;  Service: Orthopedics;  Laterality: Right;   FRACTURE SURGERY     right hip   TEE WITHOUT CARDIOVERSION N/A 05/04/2021   Procedure: TRANSESOPHAGEAL ECHOCARDIOGRAM (TEE);  Surgeon: Loni Soyla LABOR, MD;  Location: [facility] ENDOSCOPY;  Service: Cardiovascular;  Laterality: N/A;   TOTAL HIP ARTHROPLASTY Right 06/08/2015   TUBAL LIGATION     WISDOM TOOTH EXTRACTION      Family History  Problem Relation Age of Onset   Cancer Mother    Cancer Father    Hypertension Other    Social History   Socioeconomic History   Marital status: Divorced    Spouse name: Not on file   Number of children: 3   Years of education: Not on file   Highest education level: Not on file  Occupational History    Comment: full time  Tobacco Use   Smoking status: Never   Smokeless tobacco: Never   Tobacco comments:    Never smoked 07/21/24  Vaping Use  Vaping status: Never Used  Substance and Sexual Activity   Alcohol use: No   Drug use: No   Sexual activity: Not Currently  Other Topics Concern   Not on file  Social History Narrative   Lives alone   Right Handed   Drinks no caffeine   Social Drivers of Health   Tobacco Use: Low Risk (11/16/2024)   Patient History    Smoking Tobacco Use: Never    Smokeless Tobacco Use: Never    Passive Exposure: Not on file  Financial Resource Strain: Low Risk (10/02/2023)   Overall Financial Resource Strain (CARDIA)    Difficulty of Paying Living Expenses: Not hard at all  Food Insecurity: No Food Insecurity (11/16/2024)   Epic    Worried About Radiation Protection Practitioner of Food in the Last Year: Never true    Ran Out of Food in the Last Year: Never true  Transportation Needs: No Transportation Needs (11/16/2024)   Epic    Lack of Transportation (Medical): No    Lack of Transportation (Non-Medical): No  Physical Activity: Insufficiently Active (11/16/2024)   Exercise Vital Sign    Days of Exercise per Week: 7 days    Minutes of Exercise  per Session: 10 min  Stress: No Stress Concern Present (11/16/2024)   [employer] of Occupational Health - Occupational Stress Questionnaire    Feeling of Stress: Only a little  Social Connections: Moderately Integrated (11/16/2024)   Social Connection and Isolation Panel    Frequency of Communication with Friends and Family: More than three times a week    Frequency of Social Gatherings with Friends and Family: More than three times a week    Attends Religious Services: More than 4 times per year    Active Member of Clubs or Organizations: Yes    Attends Banker Meetings: More than 4 times per year    Marital Status: Divorced  Depression (PHQ2-9): Low Risk (11/16/2024)   Depression (PHQ2-9)    PHQ-2 Score: 0  Alcohol Screen: Low Risk (10/02/2023)   Alcohol Screen    Last Alcohol Screening Score (AUDIT): 0  Housing: Low Risk (11/16/2024)   Epic    Unable to Pay for Housing in the Last Year: No    Number of Times Moved in the Last Year: 0    Homeless in the Last Year: No  Utilities: Not At Risk (11/16/2024)   Epic    Threatened with loss of utilities: No  Health Literacy: Adequate Health Literacy (11/16/2024)   B1300 Health Literacy    Frequency of need for help with medical instructions: Never    Objective:  BP 126/60   Pulse 85   Temp (!) 97.3 F (36.3 C)   Resp 16   Ht 5' 5 (1.651 m)   Wt 253 lb (114.8 kg)   LMP 02/25/2015   SpO2 96%   BMI 42.10 kg/m      11/16/2024    9:35 AM 11/16/2024    8:30 AM 09/21/2024    1:21 PM  BP/Weight  Systolic BP 126 126 122  Diastolic BP 60 60 60  Wt. (Lbs) 253 253 257.4  BMI 42.1 kg/m2 42.1 kg/m2 42.83 kg/m2    Physical Exam Vitals reviewed.  Constitutional:      Appearance: Normal appearance. She is obese.  Neck:     Vascular: No carotid bruit.  Cardiovascular:     Rate and Rhythm: Normal rate and regular rhythm.     Heart sounds: Normal heart sounds.  Pulmonary:     Effort: Pulmonary effort is  normal. No respiratory distress.     Breath sounds: Normal breath sounds.  Abdominal:     General: Abdomen is flat. Bowel sounds are normal.     Palpations: Abdomen is soft.     Tenderness: There is no abdominal tenderness.  Neurological:     Mental Status: She is alert and oriented to person, place, and time.  Psychiatric:        Mood and Affect: Mood normal.        Behavior: Behavior normal.         Lab Results  Component Value Date   WBC 6.6 11/16/2024   HGB 14.2 11/16/2024   HCT 44.5 11/16/2024   PLT 210 11/16/2024   GLUCOSE 106 (H) 11/16/2024   CHOL 134 11/16/2024   TRIG 94 11/16/2024   HDL 58 11/16/2024   LDLCALC 58 11/16/2024   ALT 35 (H) 11/16/2024   AST 24 11/16/2024   NA 142 11/16/2024   K 4.3 11/16/2024   CL 104 11/16/2024   CREATININE 0.60 11/16/2024   BUN 12 11/16/2024   CO2 24 11/16/2024   TSH 0.813 01/31/2024   INR 1.01 05/26/2015   HGBA1C 5.7 (H) 11/16/2024    Results for orders placed or performed in visit on 11/16/24  CBC with Differential/Platelet   Collection Time: 11/16/24  9:26 AM  Result Value Ref Range   WBC 6.6 3.4 - 10.8 x10E3/uL   RBC 5.02 3.77 - 5.28 x10E6/uL   Hemoglobin 14.2 11.1 - 15.9 g/dL   Hematocrit 55.4 65.9 - 46.6 %   MCV 89 79 - 97 fL   MCH 28.3 26.6 - 33.0 pg   MCHC 31.9 31.5 - 35.7 g/dL   RDW 87.9 88.2 - 84.5 %   Platelets 210 150 - 450 x10E3/uL   Neutrophils 67 Not Estab. %   Lymphs 22 Not Estab. %   Monocytes 8 Not Estab. %   Eos 2 Not Estab. %   Basos 1 Not Estab. %   Neutrophils Absolute 4.4 1.4 - 7.0 x10E3/uL   Lymphocytes Absolute 1.5 0.7 - 3.1 x10E3/uL   Monocytes Absolute 0.5 0.1 - 0.9 x10E3/uL   EOS (ABSOLUTE) 0.1 0.0 - 0.4 x10E3/uL   Basophils Absolute 0.0 0.0 - 0.2 x10E3/uL   Immature Granulocytes 0 Not Estab. %   Immature Grans (Abs) 0.0 0.0 - 0.1 x10E3/uL  Comprehensive metabolic panel with GFR   Collection Time: 11/16/24  9:26 AM  Result Value Ref Range   Glucose 106 (H) 70 - 99 mg/dL   BUN 12  8 - 27 mg/dL   Creatinine, Ser 9.39 0.57 - 1.00 mg/dL   eGFR 96 >40 fO/fpw/8.26   BUN/Creatinine Ratio 20 12 - 28   Sodium 142 134 - 144 mmol/L   Potassium 4.3 3.5 - 5.2 mmol/L   Chloride 104 96 - 106 mmol/L   CO2 24 20 - 29 mmol/L   Calcium  9.2 8.7 - 10.3 mg/dL   Total Protein 6.8 6.0 - 8.5 g/dL   Albumin 4.5 3.8 - 4.8 g/dL   Globulin, Total 2.3 1.5 - 4.5 g/dL   Bilirubin Total 0.4 0.0 - 1.2 mg/dL   Alkaline Phosphatase 77 49 - 135 IU/L   AST 24 0 - 40 IU/L   ALT 35 (H) 0 - 32 IU/L  Lipid panel   Collection Time: 11/16/24  9:26 AM  Result Value Ref Range   Cholesterol, Total 134 100 - 199 mg/dL  Triglycerides 94 0 - 149 mg/dL   HDL 58 >60 mg/dL   VLDL Cholesterol Cal 18 5 - 40 mg/dL   LDL Chol Calc (NIH) 58 0 - 99 mg/dL   Chol/HDL Ratio 2.3 0.0 - 4.4 ratio  Hemoglobin A1c   Collection Time: 11/16/24  9:26 AM  Result Value Ref Range   Hgb A1c MFr Bld 5.7 (H) 4.8 - 5.6 %   Est. average glucose Bld gHb Est-mCnc 117 mg/dL  Specimen status report   Collection Time: 11/16/24  9:26 AM  Result Value Ref Range   specimen status report Comment   .  Assessment & Plan:   Assessment & Plan Primary hypertension Well controlled.  No changes to medicines.  Continue to work on eating a healthy diet and exercise.  Labs drawn today.   Orders:   amLODipine  (NORVASC ) 5 MG tablet; Take 1 tablet (5 mg total) by mouth daily.   CBC with Differential/Platelet   Comprehensive metabolic panel with GFR   Mixed hyperlipidemia History of statin intolerance. Currently on Repatha  140 mg subcutaneous injection once every 14 days. Tolerating well. Continue the same.  Orders:   Evolocumab  (REPATHA  SURECLICK) 140 MG/ML SOAJ; Inject 140 mg into the skin every 14 (fourteen) days.   Lipid panel   Aortic atherosclerosis The current medical regimen is effective;  continue present plan and medications. Continue repatha  140 mg every 2 weeks.      Hemiparesis of left nondominant side due to  cerebrovascular disease (HCC) Compensating well. Continue eliquis .      Class 3 severe obesity due to excess calories with serious comorbidity and body mass index (BMI) of 40.0 to 44.9 in adult [facility]) Continue to work on diet and exercise.       OSA (obstructive sleep apnea) Continue cpap.      Paroxysmal atrial fibrillation (HCC) Management per specialist     Statin myopathy Intolerant to statins.     Prediabetes Check A1c. Came back as 5.7. New diagnosis.       Body mass index is 42.1 kg/m.   Meds ordered this encounter  Medications   amLODipine  (NORVASC ) 5 MG tablet    Sig: Take 1 tablet (5 mg total) by mouth daily.    Dispense:  90 tablet    Refill:  1   Evolocumab  (REPATHA  SURECLICK) 140 MG/ML SOAJ    Sig: Inject 140 mg into the skin every 14 (fourteen) days.    Dispense:  6 mL    Refill:  3    Orders Placed This Encounter  Procedures   CBC with Differential/Platelet   Comprehensive metabolic panel with GFR   Lipid panel   Hemoglobin A1c   Specimen status report       Follow-up: Return in about 6 months (around 05/17/2025) for chronic follow up.  An After Visit Summary was printed and given to the patient.  Abigail Free, MD [facility] [phone]     [1]  Current Outpatient Medications on File Prior to Visit  Medication Sig Dispense Refill   CALCIUM -VITAMIN D PO Take 1 tablet by mouth daily.     diclofenac Sodium (VOLTAREN) 1 % GEL Apply 1 application  topically daily as needed (pain).     ELIQUIS  5 MG TABS tablet Take 1 tablet (5 mg total) by mouth 2 (two) times daily. 60 tablet 5   metoprolol  succinate (TOPROL -XL) 50 MG 24 hr tablet Take 1 tablet (50 mg total) by mouth daily. Take with or immediately following  a meal. 90 tablet 3   No current facility-administered medications on file prior to visit.   "

## 2024-11-18 ENCOUNTER — Ambulatory Visit: Payer: Self-pay

## 2024-11-18 LAB — HEMOGLOBIN A1C
Est. average glucose Bld gHb Est-mCnc: 117 mg/dL
Hgb A1c MFr Bld: 5.7 % — ABNORMAL HIGH (ref 4.8–5.6)

## 2024-11-18 LAB — SPECIMEN STATUS REPORT

## 2024-11-19 ENCOUNTER — Encounter: Payer: Self-pay | Admitting: Family Medicine

## 2024-11-19 DIAGNOSIS — R7303 Prediabetes: Secondary | ICD-10-CM | POA: Insufficient documentation

## 2024-11-20 NOTE — Assessment & Plan Note (Signed)
 Check A1c. Came back as 5.7. New diagnosis.

## 2024-12-27 ENCOUNTER — Other Ambulatory Visit: Payer: Self-pay | Admitting: Cardiology

## 2024-12-27 DIAGNOSIS — I4819 Other persistent atrial fibrillation: Secondary | ICD-10-CM

## 2024-12-28 NOTE — Telephone Encounter (Signed)
 Eliquis  5mg  refill request received. Patient is 74 years old, weight-114.8kg, Crea-0.60 on 11/16/24, Diagnosis-Afib, and last seen by Fairy Heinrich  on 09/21/24. Dose is appropriate based on dosing criteria. Will send in refill to requested pharmacy.

## 2025-01-21 ENCOUNTER — Ambulatory Visit (HOSPITAL_BASED_OUTPATIENT_CLINIC_OR_DEPARTMENT_OTHER): Admitting: Radiology

## 2025-05-17 ENCOUNTER — Ambulatory Visit: Admitting: Family Medicine
